# Patient Record
Sex: Female | Born: 1950 | Race: White | Hispanic: No | Marital: Married | Smoking: Never smoker
Health system: Southern US, Community
[De-identification: ages and names within clinical notes are randomized; demographics above are authoritative.]

## PROBLEM LIST (undated history)

## (undated) DIAGNOSIS — E785 Hyperlipidemia, unspecified: Secondary | ICD-10-CM

## (undated) DIAGNOSIS — F329 Major depressive disorder, single episode, unspecified: Secondary | ICD-10-CM

## (undated) DIAGNOSIS — F419 Anxiety disorder, unspecified: Secondary | ICD-10-CM

## (undated) DIAGNOSIS — M1990 Unspecified osteoarthritis, unspecified site: Secondary | ICD-10-CM

## (undated) DIAGNOSIS — L719 Rosacea, unspecified: Secondary | ICD-10-CM

## (undated) DIAGNOSIS — F32A Depression, unspecified: Secondary | ICD-10-CM

## (undated) DIAGNOSIS — C4491 Basal cell carcinoma of skin, unspecified: Secondary | ICD-10-CM

## (undated) DIAGNOSIS — E079 Disorder of thyroid, unspecified: Secondary | ICD-10-CM

## (undated) DIAGNOSIS — T7840XA Allergy, unspecified, initial encounter: Secondary | ICD-10-CM

## (undated) HISTORY — DX: Anxiety disorder, unspecified: F41.9

## (undated) HISTORY — PX: CERVICAL FUSION: SHX112

## (undated) HISTORY — DX: Depression, unspecified: F32.A

## (undated) HISTORY — DX: Rosacea, unspecified: L71.9

## (undated) HISTORY — PX: TUBAL LIGATION: SHX77

## (undated) HISTORY — DX: Unspecified osteoarthritis, unspecified site: M19.90

## (undated) HISTORY — PX: EYE SURGERY: SHX253

## (undated) HISTORY — DX: Disorder of thyroid, unspecified: E07.9

## (undated) HISTORY — PX: SPINE SURGERY: SHX786

## (undated) HISTORY — DX: Basal cell carcinoma of skin, unspecified: C44.91

## (undated) HISTORY — PX: COSMETIC SURGERY: SHX468

## (undated) HISTORY — DX: Hyperlipidemia, unspecified: E78.5

## (undated) HISTORY — DX: Allergy, unspecified, initial encounter: T78.40XA

## (undated) HISTORY — PX: BREAST BIOPSY: SHX20

## (undated) HISTORY — DX: Major depressive disorder, single episode, unspecified: F32.9

## (undated) HISTORY — PX: BREAST EXCISIONAL BIOPSY: SUR124

---
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
## 1990-12-11 HISTORY — PX: ABDOMINAL HYSTERECTOMY: SHX81

## 2000-07-28 ENCOUNTER — Emergency Department (HOSPITAL_COMMUNITY): Admission: EM | Admit: 2000-07-28 | Discharge: 2000-07-28 | Payer: Self-pay

## 2000-08-04 ENCOUNTER — Emergency Department (HOSPITAL_COMMUNITY): Admission: EM | Admit: 2000-08-04 | Discharge: 2000-08-04 | Payer: Self-pay | Admitting: Emergency Medicine

## 2001-04-24 ENCOUNTER — Encounter: Admission: RE | Admit: 2001-04-24 | Discharge: 2001-04-24 | Payer: Self-pay | Admitting: Family Medicine

## 2001-04-24 ENCOUNTER — Encounter: Payer: Self-pay | Admitting: Family Medicine

## 2001-04-30 ENCOUNTER — Encounter: Payer: Self-pay | Admitting: Family Medicine

## 2001-04-30 ENCOUNTER — Encounter: Admission: RE | Admit: 2001-04-30 | Discharge: 2001-04-30 | Payer: Self-pay | Admitting: Family Medicine

## 2001-05-27 ENCOUNTER — Encounter (INDEPENDENT_AMBULATORY_CARE_PROVIDER_SITE_OTHER): Payer: Self-pay | Admitting: Specialist

## 2001-05-27 ENCOUNTER — Ambulatory Visit (HOSPITAL_BASED_OUTPATIENT_CLINIC_OR_DEPARTMENT_OTHER): Admission: RE | Admit: 2001-05-27 | Discharge: 2001-05-27 | Payer: Self-pay | Admitting: Surgery

## 2003-03-04 ENCOUNTER — Encounter: Payer: Self-pay | Admitting: Family Medicine

## 2003-03-04 ENCOUNTER — Encounter: Admission: RE | Admit: 2003-03-04 | Discharge: 2003-03-04 | Payer: Self-pay | Admitting: Family Medicine

## 2004-03-03 ENCOUNTER — Encounter: Admission: RE | Admit: 2004-03-03 | Discharge: 2004-03-03 | Payer: Self-pay | Admitting: Family Medicine

## 2004-03-07 ENCOUNTER — Encounter: Admission: RE | Admit: 2004-03-07 | Discharge: 2004-03-07 | Payer: Self-pay | Admitting: Family Medicine

## 2004-11-25 ENCOUNTER — Ambulatory Visit (HOSPITAL_COMMUNITY): Admission: RE | Admit: 2004-11-25 | Discharge: 2004-11-26 | Payer: Self-pay | Admitting: Neurosurgery

## 2006-04-03 ENCOUNTER — Other Ambulatory Visit: Admission: RE | Admit: 2006-04-03 | Discharge: 2006-04-03 | Payer: Self-pay | Admitting: Family Medicine

## 2006-04-19 ENCOUNTER — Encounter: Admission: RE | Admit: 2006-04-19 | Discharge: 2006-04-19 | Payer: Self-pay | Admitting: Family Medicine

## 2007-05-20 ENCOUNTER — Encounter: Admission: RE | Admit: 2007-05-20 | Discharge: 2007-05-20 | Payer: Self-pay | Admitting: Family Medicine

## 2007-08-07 ENCOUNTER — Other Ambulatory Visit: Admission: RE | Admit: 2007-08-07 | Discharge: 2007-08-07 | Payer: Self-pay | Admitting: Family Medicine

## 2008-06-11 ENCOUNTER — Encounter: Admission: RE | Admit: 2008-06-11 | Discharge: 2008-06-11 | Payer: Self-pay | Admitting: Family Medicine

## 2008-06-23 ENCOUNTER — Encounter: Admission: RE | Admit: 2008-06-23 | Discharge: 2008-06-23 | Payer: Self-pay | Admitting: Family Medicine

## 2009-01-05 ENCOUNTER — Encounter: Admission: RE | Admit: 2009-01-05 | Discharge: 2009-01-05 | Payer: Self-pay | Admitting: Family Medicine

## 2009-01-25 ENCOUNTER — Encounter: Admission: RE | Admit: 2009-01-25 | Discharge: 2009-01-25 | Payer: Self-pay | Admitting: Family Medicine

## 2009-05-26 ENCOUNTER — Other Ambulatory Visit: Admission: RE | Admit: 2009-05-26 | Discharge: 2009-05-26 | Payer: Self-pay | Admitting: Family Medicine

## 2009-07-30 ENCOUNTER — Encounter: Admission: RE | Admit: 2009-07-30 | Discharge: 2009-07-30 | Payer: Self-pay | Admitting: Family Medicine

## 2010-04-29 ENCOUNTER — Ambulatory Visit (HOSPITAL_COMMUNITY): Admission: RE | Admit: 2010-04-29 | Discharge: 2010-04-30 | Payer: Self-pay | Admitting: Neurosurgery

## 2010-08-01 ENCOUNTER — Encounter: Admission: RE | Admit: 2010-08-01 | Discharge: 2010-08-01 | Payer: Self-pay | Admitting: Family Medicine

## 2010-08-10 ENCOUNTER — Encounter: Admission: RE | Admit: 2010-08-10 | Discharge: 2010-08-10 | Payer: Self-pay | Admitting: Family Medicine

## 2011-01-01 ENCOUNTER — Encounter: Payer: Self-pay | Admitting: Family Medicine

## 2011-02-27 LAB — CBC
HCT: 40.2 % (ref 36.0–46.0)
Hemoglobin: 14 g/dL (ref 12.0–15.0)
MCHC: 34.9 g/dL (ref 30.0–36.0)
RDW: 13.3 % (ref 11.5–15.5)

## 2011-02-27 LAB — URINALYSIS, ROUTINE W REFLEX MICROSCOPIC
Bilirubin Urine: NEGATIVE
Hgb urine dipstick: NEGATIVE
Ketones, ur: NEGATIVE mg/dL
Protein, ur: NEGATIVE mg/dL
Urobilinogen, UA: 1 mg/dL (ref 0.0–1.0)

## 2011-02-27 LAB — URINE MICROSCOPIC-ADD ON

## 2011-02-27 LAB — SURGICAL PCR SCREEN
MRSA, PCR: NEGATIVE
Staphylococcus aureus: NEGATIVE

## 2011-04-28 NOTE — Op Note (Signed)
[name], [name]                ACCOUNT NO.:  [id number]   MEDICAL RECORD NO.:  [id number]          PATIENT TYPE:  OIB   LOCATION:  3005                         FACILITY:  [facility]   PHYSICIAN:  Coletta Memos, M.D.     DATE OF BIRTH:  05/03/1951   DATE OF PROCEDURE:  11/25/2004  DATE OF DISCHARGE:  11/26/2004                                 OPERATIVE REPORT   PREOPERATIVE DIAGNOSES:  1.  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  2.  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  3.  Cervical radiculopathy.   POSTOPERATIVE DIAGNOSES:  1.  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  2.  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  3.  Cervical radiculopathy.   PROCEDURE:  Anterior cervical decompression, C4-5, C5-6.  Arthrodesis, C5-6,  with 6 mm allograft x2 on DBX putty and anterior plating 30 mm _________  plate.  Two screws in C5, 6, and 4.   SURGEON:  Coletta Memos, M.D.   ASSISTANT:  Hewitt Shorts, M.D.   ANESTHESIA:  General endotracheal.   INDICATIONS:  [name] is a 60 year old woman who has had severe pain in  the left upper extremity along with weakness initially, which has improved,  but the pain has persisted.  I followed her for approximately a year and a  half, and she at this point would like to proceed with an operation.  I have  recommended, and she has agreed to undergo an ACDF at C4-5 and C5-6.  She is  hypermobile at C4-5, is not unstable, but she certainly has some ligamentous  embarrassment at that level, and C5-6 is certainly spondylotic.   OPERATIVE NOTE:  [name] was brought to the operating room, intubated,  and placed under general anesthetic without difficulty.  She is positioned  with her head neutral on a head rest.  Her neck was prepped, and she was  draped in a sterile fashion.  I infiltrated with 0.5% lidocaine and 1:200  strength epinephrine of about 3 cc into the proposed incision and started  from the midline and the medial border of the left sternocleidomastoid.  I  opened with a  #10 blade and took this down to the platysma.  I then divided  the platysma in a horizontal fashion.  I opened in an avascular plane to the  anterior cervical spine.  After exposing it, placed a spinal needle.  It  appeared the needle was at C4-5.  I then opened the disk space of C5-6 and  at C4-5.  I placed the traction pins, one at C5 and the other at C6.  I  distracted the disk space and proceeded with a diskectomy.  I completed the  diskectomy and used a high-speed drill to drill away some osteophytes and  open up the neural foramen, especially on the left side, but the right side  also.  When I had achieved decompression with both C6 nerve roots, I then  placed a 6 mm allograft.  I then removed the distraction pin at C6 and  placed it at C4.  I opened the disk space with a #15 blade, removed the disk  material,  went to grasp the fascia, using curettes, pituitary rongeurs, and  Harrison punches.  I used a high-speed drill to remove some very minor  osteophytes.  I then cleared this out with Dr. Earl Gala assistance.  The  spinal cord was well decompressed.  At C4-5, really just wanted to achieve a  good surface perfusion.  I then placed a 6 mm allograft at that level.  I  then with Dr. Earl Gala assistance, placed a two-level plate, two screws at  C5, two at C4, two at C6.  Each screw was placed first by drilling a hole  and then using tap and screws, ___________ plate 30 mm, 14 mm screws used at  each site.  Actually showed the plate, screws, and plugs to be in good  position.  Then irrigated the wound.  Then closed the wound in a layered  fascia using Vicryl sutures, clear Dermabond used for sterile dressing.  The  patient tolerated the procedure well.       KC/MEDQ  D:  11/25/2004  T:  11/27/2004  Job:  [id number]

## 2013-09-11 ENCOUNTER — Ambulatory Visit: Payer: Self-pay | Admitting: Otolaryngology

## 2014-07-30 ENCOUNTER — Ambulatory Visit (INDEPENDENT_AMBULATORY_CARE_PROVIDER_SITE_OTHER): Payer: Managed Care, Other (non HMO) | Admitting: Internal Medicine

## 2014-07-30 ENCOUNTER — Encounter: Payer: Self-pay | Admitting: Internal Medicine

## 2014-07-30 VITALS — BP 106/66 | HR 65 | Temp 98.6°F | Ht 66.0 in | Wt 151.0 lb

## 2014-07-30 DIAGNOSIS — F329 Major depressive disorder, single episode, unspecified: Secondary | ICD-10-CM

## 2014-07-30 DIAGNOSIS — F32A Depression, unspecified: Secondary | ICD-10-CM

## 2014-07-30 DIAGNOSIS — F341 Dysthymic disorder: Secondary | ICD-10-CM

## 2014-07-30 DIAGNOSIS — F419 Anxiety disorder, unspecified: Secondary | ICD-10-CM

## 2014-07-30 DIAGNOSIS — E039 Hypothyroidism, unspecified: Secondary | ICD-10-CM

## 2014-07-30 MED ORDER — LEVOTHYROXINE SODIUM 88 MCG PO TABS: 88.0000 ug | ORAL_TABLET | Freq: Every day | ORAL | Status: DC

## 2014-07-30 NOTE — Patient Instructions (Addendum)
Hypothyroidism The thyroid is a large gland located in the lower front of your neck. The thyroid gland helps control metabolism. Metabolism is how your body handles food. It controls metabolism with the hormone thyroxine. When this gland is underactive (hypothyroid), it produces too little hormone.  CAUSES These include:   Absence or destruction of thyroid tissue.  Goiter due to iodine deficiency.  Goiter due to medications.  Congenital defects (since birth).  Problems with the pituitary. This causes a lack of TSH (thyroid stimulating hormone). This hormone tells the thyroid to turn out more hormone. SYMPTOMS  Lethargy (feeling as though you have no energy)  Cold intolerance  Weight gain (in spite of normal food intake)  Dry skin  Coarse hair  Menstrual irregularity (if severe, may lead to infertility)  Slowing of thought processes Cardiac problems are also caused by insufficient amounts of thyroid hormone. Hypothyroidism in the newborn is cretinism, and is an extreme form. It is important that this form be treated adequately and immediately or it will lead rapidly to retarded physical and mental development. DIAGNOSIS  To prove hypothyroidism, your caregiver may do blood tests and ultrasound tests. Sometimes the signs are hidden. It may be necessary for your caregiver to watch this illness with blood tests either before or after diagnosis and treatment. TREATMENT  Low levels of thyroid hormone are increased by using synthetic thyroid hormone. This is a safe, effective treatment. It usually takes about four weeks to gain the full effects of the medication. After you have the full effect of the medication, it will generally take another four weeks for problems to leave. Your caregiver may start you on low doses. If you have had heart problems the dose may be gradually increased. It is generally not an emergency to get rapidly to normal. HOME CARE INSTRUCTIONS   Take your  medications as your caregiver suggests. Let your caregiver know of any medications you are taking or start taking. Your caregiver will help you with dosage schedules.  As your condition improves, your dosage needs may increase. It will be necessary to have continuing blood tests as suggested by your caregiver.  Report all suspected medication side effects to your caregiver. SEEK MEDICAL CARE IF: Seek medical care if you develop:  Sweating.  Tremulousness (tremors).  Anxiety.  Rapid weight loss.  Heat intolerance.  Emotional swings.  Diarrhea.  Weakness. SEEK IMMEDIATE MEDICAL CARE IF:  You develop chest pain, an irregular heart beat (palpitations), or a rapid heart beat. MAKE SURE YOU:   Understand these instructions.  Will watch your condition.  Will get help right away if you are not doing well or get worse. Document Released: 11/27/2005 Document Revised: 02/19/2012 Document Reviewed: 07/17/2008 Exit[employer] Patient Information 2015 [employer]. This information is not intended to replace advice given to you by your health care provider. Make sure you discuss any questions you have with your health care provider.  

## 2014-07-30 NOTE — Progress Notes (Signed)
Pre visit review using our clinic review tool, if applicable. No additional management support is needed unless otherwise documented below in the visit note. 

## 2014-07-30 NOTE — Assessment & Plan Note (Signed)
Well controlled on current dose of synthroid Will refill medication today  RTC in 6 months to recheck TSH

## 2014-07-30 NOTE — Assessment & Plan Note (Signed)
Well controlled She thinks she is ready to wean down some of her medications but wants to wait until she is out of her refills Rare clonazepam use

## 2014-07-30 NOTE — Progress Notes (Signed)
HPI  Pt presents to the clinic today establish care. She is transferring care from Dr. Jonny Ruiz. She has no concerns today.  Flu: never Tetanus: < 10 years ago Zostovax: never Pap Smear: 1992- hysterectomy Mammogram: > 2 years ago Colon Screening: unsure, within the last 71 years-[facility]: yearly- corneal lesions (chronic keratitis) Dentist: biannually  Past Medical History  Diagnosis Date  . Depression   . Thyroid disease     Current Outpatient Prescriptions  Medication Sig Dispense Refill  . acyclovir (ZOVIRAX) 200 MG capsule Take 200 mg by mouth as needed.      Marland Kitchen buPROPion (WELLBUTRIN XL) 300 MG 24 hr tablet Take 300 mg by mouth daily.      . clonazePAM (KLONOPIN) 0.5 MG tablet Take 0.5 mg by mouth as needed for anxiety.      Marland Kitchen FLUoxetine (PROZAC) 40 MG capsule Take 40 mg by mouth daily.      Marland Kitchen levothyroxine (SYNTHROID, LEVOTHROID) 88 MCG tablet Take 88 mcg by mouth daily before breakfast.       No current facility-administered medications for this visit.    Allergies  Allergen Reactions  . Codeine Hives    Family History  Problem Relation Age of Onset  . Cancer Mother     Uterine, Ovarian  . Heart disease Father   . Stroke Father   . Diabetes Father   . Cancer Maternal Uncle     Ovarian,Uterine    History   Social History  . Marital Status: Married    Spouse Name: N/A    Number of Children: N/A  . Years of Education: N/A   Occupational History  . Not on file.   Social History Main Topics  . Smoking status: Never Smoker   . Smokeless tobacco: Never Used  . Alcohol Use: Yes     Comment: 2-3 nights week--1 glass of wine  . Drug Use: Not on file  . Sexual Activity: Not on file   Other Topics Concern  . Not on file   Social History Narrative  . No narrative on file    ROS:  Constitutional: Denies fever, malaise, fatigue, headache or abrupt weight changes.  Respiratory: Denies difficulty breathing, shortness of breath, cough or  sputum production.   Cardiovascular: Denies chest pain, chest tightness, palpitations or swelling in the hands or feet.  Neurological: Denies dizziness, difficulty with memory, difficulty with speech or problems with balance and coordination.   No other specific complaints in a complete review of systems (except as listed in HPI above).  PE:  BP 106/66  Pulse 65  Temp(Src) 98.6 F (37 C) (Oral)  Ht 5\' 6"  (1.676 m)  Wt 151 lb (68.493 kg)  BMI 24.38 kg/m2  SpO2 98% Wt Readings from Last 3 Encounters:  07/30/14 151 lb (68.493 kg)    General: Appears her stated age, well developed, well nourished in NAD. Cardiovascular: Normal rate and rhythm. S1,S2 noted.  No murmur, rubs or gallops noted. No JVD or BLE edema. No carotid bruits noted. Pulmonary/Chest: Normal effort and positive vesicular breath sounds. No respiratory distress. No wheezes, rales or ronchi noted.  Psychiatric: Mood and affect normal. Behavior is normal. Judgment and thought content normal.      Assessment and Plan:

## 2014-12-11 HISTORY — PX: REDUCTION MAMMAPLASTY: SUR839

## 2014-12-26 ENCOUNTER — Other Ambulatory Visit: Payer: Self-pay | Admitting: Internal Medicine

## 2015-01-19 ENCOUNTER — Ambulatory Visit: Payer: Self-pay | Admitting: Internal Medicine

## 2015-01-20 ENCOUNTER — Encounter: Payer: Self-pay | Admitting: Internal Medicine

## 2015-01-25 ENCOUNTER — Ambulatory Visit: Payer: Self-pay | Admitting: Internal Medicine

## 2015-01-26 ENCOUNTER — Encounter: Payer: Self-pay | Admitting: Internal Medicine

## 2015-02-01 ENCOUNTER — Ambulatory Visit (INDEPENDENT_AMBULATORY_CARE_PROVIDER_SITE_OTHER): Payer: Managed Care, Other (non HMO) | Admitting: Internal Medicine

## 2015-02-01 ENCOUNTER — Ambulatory Visit: Payer: Managed Care, Other (non HMO) | Admitting: Internal Medicine

## 2015-02-01 ENCOUNTER — Encounter: Payer: Self-pay | Admitting: Internal Medicine

## 2015-02-01 VITALS — BP 118/76 | HR 68 | Temp 98.7°F | Wt 154.0 lb

## 2015-02-01 DIAGNOSIS — R921 Mammographic calcification found on diagnostic imaging of breast: Secondary | ICD-10-CM

## 2015-02-01 DIAGNOSIS — E039 Hypothyroidism, unspecified: Secondary | ICD-10-CM

## 2015-02-01 NOTE — Progress Notes (Signed)
Pre visit review using our clinic review tool, if applicable. No additional management support is needed unless otherwise documented below in the visit note. 

## 2015-02-01 NOTE — Assessment & Plan Note (Signed)
No issues on current dose of synthriod Will check TSH and T4 today Will adjust if needed Will refill once results are reviewed

## 2015-02-01 NOTE — Patient Instructions (Signed)
Hypothyroidism The thyroid is a large gland located in the lower front of your neck. The thyroid gland helps control metabolism. Metabolism is how your body handles food. It controls metabolism with the hormone thyroxine. When this gland is underactive (hypothyroid), it produces too little hormone.  CAUSES These include:   Absence or destruction of thyroid tissue.  Goiter due to iodine deficiency.  Goiter due to medications.  Congenital defects (since birth).  Problems with the pituitary. This causes a lack of TSH (thyroid stimulating hormone). This hormone tells the thyroid to turn out more hormone. SYMPTOMS  Lethargy (feeling as though you have no energy)  Cold intolerance  Weight gain (in spite of normal food intake)  Dry skin  Coarse hair  Menstrual irregularity (if severe, may lead to infertility)  Slowing of thought processes Cardiac problems are also caused by insufficient amounts of thyroid hormone. Hypothyroidism in the newborn is cretinism, and is an extreme form. It is important that this form be treated adequately and immediately or it will lead rapidly to retarded physical and mental development. DIAGNOSIS  To prove hypothyroidism, your caregiver may do blood tests and ultrasound tests. Sometimes the signs are hidden. It may be necessary for your caregiver to watch this illness with blood tests either before or after diagnosis and treatment. TREATMENT  Low levels of thyroid hormone are increased by using synthetic thyroid hormone. This is a safe, effective treatment. It usually takes about four weeks to gain the full effects of the medication. After you have the full effect of the medication, it will generally take another four weeks for problems to leave. Your caregiver may start you on low doses. If you have had heart problems the dose may be gradually increased. It is generally not an emergency to get rapidly to normal. HOME CARE INSTRUCTIONS   Take your  medications as your caregiver suggests. Let your caregiver know of any medications you are taking or start taking. Your caregiver will help you with dosage schedules.  As your condition improves, your dosage needs may increase. It will be necessary to have continuing blood tests as suggested by your caregiver.  Report all suspected medication side effects to your caregiver. SEEK MEDICAL CARE IF: Seek medical care if you develop:  Sweating.  Tremulousness (tremors).  Anxiety.  Rapid weight loss.  Heat intolerance.  Emotional swings.  Diarrhea.  Weakness. SEEK IMMEDIATE MEDICAL CARE IF:  You develop chest pain, an irregular heart beat (palpitations), or a rapid heart beat. MAKE SURE YOU:   Understand these instructions.  Will watch your condition.  Will get help right away if you are not doing well or get worse. Document Released: 11/27/2005 Document Revised: 02/19/2012 Document Reviewed: 07/17/2008 Exit[employer] Patient Information 2015 [employer]. This information is not intended to replace advice given to you by your health care provider. Make sure you discuss any questions you have with your health care provider.  

## 2015-02-01 NOTE — Progress Notes (Signed)
   Subjective:    Patient ID: [name], female    DOB: Jun 19, 1951, 64 y.o.   MRN: [id number]  HPI [name] is a 64 year old female who presents today wanting to discuss her mammogram results and follow up on her thyroid levels.  She recently had a diagnostic mammogram done that showed benign right breast calcifications.  She will need a follow up mammogram in 6 months.     Review of Systems  Constitutional: Negative for fever, chills and fatigue.  HENT: Negative for congestion, postnasal drip and rhinorrhea.   Respiratory: Negative for cough, shortness of breath and wheezing.   Cardiovascular: Negative for chest pain, palpitations and leg swelling.  Musculoskeletal: Negative for arthralgias, gait problem and neck pain.  Skin: Negative for color change, pallor, rash and wound.  Neurological: Negative for dizziness, light-headedness and headaches.   Past Medical History  Diagnosis Date  . Depression   . Thyroid disease    Family History  Problem Relation Age of Onset  . Cancer Mother     Uterine, Ovarian  . Heart disease Father   . Stroke Father   . Diabetes Father   . Cancer Maternal Grandmother    Current Outpatient Prescriptions on File Prior to Visit  Medication Sig Dispense Refill  . acyclovir (ZOVIRAX) 200 MG capsule Take 200 mg by mouth as needed.    Marland Kitchen buPROPion (WELLBUTRIN XL) 300 MG 24 hr tablet Take 300 mg by mouth daily.    . clonazePAM (KLONOPIN) 0.5 MG tablet Take 0.5 mg by mouth as needed for anxiety.    Marland Kitchen FLUoxetine (PROZAC) 40 MG capsule Take 40 mg by mouth daily.    Marland Kitchen levothyroxine (SYNTHROID, LEVOTHROID) 88 MCG tablet Take 1 tablet (88 mcg total) by mouth daily before breakfast. *PATIENT MUST MAKE AN APPOINTMENT FOR ANY FURTHER REFILLS [phone]* 90 tablet 0   No current facility-administered medications on file prior to visit.       Objective:   Physical Exam  Constitutional: She is oriented to person, place, and time. She appears well-developed  and well-nourished.  HENT:  Head: Normocephalic and atraumatic.  Neck: Normal range of motion. Neck supple. No thyromegaly present.  Cardiovascular: Normal rate, regular rhythm and normal heart sounds.   Pulmonary/Chest: Effort normal and breath sounds normal.  Musculoskeletal: Normal range of motion.  Neurological: She is alert and oriented to person, place, and time.  Skin: Skin is warm and dry.    BP 118/76 mmHg  Pulse 68  Temp(Src) 98.7 F (37.1 C) (Oral)  Wt 154 lb (69.854 kg)  SpO2 98%       Assessment & Plan:  1. Right breast calcifications - Advised patient to continue with plan to have follow up mammogram in 6 months.   2. Hypothyroidism - Will check TSH today and contact patient if medication adjustment is needed.

## 2015-02-01 NOTE — Progress Notes (Signed)
Subjective:    Patient ID: [name], female    DOB: Jun 15, 1951, 64 y.o.   MRN: [id number]  HPI  Pt presents to the clinic today for 6 month follow up of hypothyroidism. She denies any s/s of hypothyroidism including weight gain, hair thinning, abnormal cold sensation or constipation. She is taking the medication as directed without side effects  Additionally, she wants to discuss her most recent mammogram from 01/19/15 which showed calcifications in the right breast. She had a follow up diagnostic mammogram of the right breast which showed likely benign right breast calcifications. She opted to do the short term follow up in 6 months instead of the core needle biopsy and wants to make sure she made the right decision.  Review of Systems      Past Medical History  Diagnosis Date  . Depression   . Thyroid disease     Current Outpatient Prescriptions  Medication Sig Dispense Refill  . acyclovir (ZOVIRAX) 200 MG capsule Take 200 mg by mouth as needed.    Marland Kitchen buPROPion (WELLBUTRIN XL) 300 MG 24 hr tablet Take 300 mg by mouth daily.    . clonazePAM (KLONOPIN) 0.5 MG tablet Take 0.5 mg by mouth as needed for anxiety.    Marland Kitchen FLUoxetine (PROZAC) 40 MG capsule Take 40 mg by mouth daily.    Marland Kitchen levothyroxine (SYNTHROID, LEVOTHROID) 88 MCG tablet Take 1 tablet (88 mcg total) by mouth daily before breakfast. *PATIENT MUST MAKE AN APPOINTMENT FOR ANY FURTHER REFILLS [phone]* 90 tablet 0   No current facility-administered medications for this visit.    Allergies  Allergen Reactions  . Codeine Hives    Family History  Problem Relation Age of Onset  . Cancer Mother     Uterine, Ovarian  . Heart disease Father   . Stroke Father   . Diabetes Father   . Cancer Maternal Grandmother     History   Social History  . Marital Status: Married    Spouse Name: N/A  . Number of Children: N/A  . Years of Education: N/A   Occupational History  . Not on file.   Social History Main Topics    . Smoking status: Never Smoker   . Smokeless tobacco: Never Used  . Alcohol Use: Yes     Comment: 2-3 nights week--1 glass of wine  . Drug Use: No  . Sexual Activity: Yes   Other Topics Concern  . Not on file   Social History Narrative     Constitutional: Denies fever, malaise, fatigue, headache or abrupt weight changes.  Respiratory: Denies difficulty breathing, shortness of breath, cough or sputum production.   Cardiovascular: Denies chest pain, chest tightness, palpitations or swelling in the hands or feet.  Gastrointestinal: Denies abdominal pain, bloating, constipation, diarrhea or blood in the stool.  Skin: Denies redness, rashes, lesions or ulcercations.  Neurological: Denies dizziness, difficulty with memory, difficulty with speech or problems with balance and coordination.   No other specific complaints in a complete review of systems (except as listed in HPI above).  Objective:   Physical Exam   BP 118/76 mmHg  Pulse 68  Temp(Src) 98.7 F (37.1 C) (Oral)  Wt 154 lb (69.854 kg)  SpO2 98% Wt Readings from Last 3 Encounters:  02/01/15 154 lb (69.854 kg)  07/30/14 151 lb (68.493 kg)    General: Appears her stated age, well developed, well nourished in NAD. Skin: Warm, dry and intact. No rashes, lesions or ulcerations noted. Cardiovascular: Normal rate  and rhythm. S1,S2 noted.  No murmur, rubs or gallops noted.  Pulmonary/Chest: Normal effort and positive vesicular breath sounds. No respiratory distress. No wheezes, rales or ronchi noted.  Neurological: Alert and oriented.  Psychiatric: Mood and affect normal. Behavior is normal. Judgment and thought content normal.       Assessment & Plan:   Right breast calcifications:  Reviewed screening and diagnostic mammogram Reassurance given I think the best course of action would be short term follow [place] will contact pt to schedule follow up mammogram  RTC in 6 months or sooner if needed

## 2015-02-02 ENCOUNTER — Other Ambulatory Visit: Payer: Self-pay | Admitting: Internal Medicine

## 2015-02-02 LAB — TSH: TSH: 1.06 u[IU]/mL (ref 0.450–4.500)

## 2015-02-02 LAB — T4, FREE: FREE T4: 1.24 ng/dL (ref 0.82–1.77)

## 2015-02-02 MED ORDER — LEVOTHYROXINE SODIUM 88 MCG PO TABS: 88.0000 ug | ORAL_TABLET | Freq: Every day | ORAL | Status: DC

## 2015-04-02 NOTE — Op Note (Signed)
PATIENT NAMEVALERY, [name] MR#:  [id number] DATE OF BIRTH:  [birth year]  DATE OF PROCEDURE:  09/11/2013  PREOPERATIVE DIAGNOSIS: Cosmetic nasal deformity.   POSTOPERATIVE DIAGNOSIS: Cosmetic nasal deformity.   PROCEDURE: Cosmetic add-on rhinoplasty.   SURGEON: Janalee Dane, M.D.   DESCRIPTION OF PROCEDURE: During septoplasty and bilateral submucous resection the inferior turbinates which was approached through a transcolumellar inverted V incision connected with marginal incisions bilaterally. A subnasal SMAS plane was elevated to the keystone area and subperiosteal elevation over the bony dorsum with a Academic librarian. The skin SMAS envelope was retracted with a Converse retractor and septum was approached through the left superior septum. Please see the functional dictation for that portion of the procedure. The hump was carefully estimated, palpated and under direct visualization #15 blade was used to remove a two-thirds/one-third cartilaginous bony hump. No rasping was necessary. The upper lateral cartilages were reapproximated over the septum, taking care to avoid trapping any mucosa. The conservative cephalic trim was carried out and the cartilage from cephalic trim was used to reinforce the caudal margin of the lower lateral cartilage. This was secured with a horizontal mattress 5-0 PDS suture. Intradermal sutures were then placed in a horizontal mattress fashion using 5-0 PDS suture. There was asymmetry in the tip cartilage, especially on the left side, which required gentle bruising using a Brown-Adson forceps. The medial crura at intradermal region was sutured with 5-0 PDS suture and very gentle trimming of the caudal margin of the medial crura was carried out with a 15 blade. The left medial crural footplate, which was asymmetrically impinging in the nasal airway, was then removed with a 15 blade approximately 2 mm. This was closed with 7-0 nylon. Satisfactory positioning of the tip and  dorsum was then completed with medial and high-low-high lateral osteotomies The lateral osteotomies were carried out after closure of the skin envelope with 7-0 nylon and the transcolumellar incision and 5-0 chromic in the marginal incision. The nasal dorsum was secured with Mastisol, half-inch paper tape and Aquaplast dressing. Temporary Telfa pledgets were placed in each side of the nose, after Surgiflo was then placed as per the functional portion of the procedure.  There were no complications. Estimated blood loss during the cosmetic portion was negligible    ____________________________ J. Nadeen Landau, MD jmc:cc D: 09/11/2013 21:09:32 ET T: 09/11/2013 21:55:33 ET JOB#: [id number]  cc: Janalee Dane, MD, <Dictator> Nicholos Johns MD ELECTRONICALLY SIGNED 09/15/2013 8:38

## 2015-04-02 NOTE — Op Note (Signed)
PATIENT NAMEBELLAH, [name] MR#:  [id number] DATE OF BIRTH:  [birth year]  DATE OF PROCEDURE:  09/11/2013  SURGEON:  Janalee Dane, M.D.  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   PROCEDURES: 1.  Septoplasty.  2.  Bilateral submucous resection of the inferior turbinates.   ANESTHESIA:  General  FINDINGS:  The septum was primarily deviated superiorly to the left. Inferiorly, the septal cartilage was also dislocated off of the maxillary crest to the left. There was a bony spur in the right posterior junction between the vomer and perpendicular plate of the ethmoid. The open approach was used and a generous 1.7 cm L-shaped strut was maintained.   DESCRIPTION OF PROCEDURE:  The patient was identified in the holding area and was brought back to the operating room in the supine position on the operating room table.  After general endotracheal anesthesia had been induced the patient was turned 90 degrees counter clockwise from anesthesia.  The nose was anesthetized with infraorbital nerve blocks and septal injection with 0.5% Lidocaine and 0.25% Bupivacaine mixed with 1:150,000 with Epinephrine and phenylephrine Lidocaine soaked pledgets, two on each side were placed and the face was prepped and draped in the usual fashion.  The pledgets were removed.  A 15 blade was used to make a transcolumellar and marginal incisions, and the skin/SMAS envelope was carefully elevated and retracted.  After disarticulation of the upper lateral cartilages from the dorsal septum, the septoplasty was approached from the left side of the dorsum, and septal mucoperichondrial mucoperiosteal leaflets elevated.  There was a large inferior spur that was resected with Jansen-Middleton forceps.  The remaining septum was deviated back  and forth in an accordion like fashion.  The bony cartilaginous junction was then divided and a small section of rightward-deviated vomer and perpendicular plate was taken down with Jansen-Middleton forceps, releasing the tension on the remaining septum.  The septum then swung back into the midline.  The septal leaflets were closed with quilting 4-0 chromic suture.  The transcolumellar and marginal incisions were closed with 7-0 nylon and 5-0 chromic sutures, respectively.    Attention was directed to the turbinates which had been previously injected on the left.  The head of the inferior turbinate on the left was incised with a 15 blade and the medial mucoperiosteum was elevated using a Psychologist, educational.  Once this had been elevated Knight scissors were used to resect the conchal bone and lateral mucoperiosteum.  The inferior margin of the remaining mucoperiosteum was then cauterized with suction cautery and Surgiflo was placed at the inferior to the inferior margin of the remaining inferior turbinate.  An identical procedure was performed on the right inferior turbinate with once again placement of Surgiflo along its inferior margin.  Temporary Telfa pledgets were then placed.  The patient was allowed to emerge from anesthesia, extubated in the operating room and taken to the recovery room in stable condition.  There were no complications.  Estimated blood loss was less than 10 milliliters.      ____________________________ Lenna Sciara. Nadeen Landau, MD jmc:dmm D: 09/11/2013 21:02:54 ET T: 09/11/2013 21:27:55 ET JOB#: [id number]  cc: Janalee Dane, MD, <Dictator> Nicholos Johns MD ELECTRONICALLY SIGNED 09/15/2013 8:36

## 2015-04-07 ENCOUNTER — Other Ambulatory Visit: Payer: Self-pay | Admitting: *Deleted

## 2015-04-07 MED ORDER — ACYCLOVIR 200 MG PO CAPS: 200.0000 mg | ORAL_CAPSULE | ORAL | Status: DC | PRN

## 2015-04-07 NOTE — Telephone Encounter (Signed)
Previously filled by Dr. Justin Mend, per patient.  She would like this to be ordered from her PCP.  Okay to refill?

## 2015-04-20 ENCOUNTER — Telehealth: Payer: Self-pay

## 2015-04-20 MED ORDER — ACYCLOVIR 200 MG PO CAPS: 200.0000 mg | ORAL_CAPSULE | ORAL | Status: DC | PRN

## 2015-04-20 NOTE — Telephone Encounter (Signed)
[facility] left v/m requesting verification of frequency to take med. Use ref # [id number].

## 2015-04-20 NOTE — Telephone Encounter (Signed)
200 mg po daily prn

## 2015-04-21 NOTE — Telephone Encounter (Signed)
Spoke to [facility] and confirmed directions as instructed

## 2015-04-27 ENCOUNTER — Ambulatory Visit (INDEPENDENT_AMBULATORY_CARE_PROVIDER_SITE_OTHER): Payer: Managed Care, Other (non HMO) | Admitting: Internal Medicine

## 2015-04-27 ENCOUNTER — Encounter: Payer: Self-pay | Admitting: Internal Medicine

## 2015-04-27 VITALS — BP 118/72 | HR 73 | Temp 99.0°F | Wt 153.0 lb

## 2015-04-27 DIAGNOSIS — K1379 Other lesions of oral mucosa: Secondary | ICD-10-CM | POA: Diagnosis not present

## 2015-04-27 DIAGNOSIS — M7062 Trochanteric bursitis, left hip: Secondary | ICD-10-CM | POA: Diagnosis not present

## 2015-04-27 DIAGNOSIS — L255 Unspecified contact dermatitis due to plants, except food: Secondary | ICD-10-CM | POA: Diagnosis not present

## 2015-04-27 MED ORDER — TRIAMCINOLONE ACETONIDE 0.5 % EX OINT: 1.0000 "application " | TOPICAL_OINTMENT | Freq: Two times a day (BID) | CUTANEOUS | Status: DC

## 2015-04-27 NOTE — Progress Notes (Addendum)
Subjective:    Patient ID: [name], female    DOB: November 17, 1951, 64 y.o.   MRN: [id number]  HPI  Pt presents to the clinic today with c/o with multiple complaints.  1- She has a rash on her legs that started 10 days ago. It seems to be spreading. The rash is very itchy. She thinks it is poison ivy. She has been working outside pulling weeds and planting plants. She has Tried Benadryl and Calamine lotion with some relief.  2- She has had numerous cold sores around her mouth and now has a ulcer on the side of her tongue. The ulcer is painful but reports it seems to be improving. She has been swishing with Peroxide and taking Zovirax with some relief. She wants to know if there is anything she can do to prevent the cold sores.  3- She also c/o left hip pain. This started 3 weeks ago. She describes the pain as dull and achy but it is sharp and stabbing when she tries to lay on her left side. The pain does radiate down her leg. She denies numbness or tingling int he leg. It seems to hurt worse after she walks. She has tried Ibuprofen and a heating pad with some relief.  Review of Systems      Past Medical History  Diagnosis Date  . Depression   . Thyroid disease     Current Outpatient Prescriptions  Medication Sig Dispense Refill  . acyclovir (ZOVIRAX) 200 MG capsule Take 1 capsule (200 mg total) by mouth as needed. 90 capsule 0  . buPROPion (WELLBUTRIN XL) 300 MG 24 hr tablet Take 300 mg by mouth daily.    . clonazePAM (KLONOPIN) 0.5 MG tablet Take 0.5 mg by mouth as needed for anxiety.    Marland Kitchen FLUoxetine (PROZAC) 40 MG capsule Take 40 mg by mouth daily.    Marland Kitchen levothyroxine (SYNTHROID, LEVOTHROID) 88 MCG tablet Take 1 tablet (88 mcg total) by mouth daily before breakfast. *PATIENT MUST MAKE AN APPOINTMENT FOR ANY FURTHER REFILLS [phone]* 90 tablet 1   No current facility-administered medications for this visit.    Allergies  Allergen Reactions  . Codeine Hives    Family  History  Problem Relation Age of Onset  . Cancer Mother     Uterine, Ovarian  . Heart disease Father   . Stroke Father   . Diabetes Father   . Cancer Maternal Grandmother     History   Social History  . Marital Status: Married    Spouse Name: N/A  . Number of Children: N/A  . Years of Education: N/A   Occupational History  . Not on file.   Social History Main Topics  . Smoking status: Never Smoker   . Smokeless tobacco: Never Used  . Alcohol Use: Yes     Comment: 2-3 nights week--1 glass of wine  . Drug Use: No  . Sexual Activity: Yes   Other Topics Concern  . Not on file   Social History Narrative     Constitutional: Denies fever, malaise, fatigue, headache or abrupt weight changes.  HEENT: Pt reports ulcer on tongue. Denies eye pain, eye redness, ear pain, ringing in the ears, wax buildup, runny nose, nasal congestion, bloody nose, or sore throat. Respiratory: Denies difficulty breathing, shortness of breath, cough or sputum production.   Cardiovascular: Denies chest pain, chest tightness, palpitations or swelling in the hands or feet.  Musculoskeletal: Pt reports left hip pain. Denies decrease in range of  motion, difficulty with gait, muscle pain or joint  swelling.  Skin: Pt reports rash on bilateral legs. Denies redness or ulcercations.    No other specific complaints in a complete review of systems (except as listed in HPI above).  Objective:   Physical Exam   BP 118/72 mmHg  Pulse 73  Temp(Src) 99 F (37.2 C) (Oral)  Wt 153 lb (69.4 kg)  SpO2 98% Wt Readings from Last 3 Encounters:  04/27/15 153 lb (69.4 kg)  02/01/15 154 lb (69.854 kg)  07/30/14 151 lb (68.493 kg)    General: Appears her stated age, well developed, well nourished in NAD. Skin: Warm, dry and intact. Small <1 cm round scattered lesions noted on bilateral legs, resembling contact dermatitis. HEENT: Throat/Mouth: Teeth present, mucosa pink and moist, no exudate. Small < 1 cm round  ulcer noted on tip of tongue, appears to be healing.  Neck: No adenopathy noted. Cardiovascular: Normal rate and rhythm. S1,S2 noted.  No murmur, rubs or gallops noted.  Pulmonary/Chest: Normal effort and positive vesicular breath sounds. No respiratory distress. No wheezes, rales or ronchi noted.  Musculoskeletal: Normal internal and external ROM of the left hip. Pain with palpation over the left trochanter. No difficulty with gait.   CBC    Component Value Date/Time   WBC 10.3 04/29/2010 0749   RBC 4.20 04/29/2010 0749   HGB 14.0 04/29/2010 0749   HCT 40.2 04/29/2010 0749   PLT 129* 04/29/2010 0749   MCV 95.7 04/29/2010 0749   MCHC 34.9 04/29/2010 0749   RDW 13.3 04/29/2010 0749    Hgb A1C No results found for: HGBA1C      Assessment & Plan:   Recurrent oral ulcers:  She does not want to take the Zovirax daily to prevent the cold sores/ulcers She will continue taking the Zovirax on as as needed basis  Left trochanteric bursitis:  Advised her to cut back on the walking for right now She will try Ibuprofen 400 mg Q12H for the next 3 days Continue the heading pad If persist, will refer to Dr. Lorelei Pont for injection into the bursa  Poison Ivy:  eRx for Triamcinolone to affected areas  RTC as needed or if symptoms persist or worsen

## 2015-04-27 NOTE — Patient Instructions (Signed)
Trochanteric Bursitis You have hip pain due to trochanteric bursitis. Bursitis means that the sack near the outside of the hip is filled with fluid and inflamed. This sack is made up of protective soft tissue. The pain from trochanteric bursitis can be severe and keep you from sleep. It can radiate to the buttocks or down the outside of the thigh to the knee. The pain is almost always worse when rising from the seated or lying position and with walking. Pain can improve after you take a few steps. It happens more often in people with hip joint and lumbar spine problems, such as arthritis or previous surgery. Very rarely the trochanteric bursa can become infected, and antibiotics and/or surgery may be needed. Treatment often includes an injection of local anesthetic mixed with cortisone medicine. This medicine is injected into the area where it is most tender over the hip. Repeat injections may be necessary if the response to treatment is slow. You can apply ice packs over the tender area for 30 minutes every 2 hours for the next few days. Anti-inflammatory and/or narcotic pain medicine may also be helpful. Limit your activity for the next few days if the pain continues. See your caregiver in 5-10 days if you are not greatly improved.  SEEK IMMEDIATE MEDICAL CARE IF:  You develop severe pain, fever, or increased redness.  You have pain that radiates below the knee. EXERCISES STRETCHING EXERCISES - Trochanteric Bursitis  These exercises may help you when beginning to rehabilitate your injury. Your symptoms may resolve with or without further involvement from your physician, physical therapist, or athletic trainer. While completing these exercises, remember:   Restoring tissue flexibility helps normal motion to return to the joints. This allows healthier, less painful movement and activity.  An effective stretch should be held for at least 30 seconds.  A stretch should never be painful. You should only  feel a gentle lengthening or release in the stretched tissue. STRETCH - Iliotibial Band  On the floor or bed, lie on your side so your injured leg is on top. Bend your knee and grab your ankle.  Slowly bring your knee back so that your thigh is in line with your trunk. Keep your heel at your buttocks and gently arch your back so your head, shoulders and hips line up.  Slowly lower your leg so that your knee approaches the floor/bed until you feel a gentle stretch on the outside of your thigh. If you do not feel a stretch and your knee will not fall farther, place the heel of your opposite foot on top of your knee and pull your thigh down farther.  Hold this stretch for __________ seconds.  Repeat __________ times. Complete this exercise __________ times per day. STRETCH - Hamstrings, Supine   Lie on your back. Loop a belt or towel over the ball of your foot as shown.  Straighten your knee and slowly pull on the belt to raise your injured leg. Do not allow the knee to bend. Keep your opposite leg flat on the floor.  Raise the leg until you feel a gentle stretch behind your knee or thigh. Hold this position for __________ seconds.  Repeat __________ times. Complete this stretch __________ times per day. STRETCH - Quadriceps, Prone   Lie on your stomach on a firm surface, such as a bed or padded floor.  Bend your knee and grasp your ankle. If you are unable to reach your ankle or pant leg, use a belt   around your foot to lengthen your reach.  Gently pull your heel toward your buttocks. Your knee should not slide out to the side. You should feel a stretch in the front of your thigh and/or knee.  Hold this position for __________ seconds.  Repeat __________ times. Complete this stretch __________ times per day. STRETCHING - Hip Flexors, Lunge Half kneel with your knee on the floor and your opposite knee bent and directly over your ankle.  Keep good posture with your head over your  shoulders. Tighten your buttocks to point your tailbone downward; this will prevent your back from arching too much.  You should feel a gentle stretch in the front of your thigh and/or hip. If you do not feel any resistance, slightly slide your opposite foot forward and then slowly lunge forward so your knee once again lines up over your ankle. Be sure your tailbone remains pointed downward.  Hold this stretch for __________ seconds.  Repeat __________ times. Complete this stretch __________ times per day. STRETCH - Adductors, Lunge  While standing, spread your legs.  Lean away from your injured leg by bending your opposite knee. You may rest your hands on your thigh for balance.  You should feel a stretch in your inner thigh. Hold for __________ seconds.  Repeat __________ times. Complete this exercise __________ times per day. Document Released: 01/04/2005 Document Revised: 04/13/2014 Document Reviewed: 03/11/2009 [employer] Patient Information 2015 [employer]. This information is not intended to replace advice given to you by your health care provider. Make sure you discuss any questions you have with your health care provider.  

## 2015-04-27 NOTE — Progress Notes (Signed)
Pre visit review using our clinic review tool, if applicable. No additional management support is needed unless otherwise documented below in the visit note. 

## 2015-05-13 ENCOUNTER — Telehealth: Payer: Self-pay | Admitting: Internal Medicine

## 2015-05-13 ENCOUNTER — Other Ambulatory Visit: Payer: Self-pay | Admitting: Internal Medicine

## 2015-05-13 DIAGNOSIS — R928 Other abnormal and inconclusive findings on diagnostic imaging of breast: Secondary | ICD-10-CM

## 2015-05-13 NOTE — Telephone Encounter (Signed)
done

## 2015-05-13 NOTE — Telephone Encounter (Signed)
Pt called stating she needs follow up mammogram in aug No order in system  For diagnostic mammorgams   [facility] also wants limited ultra sound right and left before they will schedule   [facility] Can do first 2 weeks in aug early am appointment

## 2015-05-14 ENCOUNTER — Other Ambulatory Visit: Payer: Self-pay | Admitting: Internal Medicine

## 2015-05-14 DIAGNOSIS — R928 Other abnormal and inconclusive findings on diagnostic imaging of breast: Secondary | ICD-10-CM

## 2015-05-14 NOTE — Telephone Encounter (Signed)
Cancelled bilateral mammogram and ordered diagnostic right breast

## 2015-05-14 NOTE — Telephone Encounter (Signed)
i called [facility] to schedule.  The mammogram needs to say diagnostic right not bilateral

## 2015-05-17 NOTE — Telephone Encounter (Signed)
Appointment 8/4 @ 8:20 arrive @ 8:05 Pt aware

## 2015-07-07 ENCOUNTER — Encounter (INDEPENDENT_AMBULATORY_CARE_PROVIDER_SITE_OTHER): Payer: Self-pay

## 2015-07-07 ENCOUNTER — Encounter: Payer: Self-pay | Admitting: Internal Medicine

## 2015-07-07 ENCOUNTER — Ambulatory Visit (INDEPENDENT_AMBULATORY_CARE_PROVIDER_SITE_OTHER): Payer: Managed Care, Other (non HMO) | Admitting: Internal Medicine

## 2015-07-07 VITALS — BP 110/62 | HR 77 | Temp 98.2°F | Wt 153.0 lb

## 2015-07-07 DIAGNOSIS — L259 Unspecified contact dermatitis, unspecified cause: Secondary | ICD-10-CM

## 2015-07-07 MED ORDER — METHYLPREDNISOLONE ACETATE 80 MG/ML IJ SUSP
80.0000 mg | Freq: Once | INTRAMUSCULAR | Status: AC
  Administered 2015-07-07: 80 mg via INTRAMUSCULAR

## 2015-07-07 NOTE — Addendum Note (Signed)
Addended by: Lurlean Nanny on: 07/07/2015 02:58 PM   Modules accepted: Orders

## 2015-07-07 NOTE — Progress Notes (Signed)
Pre visit review using our clinic review tool, if applicable. No additional management support is needed unless otherwise documented below in the visit note. 

## 2015-07-07 NOTE — Progress Notes (Signed)
Subjective:    Patient ID: [name], female    DOB: 05-Dec-1951, 64 y.o.   MRN: [id number]  HPI  Pt presents to the clinic today with c/o a rash. She noticed this 6 days ago. It is located on her left upper thigh. She has noticed it in other areas as well. The rash is very itchy. She has been using leftover Kenalog cream and Benadryl with some relief. It seems to be working except on the area on her left upper thigh. She does work outside a lot and may have gotten into something that irritated her skin. She has not changed her soap, lotion or detergent. No one in her household has a similar rash.  Review of Systems  Past Medical History  Diagnosis Date  . Depression   . Thyroid disease     Current Outpatient Prescriptions  Medication Sig Dispense Refill  . acyclovir (ZOVIRAX) 200 MG capsule Take 1 capsule (200 mg total) by mouth as needed. 90 capsule 0  . buPROPion (WELLBUTRIN XL) 300 MG 24 hr tablet Take 300 mg by mouth daily.    . clonazePAM (KLONOPIN) 0.5 MG tablet Take 0.5 mg by mouth as needed for anxiety.    Marland Kitchen FLUoxetine (PROZAC) 40 MG capsule Take 40 mg by mouth daily.    Marland Kitchen levothyroxine (SYNTHROID, LEVOTHROID) 88 MCG tablet Take 1 tablet (88 mcg total) by mouth daily before breakfast. *PATIENT MUST MAKE AN APPOINTMENT FOR ANY FURTHER REFILLS [phone]* 90 tablet 1  . triamcinolone ointment (KENALOG) 0.5 % Apply 1 application topically 2 (two) times daily. 30 g 0   No current facility-administered medications for this visit.    Allergies  Allergen Reactions  . Codeine Hives    Family History  Problem Relation Age of Onset  . Cancer Mother     Uterine, Ovarian  . Heart disease Father   . Stroke Father   . Diabetes Father   . Cancer Maternal Grandmother     History   Social History  . Marital Status: Married    Spouse Name: N/A  . Number of Children: N/A  . Years of Education: N/A   Occupational History  . Not on file.   Social History Main Topics    . Smoking status: Never Smoker   . Smokeless tobacco: Never Used  . Alcohol Use: 0.0 oz/week    0 Standard drinks or equivalent per week     Comment: 2-3 nights week--1 glass of wine  . Drug Use: No  . Sexual Activity: Yes   Other Topics Concern  . Not on file   Social History Narrative     Constitutional: Denies fever, malaise, fatigue, headache or abrupt weight changes.  Respiratory: Denies difficulty breathing, shortness of breath, cough or sputum production.   Cardiovascular: Denies chest pain, chest tightness, palpitations or swelling in the hands or feet.   Skin: Pt reports rash. Denies ulcercations.   No other specific complaints in a complete review of systems (except as listed in HPI above).     Objective:   Physical Exam   BP 110/62 mmHg  Pulse 77  Temp(Src) 98.2 F (36.8 C) (Oral)  Wt 153 lb (69.4 kg)  SpO2 98% Wt Readings from Last 3 Encounters:  07/07/15 153 lb (69.4 kg)  04/27/15 153 lb (69.4 kg)  02/01/15 154 lb (69.854 kg)    General: Appears her stated age, well developed, well nourished in NAD. Skin: Maculopapular rash in linear form noted on left thigh just  above the knee. Cardiovascular: Normal rate and rhythm. S1,S2 noted.  Pulmonary/Chest: Normal effort and positive vesicular breath sounds. No respiratory distress. No wheezes, rales or ronchi noted.  Neurological: Alert and oriented.    CBC    Component Value Date/Time   WBC 10.3 04/29/2010 0749   RBC 4.20 04/29/2010 0749   HGB 14.0 04/29/2010 0749   HCT 40.2 04/29/2010 0749   PLT 129* 04/29/2010 0749   MCV 95.7 04/29/2010 0749   MCHC 34.9 04/29/2010 0749   RDW 13.3 04/29/2010 0749        Assessment & Plan:  Contact dermatitis:  80 mg Depo IM today Continue Kenalog cream BID Continue Benadryl as needed Wear long pants and long sleeves when working outside If no improvement, will call in Clobetasol cream  RTC as needed or if symptoms persist or worsen

## 2015-07-07 NOTE — Patient Instructions (Addendum)

## 2015-07-15 ENCOUNTER — Ambulatory Visit
Admission: RE | Admit: 2015-07-15 | Discharge: 2015-07-15 | Disposition: A | Discharge status: Home / Self Care | Payer: Managed Care, Other (non HMO) | Source: Ambulatory Visit | Attending: Internal Medicine | Admitting: Internal Medicine

## 2015-07-15 ENCOUNTER — Other Ambulatory Visit: Payer: Self-pay | Admitting: Internal Medicine

## 2015-07-15 ENCOUNTER — Ambulatory Visit: Payer: Managed Care, Other (non HMO)

## 2015-07-15 DIAGNOSIS — Z09 Encounter for follow-up examination after completed treatment for conditions other than malignant neoplasm: Secondary | ICD-10-CM | POA: Diagnosis present

## 2015-07-15 DIAGNOSIS — R921 Mammographic calcification found on diagnostic imaging of breast: Secondary | ICD-10-CM

## 2015-07-15 DIAGNOSIS — R928 Other abnormal and inconclusive findings on diagnostic imaging of breast: Secondary | ICD-10-CM | POA: Diagnosis present

## 2015-07-20 ENCOUNTER — Ambulatory Visit: Admission: RE | Admit: 2015-07-20 | Payer: Managed Care, Other (non HMO) | Source: Ambulatory Visit

## 2015-07-20 ENCOUNTER — Ambulatory Visit
Admission: RE | Admit: 2015-07-20 | Discharge: 2015-07-20 | Disposition: A | Discharge status: Home / Self Care | Payer: Managed Care, Other (non HMO) | Source: Ambulatory Visit | Attending: Internal Medicine | Admitting: Internal Medicine

## 2015-07-20 DIAGNOSIS — R921 Mammographic calcification found on diagnostic imaging of breast: Secondary | ICD-10-CM | POA: Diagnosis present

## 2015-07-20 HISTORY — PX: BREAST BIOPSY: SHX20

## 2015-07-21 LAB — SURGICAL PATHOLOGY

## 2015-08-24 ENCOUNTER — Ambulatory Visit (INDEPENDENT_AMBULATORY_CARE_PROVIDER_SITE_OTHER): Payer: Managed Care, Other (non HMO) | Admitting: Internal Medicine

## 2015-08-24 ENCOUNTER — Encounter: Payer: Self-pay | Admitting: Internal Medicine

## 2015-08-24 ENCOUNTER — Other Ambulatory Visit: Payer: Self-pay | Admitting: Internal Medicine

## 2015-08-24 VITALS — BP 118/78 | HR 72 | Temp 98.6°F | Wt 153.0 lb

## 2015-08-24 DIAGNOSIS — J069 Acute upper respiratory infection, unspecified: Secondary | ICD-10-CM | POA: Diagnosis not present

## 2015-08-24 MED ORDER — AZITHROMYCIN 250 MG PO TABS: ORAL_TABLET | ORAL | Status: DC

## 2015-08-24 NOTE — Progress Notes (Signed)
HPI  Pt presents to the clinic today with c/o cough and chest congestion. This started 10 days ago. The cough is productive of green mucous. She is short of breath with activities. She denies runny nose, nasal congestion or sore throat. She feels like she has run a fever, but denies chills or body aches. The has tried Tylenol, Mucinex and Robitussin with minimal relief. She has no history of allergies or breathing problems. She has had sick contacts. She did recently travel to [place].  Review of Systems      Past Medical History  Diagnosis Date  . Depression   . Thyroid disease     Family History  Problem Relation Age of Onset  . Cancer Mother     Uterine, Ovarian  . Heart disease Father   . Stroke Father   . Diabetes Father   . Cancer Maternal Grandmother     Social History   Social History  . Marital Status: Married    Spouse Name: N/A  . Number of Children: N/A  . Years of Education: N/A   Occupational History  . Not on file.   Social History Main Topics  . Smoking status: Never Smoker   . Smokeless tobacco: Never Used  . Alcohol Use: 0.0 oz/week    0 Standard drinks or equivalent per week     Comment: 2-3 nights week--1 glass of wine  . Drug Use: No  . Sexual Activity: Yes   Other Topics Concern  . Not on file   Social History Narrative    Allergies  Allergen Reactions  . Codeine Hives     Constitutional: Denies headache, fatigue, fever or abrupt weight changes.  HEENT:  Denies eye redness, eye pain, pressure behind the eyes, facial pain, nasal congestion, ear pain, ringing in the ears, wax buildup, runny nose or sore throat. Respiratory: Positive cough and shortness of breath. Denies difficulty breathing.  Cardiovascular: Denies chest pain, chest tightness, palpitations or swelling in the hands or feet.   No other specific complaints in a complete review of systems (except as listed in HPI above).  Objective:   BP 118/78 mmHg  Pulse 72  Temp(Src)  98.6 F (37 C) (Oral)  Wt 153 lb (69.4 kg) Wt Readings from Last 3 Encounters:  08/24/15 153 lb (69.4 kg)  07/07/15 153 lb (69.4 kg)  04/27/15 153 lb (69.4 kg)     General: Appears her stated age, in NAD. HEENT: Head: normal shape and size, no sinus tenderness noted; Eyes: sclera white, no icterus, conjunctiva pink; Ears: Tm's gray and intact, normal light reflex; Nose: mucosa pink and moist, septum midline; Throat/Mouth: Teeth present, mucosa erythematous and moist, no exudate noted, no lesions or ulcerations noted.  Neck: No cervical lymphadenopathy.  Cardiovascular: Normal rate and rhythm. S1,S2 noted.  No murmur, rubs or gallops noted.  Pulmonary/Chest: Normal effort and positive vesicular breath sounds. No respiratory distress. No wheezes, rales or ronchi noted.      Assessment & Plan:   Upper Respiratory Infection:  Get some rest and drink plenty of water Do salt water gargles for the sore throat eRx for Azithromax x 5 days Delsym as needed for cough  RTC as needed or if symptoms persist.

## 2015-08-24 NOTE — Progress Notes (Signed)
Pre visit review using our clinic review tool, if applicable. No additional management support is needed unless otherwise documented below in the visit note. 

## 2015-08-24 NOTE — Progress Notes (Signed)
   Subjective:    Patient ID: [name], female    DOB: 14-Aug-1951, 64 y.o.   MRN: [id number]  HPI [name] is a 64 year old female who presents today with chief complaint of productive cough and malaise for 10 days.  She denies runny nose, congestion and sore throat.  She thinks she has had a fever but has taken Tylenol every 6 hours continually.  She recently took care of her grandson who was diagnosed with bacterial pneumonia.     Review of Systems  Constitutional: Positive for fever. Negative for chills and fatigue.  HENT: Negative for congestion, postnasal drip and sore throat.   Respiratory: Positive for cough. Negative for shortness of breath and wheezing.   Cardiovascular: Negative for chest pain, palpitations and leg swelling.  Genitourinary: Negative.   Neurological: Negative.    Family History  Problem Relation Age of Onset  . Cancer Mother     Uterine, Ovarian  . Heart disease Father   . Stroke Father   . Diabetes Father   . Cancer Maternal Grandmother    Current Outpatient Prescriptions on File Prior to Visit  Medication Sig Dispense Refill  . acyclovir (ZOVIRAX) 200 MG capsule Take 1 capsule (200 mg total) by mouth as needed. 90 capsule 0  . buPROPion (WELLBUTRIN XL) 300 MG 24 hr tablet Take 300 mg by mouth daily.    . clonazePAM (KLONOPIN) 0.5 MG tablet Take 0.5 mg by mouth as needed for anxiety.    Marland Kitchen FLUoxetine (PROZAC) 40 MG capsule Take 40 mg by mouth daily.    Marland Kitchen levothyroxine (SYNTHROID, LEVOTHROID) 88 MCG tablet Take 1 tablet (88 mcg total) by mouth daily before breakfast. *PATIENT MUST MAKE AN APPOINTMENT FOR ANY FURTHER REFILLS [phone]* 90 tablet 1  . triamcinolone ointment (KENALOG) 0.5 % Apply 1 application topically 2 (two) times daily. (Patient not taking: Reported on 08/24/2015) 30 g 0   No current facility-administered medications on file prior to visit.       Objective:   Physical Exam  Constitutional: She is oriented to person, place, and  time. She appears well-developed and well-nourished.  HENT:  Head: Normocephalic and atraumatic.  Right Ear: External ear normal.  Left Ear: External ear normal.  Mouth/Throat: Oropharynx is clear and moist. No oropharyngeal exudate.  Eyes: Pupils are equal, round, and reactive to light.  Neck: Normal range of motion.  Cardiovascular: Normal rate, regular rhythm, normal heart sounds and intact distal pulses.   Pulmonary/Chest: Effort normal and breath sounds normal.  Abdominal: Soft. Bowel sounds are normal.  Musculoskeletal: Normal range of motion.  Lymphadenopathy:    She has no cervical adenopathy.  Neurological: She is alert and oriented to person, place, and time.  Skin: Skin is warm and dry. She is not diaphoretic.   BP 118/78 mmHg  Pulse 72  Temp(Src) 98.6 F (37 C) (Oral)  Wt 153 lb (69.4 kg)        Assessment & Plan:  1. Upper respiratory infection  Rx for Azithromycin.

## 2015-08-24 NOTE — Patient Instructions (Signed)
Cough, Adult  A cough is a reflex that helps clear your throat and airways. It can help heal the body or may be a reaction to an irritated airway. A cough may only last 2 or 3 weeks (acute) or may last more than 8 weeks (chronic).  CAUSES Acute cough:  Viral or bacterial infections. Chronic cough:  Infections.  Allergies.  Asthma.  Post-nasal drip.  Smoking.  Heartburn or acid reflux.  Some medicines.  Chronic lung problems (COPD).  Cancer. SYMPTOMS   Cough.  Fever.  Chest pain.  Increased breathing rate.  High-pitched whistling sound when breathing (wheezing).  Colored mucus that you cough up (sputum). TREATMENT   A bacterial cough may be treated with antibiotic medicine.  A viral cough must run its course and will not respond to antibiotics.  Your caregiver may recommend other treatments if you have a chronic cough. HOME CARE INSTRUCTIONS   Only take over-the-counter or prescription medicines for pain, discomfort, or fever as directed by your caregiver. Use cough suppressants only as directed by your caregiver.  Use a cold steam vaporizer or humidifier in your bedroom or home to help loosen secretions.  Sleep in a semi-upright position if your cough is worse at night.  Rest as needed.  Stop smoking if you smoke. SEEK IMMEDIATE MEDICAL CARE IF:   You have pus in your sputum.  Your cough starts to worsen.  You cannot control your cough with suppressants and are losing sleep.  You begin coughing up blood.  You have difficulty breathing.  You develop pain which is getting worse or is uncontrolled with medicine.  You have a fever. MAKE SURE YOU:   Understand these instructions.  Will watch your condition.  Will get help right away if you are not doing well or get worse. Document Released: 05/26/2011 Document Revised: 02/19/2012 Document Reviewed: 05/26/2011 Exit[employer] Patient Information 2015 [employer]. This information is not intended  to replace advice given to you by your health care provider. Make sure you discuss any questions you have with your health care provider.  

## 2015-08-30 ENCOUNTER — Telehealth: Payer: Self-pay | Admitting: Internal Medicine

## 2015-08-30 NOTE — Telephone Encounter (Signed)
[facility]     Patient Name: [name] Initial Comment Caller she was seen Thursday and completed Z-pack; still coughing up green stuff   DOB: 10-Oct-1951      Nurse Assessment  Nurse: Mechele Dawley, RN, Amy Date/Time [name] Time): 08/30/2015 3:44:59 PM  Confirm and document reason for call. If symptomatic, describe symptoms. ---SHE WAS SEEN ON TUESDAY FOR ACUTE BRONCHITIS. SHE IS NOT HAVING ANY FEVER. SHE IS COUGHING UP GREEN PHLEGM. SHE WAS ON THE ZPACK AND SHE HAS COMPLETED THE ANTIBIOTIC. SHE WANTS TO KNOW IF SHE SHOULD BE ON SOMETHING ELSE. WHEN SHE TRIES TO EXERCISE SHE GETS SOB.  Has the patient traveled out of the country within the last 30 days? ---Not Applicable  Does the patient require triage? ---Yes  Related visit to physician within the last 2 weeks? ---Yes  Does the PT have any chronic conditions? (i.e. diabetes, asthma, etc.) ---Yes  List chronic conditions. ---HYPOTHYRODISIM    Guidelines     Guideline Title Affirmed Question Affirmed Notes   Cough - Acute Productive Cough present > 10 days    Final Disposition User   See PCP When Office is Open (within 3 days) [place], Therapist, sports, Amy       Disagree/Comply: Comply

## 2015-08-31 ENCOUNTER — Ambulatory Visit (INDEPENDENT_AMBULATORY_CARE_PROVIDER_SITE_OTHER): Payer: Managed Care, Other (non HMO) | Admitting: Internal Medicine

## 2015-08-31 ENCOUNTER — Encounter: Payer: Self-pay | Admitting: Internal Medicine

## 2015-08-31 VITALS — BP 124/86 | HR 76 | Temp 98.8°F | Wt 154.0 lb

## 2015-08-31 DIAGNOSIS — R05 Cough: Secondary | ICD-10-CM

## 2015-08-31 DIAGNOSIS — R059 Cough, unspecified: Secondary | ICD-10-CM

## 2015-08-31 NOTE — Progress Notes (Signed)
HPI  Pt presents to the clinic today with c/o ongoing cough and chest congestion. She was seen 9/13 for the same and prescribed a zpack for a URI. She finished all the antibiotics but reports she still has a cough that is productive of light yellow mucous. She is mildly short of breath with exertion but denies chest pain. She denies fever, chills or body aches. She has been taking Delsym and Tylenol with minimal relief.  Review of Systems      Past Medical History  Diagnosis Date  . Depression   . Thyroid disease     Family History  Problem Relation Age of Onset  . Cancer Mother     Uterine, Ovarian  . Heart disease Father   . Stroke Father   . Diabetes Father   . Cancer Maternal Grandmother     Social History   Social History  . Marital Status: Married    Spouse Name: N/A  . Number of Children: N/A  . Years of Education: N/A   Occupational History  . Not on file.   Social History Main Topics  . Smoking status: Never Smoker   . Smokeless tobacco: Never Used  . Alcohol Use: 0.0 oz/week    0 Standard drinks or equivalent per week     Comment: 2-3 nights week--1 glass of wine  . Drug Use: No  . Sexual Activity: Yes   Other Topics Concern  . Not on file   Social History Narrative    Allergies  Allergen Reactions  . Codeine Hives     Constitutional: Denies headache, fatigue, fever or abrupt weight changes.  HEENT:  Denies eye redness, eye pain, pressure behind the eyes, facial pain, nasal congestion, ear pain, ringing in the ears, wax buildup, runny nose or sore throat. Respiratory: Positive cough. Denies difficulty breathing.  Cardiovascular: Denies chest pain, chest tightness, palpitations or swelling in the hands or feet.   No other specific complaints in a complete review of systems (except as listed in HPI above).  Objective:   BP 124/86 mmHg  Pulse 76  Temp(Src) 98.8 F (37.1 C) (Oral)  Wt 154 lb (69.854 kg)  SpO2 98%  Wt Readings from Last 3  Encounters:  08/31/15 154 lb (69.854 kg)  08/24/15 153 lb (69.4 kg)  07/07/15 153 lb (69.4 kg)     General: Appears his stated age, in NAD. HEENT: Head: normal shape and size, no sinus tenderness noted; Eyes: sclera white, no icterus, conjunctiva pink; Ears: Tm's gray and intact, normal light reflex; Nose: mucosa pink and moist, septum midline; Throat/Mouth: + PND. Teeth present, mucosa pink and moist, no exudate noted, no lesions or ulcerations noted.  Neck: No cervical lymphadenopathy.  Cardiovascular: Normal rate and rhythm. S1,S2 noted.  No murmur, rubs or gallops noted.  Pulmonary/Chest: Normal effort and positive vesicular breath sounds. No respiratory distress. No wheezes, rales or ronchi noted.      Assessment & Plan:   Post infectious cough:  Reassurance given that this should resolve in 1-2 weeks Take Allegra daily in case there is a allergy component Continue Delsym Return precautions given  RTC as needed or if symptoms persist.

## 2015-08-31 NOTE — Telephone Encounter (Signed)
Pt has appt 08/31/15 at 11:30 with Webb Silversmith NP.

## 2015-08-31 NOTE — Patient Instructions (Signed)
Cough, Adult  A cough is a reflex that helps clear your throat and airways. It can help heal the body or may be a reaction to an irritated airway. A cough may only last 2 or 3 weeks (acute) or may last more than 8 weeks (chronic).  CAUSES Acute cough:  Viral or bacterial infections. Chronic cough:  Infections.  Allergies.  Asthma.  Post-nasal drip.  Smoking.  Heartburn or acid reflux.  Some medicines.  Chronic lung problems (COPD).  Cancer. SYMPTOMS   Cough.  Fever.  Chest pain.  Increased breathing rate.  High-pitched whistling sound when breathing (wheezing).  Colored mucus that you cough up (sputum). TREATMENT   A bacterial cough may be treated with antibiotic medicine.  A viral cough must run its course and will not respond to antibiotics.  Your caregiver may recommend other treatments if you have a chronic cough. HOME CARE INSTRUCTIONS   Only take over-the-counter or prescription medicines for pain, discomfort, or fever as directed by your caregiver. Use cough suppressants only as directed by your caregiver.  Use a cold steam vaporizer or humidifier in your bedroom or home to help loosen secretions.  Sleep in a semi-upright position if your cough is worse at night.  Rest as needed.  Stop smoking if you smoke. SEEK IMMEDIATE MEDICAL CARE IF:   You have pus in your sputum.  Your cough starts to worsen.  You cannot control your cough with suppressants and are losing sleep.  You begin coughing up blood.  You have difficulty breathing.  You develop pain which is getting worse or is uncontrolled with medicine.  You have a fever. MAKE SURE YOU:   Understand these instructions.  Will watch your condition.  Will get help right away if you are not doing well or get worse. Document Released: 05/26/2011 Document Revised: 02/19/2012 Document Reviewed: 05/26/2011 Exit[employer] Patient Information 2015 [employer]. This information is not intended  to replace advice given to you by your health care provider. Make sure you discuss any questions you have with your health care provider.  

## 2015-08-31 NOTE — Progress Notes (Signed)
Pre visit review using our clinic review tool, if applicable. No additional management support is needed unless otherwise documented below in the visit note. 

## 2015-09-14 ENCOUNTER — Other Ambulatory Visit: Payer: Self-pay | Admitting: Plastic Surgery

## 2015-12-12 ENCOUNTER — Other Ambulatory Visit: Payer: Self-pay | Admitting: Internal Medicine

## 2015-12-14 NOTE — Telephone Encounter (Signed)
Last filled 04/20/2015--Please advise

## 2015-12-14 NOTE — Telephone Encounter (Signed)
Sent electronically 

## 2015-12-23 ENCOUNTER — Encounter: Payer: Self-pay | Admitting: Internal Medicine

## 2015-12-23 ENCOUNTER — Ambulatory Visit (INDEPENDENT_AMBULATORY_CARE_PROVIDER_SITE_OTHER): Payer: Managed Care, Other (non HMO) | Admitting: Internal Medicine

## 2015-12-23 VITALS — BP 118/78 | HR 74 | Temp 97.9°F | Wt 154.0 lb

## 2015-12-23 DIAGNOSIS — R3915 Urgency of urination: Secondary | ICD-10-CM

## 2015-12-23 DIAGNOSIS — R05 Cough: Secondary | ICD-10-CM

## 2015-12-23 DIAGNOSIS — R059 Cough, unspecified: Secondary | ICD-10-CM

## 2015-12-23 MED ORDER — OXYBUTYNIN CHLORIDE ER 5 MG PO TB24: 5.0000 mg | ORAL_TABLET | Freq: Every day | ORAL | Status: DC

## 2015-12-23 MED ORDER — HYDROCODONE-HOMATROPINE 5-1.5 MG/5ML PO SYRP: 5.0000 mL | ORAL_SOLUTION | Freq: Three times a day (TID) | ORAL | Status: DC | PRN

## 2015-12-23 NOTE — Progress Notes (Signed)
Pre visit review using our clinic review tool, if applicable. No additional management support is needed unless otherwise documented below in the visit note. 

## 2015-12-23 NOTE — Patient Instructions (Signed)

## 2015-12-23 NOTE — Progress Notes (Signed)
Subjective:    Patient ID: [name], female    DOB: 31-May-1951, 65 y.o.   MRN: [id number]  HPI  Pt presents to the clinic today with c/o urinary urgency. She reports this started 1 year ago but has gotten worse over the last 3 months. When she has to go, she has to go right then. She tells me she has to carry a jar with her in the car in case she has no where to stop and use the restroom. She denies urinary frequency, dysuria, bladder pressure. She denies vaginal complaints. She only drinks 1 cup of coffee per day. She does drink water. She has never been told that she has diabetes.  She also reports she was diagnosed with acute bronchitis 1 week ago. She went to UC and was given Cefdiner, Flonase and cough syrup. She has not really noticed that much improvement. She still has 5 days left on her antibiotic. She is still coughing up yellow mucous. She denies fever, chills or body aches. She denies shortness of breath. She has no history of allergies or breathing problems. She has not had sick contacts that she is aware of.    Review of Systems      Past Medical History  Diagnosis Date  . Depression   . Thyroid disease     Current Outpatient Prescriptions  Medication Sig Dispense Refill  . acyclovir (ZOVIRAX) 200 MG capsule Take 1 capsule by mouth  every day as needed 90 capsule 0  . buPROPion (WELLBUTRIN XL) 300 MG 24 hr tablet Take 300 mg by mouth daily.    . cefdinir (OMNICEF) 300 MG capsule     . clonazePAM (KLONOPIN) 0.5 MG tablet Take 0.5 mg by mouth as needed for anxiety.    Marland Kitchen FLUoxetine (PROZAC) 40 MG capsule Take 40 mg by mouth daily.    . fluticasone (FLONASE) 50 MCG/ACT nasal spray     . levothyroxine (SYNTHROID, LEVOTHROID) 88 MCG tablet TAKE 1 TABLET BY MOUTH  DAILY BEFORE BREAKFAST. 90 tablet 0  . PROMETHAZINE VC PLAIN 6.25-5 MG/5ML SYRP     . triamcinolone ointment (KENALOG) 0.5 % Apply 1 application topically 2 (two) times daily. 30 g 0   No current  facility-administered medications for this visit.    Allergies  Allergen Reactions  . Codeine Hives    Family History  Problem Relation Age of Onset  . Cancer Mother     Uterine, Ovarian  . Heart disease Father   . Stroke Father   . Diabetes Father   . Cancer Maternal Grandmother     Social History   Social History  . Marital Status: Married    Spouse Name: N/A  . Number of Children: N/A  . Years of Education: N/A   Occupational History  . Not on file.   Social History Main Topics  . Smoking status: Never Smoker   . Smokeless tobacco: Never Used  . Alcohol Use: 0.0 oz/week    0 Standard drinks or equivalent per week     Comment: 2-3 nights week--1 glass of wine  . Drug Use: No  . Sexual Activity: Yes   Other Topics Concern  . Not on file   Social History Narrative     Constitutional: Denies fever, malaise, fatigue, headache or abrupt weight changes.  HEENT: Denies eye pain, eye redness, ear pain, ringing in the ears, wax buildup, runny nose, nasal congestion, bloody nose, or sore throat. Respiratory: Pt reports cough. Denies difficulty breathing,  shortness of breath, or sputum production.   Cardiovascular: Denies chest pain, chest tightness, palpitations or swelling in the hands or feet.  Gastrointestinal: Denies abdominal pain, bloating, constipation, diarrhea or blood in the stool.  GU: Pt reports urinary urgency. Denies frequency, pain with urination, burning sensation, blood in urine, odor or discharge.  No other specific complaints in a complete review of systems (except as listed in HPI above).  Objective:   Physical Exam  BP 118/78 mmHg  Pulse 74  Temp(Src) 97.9 F (36.6 C) (Oral)  Wt 154 lb (69.854 kg)  SpO2 97% Wt Readings from Last 3 Encounters:  12/23/15 154 lb (69.854 kg)  08/31/15 154 lb (69.854 kg)  08/24/15 153 lb (69.4 kg)    General: Appears their stated age, well developed, well nourished in NAD. HEENT: Head: normal shape and  size, no sinus tenderness noted; Eyes: sclera white, no icterus, conjunctiva pink; Ears: Tm's gray and intact, normal light reflex; Nose: mucosa pink and moist, septum midline; Throat/Mouth: Teeth present, mucosa pink and moist, + PND, no exudate, lesions or ulcerations noted.  Neck:  No adenopathy noted.  Cardiovascular: Normal rate and rhythm. S1,S2 noted.  No murmur, rubs or gallops noted.  Pulmonary/Chest: Normal effort and positive vesicular breath sounds. No respiratory distress. No wheezes, rales or ronchi noted.  Abdomen: Soft and nontender. Normal bowel sounds. No CVA tenderness noted.  Neurological: Alert and oriented.   BMET No results found for: NA, K, CL, CO2, GLUCOSE, BUN, CREATININE, CALCIUM, GFRNONAA, GFRAA  Lipid Panel  No results found for: CHOL, TRIG, HDL, CHOLHDL, VLDL, LDLCALC  CBC    Component Value Date/Time   WBC 10.3 04/29/2010 0749   RBC 4.20 04/29/2010 0749   HGB 14.0 04/29/2010 0749   HCT 40.2 04/29/2010 0749   PLT 129* 04/29/2010 0749   MCV 95.7 04/29/2010 0749   MCHC 34.9 04/29/2010 0749   RDW 13.3 04/29/2010 0749    Hgb A1C No results found for: HGBA1C       Assessment & Plan:   Urinary urgency:  Urinalysis normal ? OAB She is not interested in urology referral at this time eRx for Ditropan 5 mg XL  Update me in 4 weeks with how you are doing  Post infectious cough:  Exam normal Continue Cefdinir, Flonase RX for Hycodan cough syrup Return precautions given  RTC as needed or if symptoms persist or worsen

## 2015-12-28 LAB — POC URINALSYSI DIPSTICK (AUTOMATED)
Bilirubin, UA: NEGATIVE
Blood, UA: NEGATIVE
Glucose, UA: NEGATIVE
Ketones, UA: NEGATIVE
Leukocytes, UA: NEGATIVE
NITRITE UA: NEGATIVE
PROTEIN UA: NEGATIVE
Spec Grav, UA: 1.025
UROBILINOGEN UA: NEGATIVE
pH, UA: 6

## 2015-12-28 NOTE — Addendum Note (Signed)
Addended by: Lurlean Nanny on: 12/28/2015 09:23 AM   Modules accepted: Orders

## 2015-12-31 ENCOUNTER — Ambulatory Visit (INDEPENDENT_AMBULATORY_CARE_PROVIDER_SITE_OTHER): Payer: Managed Care, Other (non HMO) | Admitting: Internal Medicine

## 2015-12-31 ENCOUNTER — Encounter: Payer: Self-pay | Admitting: Internal Medicine

## 2015-12-31 VITALS — BP 116/72 | HR 83 | Temp 98.2°F | Wt 151.0 lb

## 2015-12-31 DIAGNOSIS — H9201 Otalgia, right ear: Secondary | ICD-10-CM | POA: Diagnosis not present

## 2015-12-31 DIAGNOSIS — J029 Acute pharyngitis, unspecified: Secondary | ICD-10-CM | POA: Diagnosis not present

## 2015-12-31 NOTE — Patient Instructions (Signed)
Earache An earache, also called otalgia, can be caused by many things. Pain from an earache can be sharp, dull, or burning. The pain may be temporary or constant. Earaches can be caused by problems with the ear, such as infection in either the middle ear or the ear canal, injury, impacted ear wax, middle ear pressure, or a foreign body in the ear. Ear pain can also result from problems in other areas. This is called referred pain. For example, pain can come from a sore throat, a tooth infection, or problems with the jaw or the joint between the jaw and the skull (temporomandibular joint, or TMJ). The cause of an earache is not always easy to identify. Watchful waiting may be appropriate for some earaches until a clear cause of the pain can be found. HOME CARE INSTRUCTIONS Watch your condition for any changes. The following actions may help to lessen any discomfort that you are feeling:  Take medicines only as directed by your health care provider. This includes ear drops.  Apply ice to your outer ear to help reduce pain.  Put ice in a plastic bag.  Place a towel between your skin and the bag.  Leave the ice on for 20 minutes, 2-3 times per day.  Do not put anything in your ear other than medicine that is prescribed by your health care provider.  Try resting in an upright position instead of lying down. This may help to reduce pressure in the middle ear and relieve pain.  Chew gum if it helps to relieve your ear pain.  Control any allergies that you have.  Keep all follow-up visits as directed by your health care provider. This is important. SEEK MEDICAL CARE IF:  Your pain does not improve within 2 days.  You have a fever.  You have new or worsening symptoms. SEEK IMMEDIATE MEDICAL CARE IF:  You have a severe headache.  You have a stiff neck.  You have difficulty swallowing.  You have redness or swelling behind your ear.  You have drainage from your ear.  You have hearing  loss.  You feel dizzy.   This information is not intended to replace advice given to you by your health care provider. Make sure you discuss any questions you have with your health care provider.   Document Released: 07/14/2004 Document Revised: 12/18/2014 Document Reviewed: 06/28/2014 Elsevier Interactive Patient Education 2016 [employer]  

## 2015-12-31 NOTE — Progress Notes (Signed)
Pre visit review using our clinic review tool, if applicable. No additional management support is needed unless otherwise documented below in the visit note. 

## 2015-12-31 NOTE — Progress Notes (Signed)
Subjective:    Patient ID: [name], female    DOB: 1951-07-16, 65 y.o.   MRN: [id number]  HPI  Pt presents to the clinic today with c/o right ear pain, sore throat and cough. This started 3-4 days ago. She describes the pain as dull. She denies decreased hearing or drainage from the ear. She has had some difficulty swallowing on the right side. Her symptoms seem worse at night and in the morning.  She denies fever, chills or body aches. She has tried salt water gargles and cough medicine with minimal relief. She has a history of seasonal allergies. She has had sick contacts.  Review of Systems  Past Medical History  Diagnosis Date  . Depression   . Thyroid disease     Current Outpatient Prescriptions  Medication Sig Dispense Refill  . acyclovir (ZOVIRAX) 200 MG capsule Take 1 capsule by mouth  every day as needed 90 capsule 0  . buPROPion (WELLBUTRIN XL) 300 MG 24 hr tablet Take 300 mg by mouth daily.    . cefdinir (OMNICEF) 300 MG capsule     . clonazePAM (KLONOPIN) 0.5 MG tablet Take 0.5 mg by mouth as needed for anxiety.    Marland Kitchen FLUoxetine (PROZAC) 40 MG capsule Take 40 mg by mouth daily.    . fluticasone (FLONASE) 50 MCG/ACT nasal spray     . HYDROcodone-homatropine (HYCODAN) 5-1.5 MG/5ML syrup Take 5 mLs by mouth every 8 (eight) hours as needed for cough. 120 mL 0  . levothyroxine (SYNTHROID, LEVOTHROID) 88 MCG tablet TAKE 1 TABLET BY MOUTH  DAILY BEFORE BREAKFAST. 90 tablet 0  . oxybutynin (DITROPAN-XL) 5 MG 24 hr tablet Take 1 tablet (5 mg total) by mouth at bedtime. 30 tablet 2  . PROMETHAZINE VC PLAIN 6.25-5 MG/5ML SYRP     . triamcinolone ointment (KENALOG) 0.5 % Apply 1 application topically 2 (two) times daily. 30 g 0   No current facility-administered medications for this visit.    Allergies  Allergen Reactions  . Codeine Hives    Family History  Problem Relation Age of Onset  . Cancer Mother     Uterine, Ovarian  . Heart disease Father   . Stroke Father     . Diabetes Father   . Cancer Maternal Grandmother     Social History   Social History  . Marital Status: Married    Spouse Name: N/A  . Number of Children: N/A  . Years of Education: N/A   Occupational History  . Not on file.   Social History Main Topics  . Smoking status: Never Smoker   . Smokeless tobacco: Never Used  . Alcohol Use: 0.0 oz/week    0 Standard drinks or equivalent per week     Comment: 2-3 nights week--1 glass of wine  . Drug Use: No  . Sexual Activity: Yes   Other Topics Concern  . Not on file   Social History Narrative     Constitutional: Denies fever, malaise, fatigue, headache or abrupt weight changes.  HEENT: Pt reports right ear pain, sore throat. Denies eye pain, eye redness, ringing in the ears, wax buildup, runny nose, nasal congestion, bloody nose. Respiratory: Pt reports cough. Denies difficulty breathing, shortness of breath, or sputum production.   Cardiovascular: Denies chest pain, chest tightness, palpitations or swelling in the hands or feet.   No other specific complaints in a complete review of systems (except as listed in HPI above).     Objective:   Physical  Exam   BP 116/72 mmHg  Pulse 83  Temp(Src) 98.2 F (36.8 C) (Oral)  Wt 151 lb (68.493 kg)  SpO2 97% Wt Readings from Last 3 Encounters:  12/31/15 151 lb (68.493 kg)  12/23/15 154 lb (69.854 kg)  08/31/15 154 lb (69.854 kg)    General: Appears her stated age, in NAD. HEENT: Head: normal shape and size, mild maxillary sinus tenderness noted on the right; Eyes: sclera white, no icterus, conjunctiva pink; Left Ears: Tm's gray and intact, normal light reflex; Nose: mucosa pink and moist, septum midline; Throat/Mouth: Teeth present, mucosa pink, and moist, no exudate, lesions or ulcerations noted.  Neck:  No adenopathy noted.  Cardiovascular: Normal rate and rhythm. S1,S2 noted.  No murmur, rubs or gallops noted.  Pulmonary/Chest: Normal effort and positive vesicular  breath sounds. No respiratory distress. No wheezes, rales or ronchi noted.        Assessment & Plan:   Otalgia, right ear and sore throat:  Exam normal Try Ibuprofen and Flonase OTC Return precautions given  RTC as needed or if symptoms persist or worsen

## 2016-01-07 ENCOUNTER — Ambulatory Visit (INDEPENDENT_AMBULATORY_CARE_PROVIDER_SITE_OTHER): Payer: Managed Care, Other (non HMO) | Admitting: Family Medicine

## 2016-01-07 VITALS — BP 128/74 | HR 82 | Temp 97.9°F | Wt 152.0 lb

## 2016-01-07 DIAGNOSIS — H109 Unspecified conjunctivitis: Secondary | ICD-10-CM | POA: Diagnosis not present

## 2016-01-07 MED ORDER — ERYTHROMYCIN 5 MG/GM OP OINT: 1.0000 "application " | TOPICAL_OINTMENT | Freq: Three times a day (TID) | OPHTHALMIC | Status: DC

## 2016-01-07 NOTE — Progress Notes (Signed)
Pre visit review using our clinic review tool, if applicable. No additional management support is needed unless otherwise documented below in the visit note. 

## 2016-01-07 NOTE — Patient Instructions (Signed)
Nice to meet you. Your symptoms are likely related to either a viral illness or allergic rhinitis, you could try nasal Flonase to help with her symptoms. I have provided you with the prescription for an antibiotic ointment for your advised only be filled if you develop worsening discharge from her eyes. If you develop pain in your eyes, redness, vision changes, fevers, formed body sensation in your eyes, sensitivity to light, or any new or changing symptoms she needs to be evaluated and you should not fill the prescription.

## 2016-01-08 ENCOUNTER — Encounter: Payer: Self-pay | Admitting: Family Medicine

## 2016-01-08 DIAGNOSIS — H109 Unspecified conjunctivitis: Secondary | ICD-10-CM | POA: Insufficient documentation

## 2016-01-08 NOTE — Assessment & Plan Note (Signed)
Patient's history is most consistent with viral conjunctivitis though could also be allergic conjunctivitis. doubt bacterial conjunctivitis given lack of copious amounts of purulent discharge and relatively normal appearing conjunctiva. This is not consistent with keratitis. Vision is intact. Discussed likely viral nature. Advised on using Flonase in case there is an allergic component. Did provide a prescription for erythromycin eye ointment to be used if she progresses to have increased discharge. She was advised to only fill this if the discharge increased. If she develops any new symptoms she was advised to seek medical attention. Given return precautions.

## 2016-01-08 NOTE — Progress Notes (Signed)
Patient ID: [name], female   DOB: [birth year], 65 y.o.   MRN: [id number]  Tommi Rumps, MD Phone: [phone]  [name] is a 65 y.o. female who presents today for same-day visit.  Patient comes in complaining of conjunctivitis. She notes for the last 3 weeks she has had an upper respiratory infection that is improving at this time. She had rhinorrhea and cough and postnasal drip. She's been treated for this multiple times and it is started to improve. Over the last several days she has started to develop some "gunk" in her left eye. Notes the eye itches. There is no sensation of foreign body. There is no eye pain. There is no photophobia. She notes the discharge is yellowish. She noted she woke up this morning with "gunk" in both eyes. She does not have purulent material or mucus coming out of her eyes during the day. It is only there when she wakes up in the morning. Notes there were matted shut. She does note history of keratitis in the past. She notes no vision changes.  PMH: nonsmoker.   ROS see history of present illness  Objective  Physical Exam Filed Vitals:   01/07/16 1617  BP: 128/74  Pulse: 82  Temp: 97.9 F (36.6 C)    BP Readings from Last 3 Encounters:  01/07/16 128/74  12/31/15 116/72  12/23/15 118/78   Wt Readings from Last 3 Encounters:  01/07/16 152 lb (68.947 kg)  12/31/15 151 lb (68.493 kg)  12/23/15 154 lb (69.854 kg)    Physical Exam  Constitutional: She is well-developed, well-nourished, and in no distress.  HENT:  Head: Normocephalic and atraumatic.  Right Ear: External ear normal.  Left Ear: External ear normal.  Mouth/Throat: Oropharynx is clear and moist. No oropharyngeal exudate.  Normal TMs bilaterally  Eyes: Pupils are equal, round, and reactive to light.  Minimal if any conjunctival erythema, no discharge from the eye, cornea appears intact on inspection with light, no apparent blood or white blood cells noted inside the cornea    Neck: Neck supple.  Cardiovascular: Normal rate and normal heart sounds.   Pulmonary/Chest: Effort normal and breath sounds normal.  Lymphadenopathy:    She has no cervical adenopathy.  Skin: Skin is warm and dry. She is not diaphoretic.     Assessment/Plan: Please see individual problem list.  Conjunctivitis Patient's history is most consistent with viral conjunctivitis though could also be allergic conjunctivitis. doubt bacterial conjunctivitis given lack of copious amounts of purulent discharge and relatively normal appearing conjunctiva. This is not consistent with keratitis. Vision is intact. Discussed likely viral nature. Advised on using Flonase in case there is an allergic component. Did provide a prescription for erythromycin eye ointment to be used if she progresses to have increased discharge. She was advised to only fill this if the discharge increased. If she develops any new symptoms she was advised to seek medical attention. Given return precautions.    Meds ordered this encounter  Medications  . erythromycin ophthalmic ointment    Sig: Place 1 application into both eyes 3 (three) times daily. Do not fill before 01/08/16    Dispense:  3.5 g    Refill:  0     Tommi Rumps

## 2016-01-30 ENCOUNTER — Other Ambulatory Visit: Payer: Self-pay | Admitting: Internal Medicine

## 2016-02-08 ENCOUNTER — Other Ambulatory Visit: Payer: Self-pay | Admitting: Internal Medicine

## 2016-02-08 MED ORDER — LEVOTHYROXINE SODIUM 88 MCG PO TABS: 88.0000 ug | ORAL_TABLET | Freq: Every day | ORAL | Status: DC

## 2016-02-08 MED ORDER — OXYBUTYNIN CHLORIDE ER 10 MG PO TB24: 10.0000 mg | ORAL_TABLET | Freq: Every day | ORAL | Status: DC

## 2016-02-08 NOTE — Addendum Note (Signed)
Addended by: Jearld Fenton on: 02/08/2016 01:34 PM   Modules accepted: Orders

## 2016-02-09 ENCOUNTER — Other Ambulatory Visit: Payer: Self-pay | Admitting: Internal Medicine

## 2016-02-09 MED ORDER — OXYBUTYNIN CHLORIDE ER 10 MG PO TB24: 10.0000 mg | ORAL_TABLET | Freq: Every day | ORAL | Status: DC

## 2016-02-09 NOTE — Addendum Note (Signed)
Addended by: Lurlean Nanny on: 02/09/2016 04:58 PM   Modules accepted: Orders

## 2016-03-19 ENCOUNTER — Encounter: Payer: Self-pay | Admitting: Internal Medicine

## 2016-03-19 ENCOUNTER — Other Ambulatory Visit: Payer: Self-pay | Admitting: Internal Medicine

## 2016-03-20 ENCOUNTER — Ambulatory Visit (INDEPENDENT_AMBULATORY_CARE_PROVIDER_SITE_OTHER): Payer: Managed Care, Other (non HMO) | Admitting: Internal Medicine

## 2016-03-20 ENCOUNTER — Encounter: Payer: Self-pay | Admitting: Internal Medicine

## 2016-03-20 VITALS — BP 124/72 | HR 62 | Temp 98.1°F | Wt 147.5 lb

## 2016-03-20 DIAGNOSIS — R8290 Unspecified abnormal findings in urine: Secondary | ICD-10-CM

## 2016-03-20 DIAGNOSIS — N390 Urinary tract infection, site not specified: Secondary | ICD-10-CM | POA: Diagnosis not present

## 2016-03-20 LAB — POC URINALSYSI DIPSTICK (AUTOMATED)
BILIRUBIN UA: NEGATIVE
Blood, UA: 10
Glucose, UA: NEGATIVE
Ketones, UA: NEGATIVE
Spec Grav, UA: >=1.03
pH, UA: 6

## 2016-03-20 MED ORDER — NITROFURANTOIN MONOHYD MACRO 100 MG PO CAPS: 100.0000 mg | ORAL_CAPSULE | Freq: Two times a day (BID) | ORAL | Status: DC

## 2016-03-20 NOTE — Progress Notes (Signed)
Pre visit review using our clinic review tool, if applicable. No additional management support is needed unless otherwise documented below in the visit note. 

## 2016-03-20 NOTE — Telephone Encounter (Signed)
Pt was seen today for an acute appt but no recent f/u or CPE and no recent TSH labs, please advise

## 2016-03-20 NOTE — Telephone Encounter (Signed)
90 day supply sent electronically. Must make OV for further refills.

## 2016-03-20 NOTE — Telephone Encounter (Signed)
Called pt and scheduled her an appt today for UTI

## 2016-03-20 NOTE — Progress Notes (Signed)
HPI  Pt presents to the clinic today with c/o urinary urgency, increased frequency, dysuria, hematuria and left side low back pain. This started 3 days ago. she denies fever, chills or nausea. Shet has been taking AZO since Saturday without any relief. She denies vaginal complaints.   Review of Systems  Past Medical History  Diagnosis Date  . Depression   . Thyroid disease     Family History  Problem Relation Age of Onset  . Cancer Mother     Uterine, Ovarian  . Heart disease Father   . Stroke Father   . Diabetes Father   . Cancer Maternal Grandmother     Social History   Social History  . Marital Status: Married    Spouse Name: N/A  . Number of Children: N/A  . Years of Education: N/A   Occupational History  . Not on file.   Social History Main Topics  . Smoking status: Never Smoker   . Smokeless tobacco: Never Used  . Alcohol Use: 0.0 oz/week    0 Standard drinks or equivalent per week     Comment: 2-3 nights week--1 glass of wine  . Drug Use: No  . Sexual Activity: Yes   Other Topics Concern  . Not on file   Social History Narrative    Allergies  Allergen Reactions  . Codeine Hives    Constitutional: Denies fever, malaise, fatigue, headache or abrupt weight changes.   GU: Pt reports urgency, frequency, pain with urination, burning sensation, and blood in urine. Pt denies itching or odor. Skin: Denies redness, rashes, lesions or ulcercations.   No other specific complaints in a complete review of systems (except as listed in HPI above).    Objective:   Physical Exam  BP 124/72 mmHg  Pulse 62  Temp(Src) 98.1 F (36.7 C) (Oral)  Wt 147 lb 8 oz (66.906 kg)  SpO2 95% Wt Readings from Last 3 Encounters:  03/20/16 147 lb 8 oz (66.906 kg)  01/07/16 152 lb (68.947 kg)  12/31/15 151 lb (68.493 kg)    General: Appears her stated age, well developed, well nourished in NAD. Cardiovascular: Normal rate and rhythm. S1,S2 noted.   Pulmonary/Chest:  Normal effort and positive vesicular breath sounds. No respiratory distress. No wheezes, rales or ronchi noted.  Abdomen: Soft. Normal bowel sounds. No distention or masses noted.  Tender to palpation over the bladder area. Left sided CVA tenderness  noted.      Assessment & Plan:   Urgency, Frequency, Dysuria secondary to UTI  Urinalysis: +leukocytes and blood Will send urine culture eRx sent if for Macrobid 100 mg BID x 5 days OK to take AZO OTC  Drink plenty of fluids  RTC as needed or if symptoms persist.

## 2016-03-20 NOTE — Patient Instructions (Signed)

## 2016-03-21 NOTE — Telephone Encounter (Signed)
Pt was driving so she will go online and schedule a f/u on Mychart when she gets home

## 2016-03-23 LAB — URINE CULTURE: Colony Count: 70000

## 2016-04-18 ENCOUNTER — Ambulatory Visit (INDEPENDENT_AMBULATORY_CARE_PROVIDER_SITE_OTHER): Payer: Managed Care, Other (non HMO) | Admitting: Internal Medicine

## 2016-04-18 ENCOUNTER — Encounter: Payer: Self-pay | Admitting: Internal Medicine

## 2016-04-18 VITALS — BP 122/74 | HR 67 | Temp 98.7°F | Wt 150.8 lb

## 2016-04-18 DIAGNOSIS — F419 Anxiety disorder, unspecified: Principal | ICD-10-CM

## 2016-04-18 DIAGNOSIS — F418 Other specified anxiety disorders: Secondary | ICD-10-CM | POA: Diagnosis not present

## 2016-04-18 DIAGNOSIS — E039 Hypothyroidism, unspecified: Secondary | ICD-10-CM

## 2016-04-18 DIAGNOSIS — F329 Major depressive disorder, single episode, unspecified: Secondary | ICD-10-CM

## 2016-04-18 DIAGNOSIS — N3281 Overactive bladder: Secondary | ICD-10-CM

## 2016-04-18 DIAGNOSIS — F32A Depression, unspecified: Secondary | ICD-10-CM

## 2016-04-18 LAB — COMPREHENSIVE METABOLIC PANEL
ALT: 15 U/L (ref 0–35)
AST: 20 U/L (ref 0–37)
Albumin: 4.2 g/dL (ref 3.5–5.2)
Alkaline Phosphatase: 71 U/L (ref 39–117)
BUN: 15 mg/dL (ref 6–23)
CALCIUM: 9.3 mg/dL (ref 8.4–10.5)
CHLORIDE: 104 meq/L (ref 96–112)
CO2: 29 meq/L (ref 19–32)
Creatinine, Ser: 0.97 mg/dL (ref 0.40–1.20)
GFR: 61.71 mL/min (ref >60.00)
Glucose, Bld: 96 mg/dL (ref 70–99)
Potassium: 3.8 mEq/L (ref 3.5–5.1)
Sodium: 141 mEq/L (ref 135–145)
Total Bilirubin: 0.4 mg/dL (ref 0.2–1.2)
Total Protein: 6.5 g/dL (ref 6.0–8.3)

## 2016-04-18 LAB — TSH: TSH: 1.84 u[IU]/mL (ref 0.35–4.50)

## 2016-04-18 LAB — T4, FREE: FREE T4: 0.87 ng/dL (ref 0.60–1.60)

## 2016-04-18 MED ORDER — FLUOXETINE HCL 40 MG PO CAPS: 40.0000 mg | ORAL_CAPSULE | Freq: Every day | ORAL | Status: DC

## 2016-04-18 MED ORDER — CLONAZEPAM 0.5 MG PO TABS: 0.5000 mg | ORAL_TABLET | ORAL | Status: DC | PRN

## 2016-04-18 MED ORDER — BUPROPION HCL ER (XL) 300 MG PO TB24: 300.0000 mg | ORAL_TABLET | Freq: Every day | ORAL | Status: DC

## 2016-04-18 MED ORDER — OXYBUTYNIN CHLORIDE ER 10 MG PO TB24: 10.0000 mg | ORAL_TABLET | Freq: Every day | ORAL | Status: DC

## 2016-04-18 NOTE — Progress Notes (Signed)
Pre visit review using our clinic review tool, if applicable. No additional management support is needed unless otherwise documented below in the visit note. 

## 2016-04-18 NOTE — Assessment & Plan Note (Addendum)
Wants referral to Urogynecology to discuss bladder tacking Continue Oxybutynin

## 2016-04-18 NOTE — Patient Instructions (Signed)

## 2016-04-18 NOTE — Assessment & Plan Note (Signed)
Controlled on Synthroid Check TSH and T4 today

## 2016-04-18 NOTE — Assessment & Plan Note (Addendum)
Controlled Controlled on Wellbutrin and Prozac daily Klonopin PRN

## 2016-04-18 NOTE — Progress Notes (Signed)
Subjective:    Patient ID: [name], female    DOB: 02-15-1951, 65 y.o.   MRN: [id number]  HPI  Pt presents to the clinic today for a follow up of chronic conditions.  Hypothyroidism: Last TSH 01/2015 reviewed. Denies weight gain, constipation, hair/skin thinning, or cold intolerance.    Anxiety and depression: Controlled on Bupropion, and Prozac daily. Klonopin only as needed. She denies increase in anxiety and depression. Denies SI/HI.  OAB: On Oxybutynin. Occasional issues with increased urgency. She is interested in exploring surgical evaluation.    Review of Systems  Past Medical History  Diagnosis Date  . Depression   . Thyroid disease     Current Outpatient Prescriptions  Medication Sig Dispense Refill  . acyclovir (ZOVIRAX) 200 MG capsule Take 1 capsule by mouth  every day as needed 90 capsule 0  . buPROPion (WELLBUTRIN XL) 300 MG 24 hr tablet Take 300 mg by mouth daily.    . clonazePAM (KLONOPIN) 0.5 MG tablet Take 0.5 mg by mouth as needed for anxiety.    Marland Kitchen FLUoxetine (PROZAC) 40 MG capsule Take 40 mg by mouth daily.    . fluticasone (FLONASE) 50 MCG/ACT nasal spray     . levothyroxine (SYNTHROID, LEVOTHROID) 88 MCG tablet Take 1 tablet by mouth  daily before breakfast 90 tablet 0  . oxybutynin (DITROPAN-XL) 10 MG 24 hr tablet Take 1 tablet (10 mg total) by mouth at bedtime. 90 tablet 0   No current facility-administered medications for this visit.    Allergies  Allergen Reactions  . Codeine Hives    Family History  Problem Relation Age of Onset  . Cancer Mother     Uterine, Ovarian  . Heart disease Father   . Stroke Father   . Diabetes Father   . Cancer Maternal Grandmother     Social History   Social History  . Marital Status: Married    Spouse Name: N/A  . Number of Children: N/A  . Years of Education: N/A   Occupational History  . Not on file.   Social History Main Topics  . Smoking status: Never Smoker   . Smokeless tobacco: Never  Used  . Alcohol Use: 0.0 oz/week    0 Standard drinks or equivalent per week     Comment: 2-3 nights week--1 glass of wine  . Drug Use: No  . Sexual Activity: Yes   Other Topics Concern  . Not on file   Social History Narrative     Constitutional: Denies malaise, fatigue, headache or abrupt weight changes.  Respiratory: Denies difficulty breathing, shortness of breath, cough or sputum production.   Cardiovascular: Denies chest pain, chest tightness, palpitations or swelling in the hands or feet.  Gastrointestinal: Denies abdominal pain or constipation GU: Increased urgency. Denies frequency, pain with urination, burning sensation, blood in urine, odor or discharge. Skin: Denies thinning of skin or hair.  Psych: Denies anxiety, depression, SI/HI.  No other specific complaints in a complete review of systems (except as listed in HPI above).     Objective:   Physical Exam  BP 122/74 mmHg  Pulse 67  Temp(Src) 98.7 F (37.1 C) (Oral)  Wt 150 lb 12 oz (68.38 kg)  SpO2 99%  Wt Readings from Last 3 Encounters:  04/18/16 150 lb 12 oz (68.38 kg)  03/20/16 147 lb 8 oz (66.906 kg)  01/07/16 152 lb (68.947 kg)    General: Appears her stated age, well developed, well nourished in NAD. Skin: Warm, dry  and intact. No rashes, lesions or ulcerations noted. Neck:  Neck supple, trachea midline. No masses, lumps or thyromegaly present.  Cardiovascular: Normal rate and rhythm. S1,S2 noted.  No murmur, rubs or gallops noted. Pulmonary/Chest: Normal effort and positive vesicular breath sounds. No respiratory distress. No wheezes, rales or ronchi noted.  Psychiatric: Mood and affect normal. Behavior is normal. Judgment and thought content normal.         Assessment & Plan:

## 2016-04-19 MED ORDER — LEVOTHYROXINE SODIUM 88 MCG PO TABS: ORAL_TABLET | ORAL | Status: DC

## 2016-04-19 NOTE — Addendum Note (Signed)
Addended by: Jearld Fenton on: 04/19/2016 12:39 PM   Modules accepted: Orders

## 2016-05-08 IMAGING — MG MM DIGITAL SCREENING BILAT W/ CAD
4 series · 4 of 4 positions shown · non-contrast
Comparison: Previous exam(s).

CLINICAL DATA: Screening.

EXAM:
DIGITAL SCREENING BILATERAL MAMMOGRAM WITH CAD

[L CC]
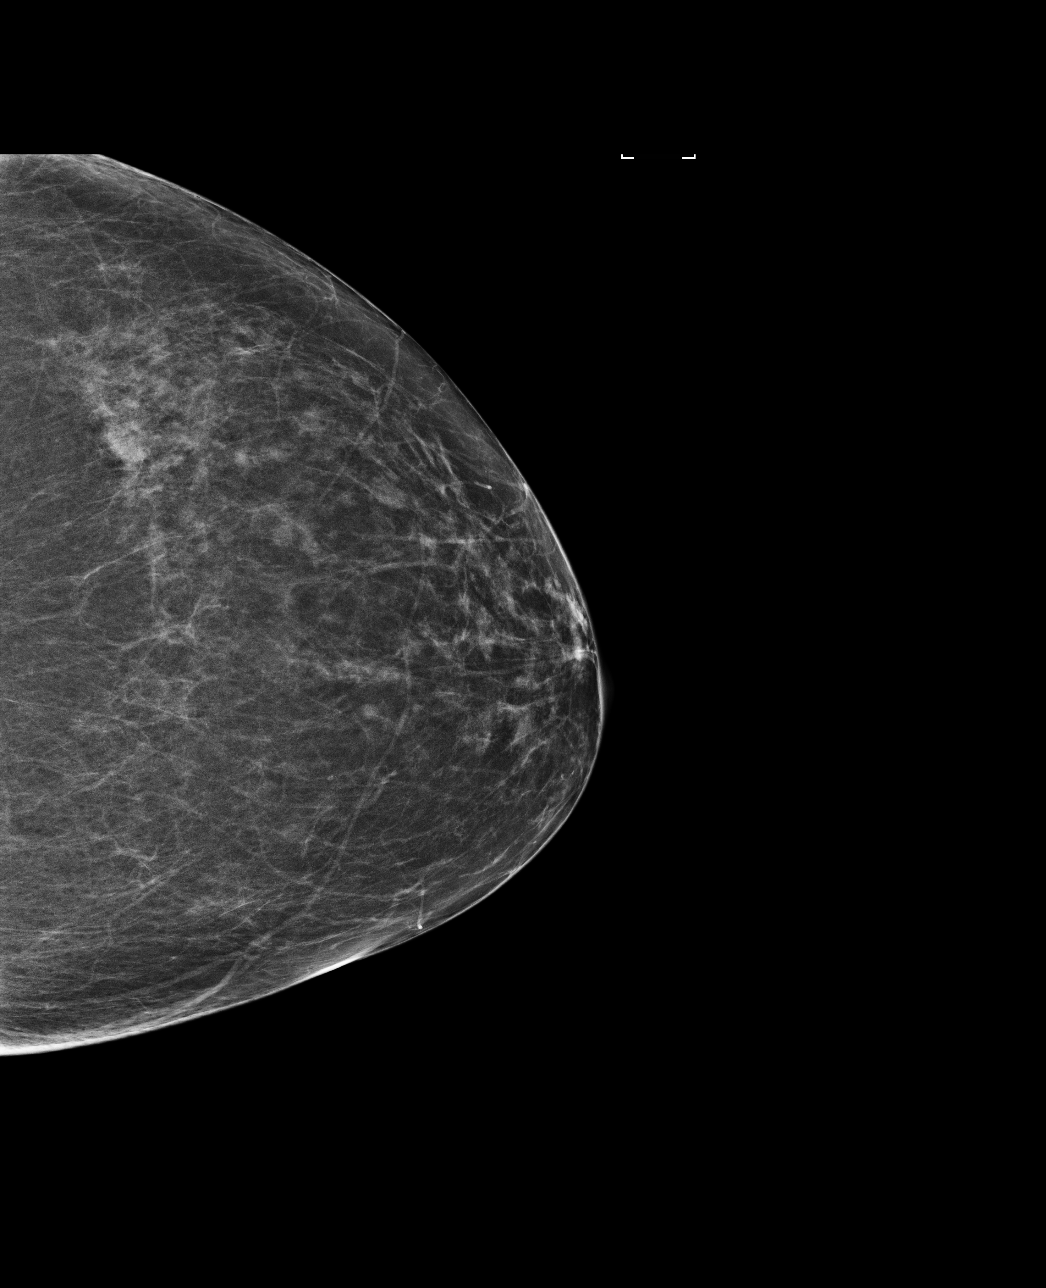

[R MLO]
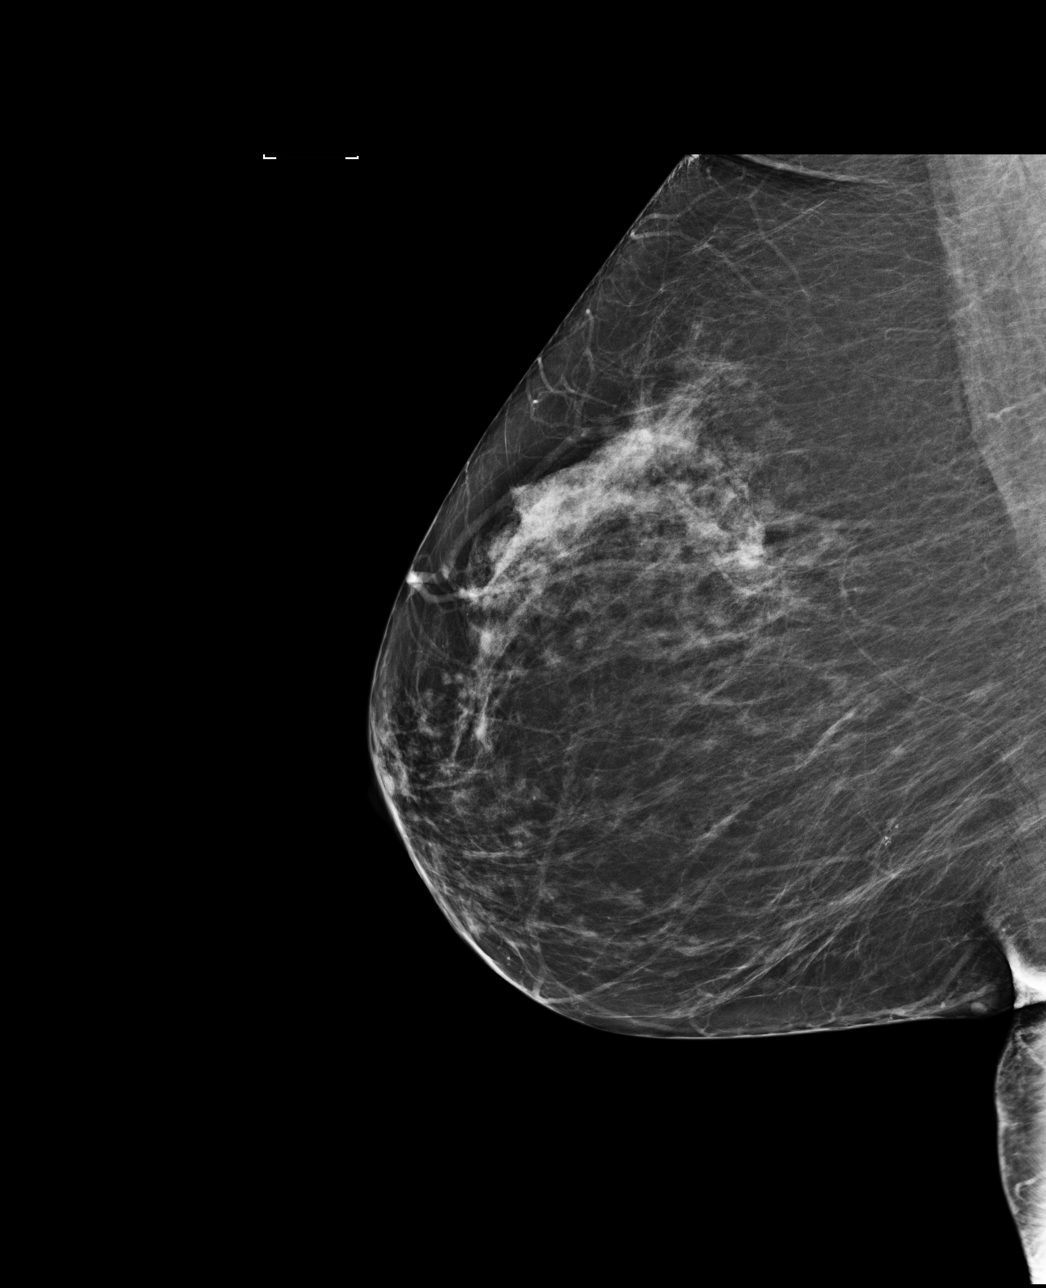

[R CC]
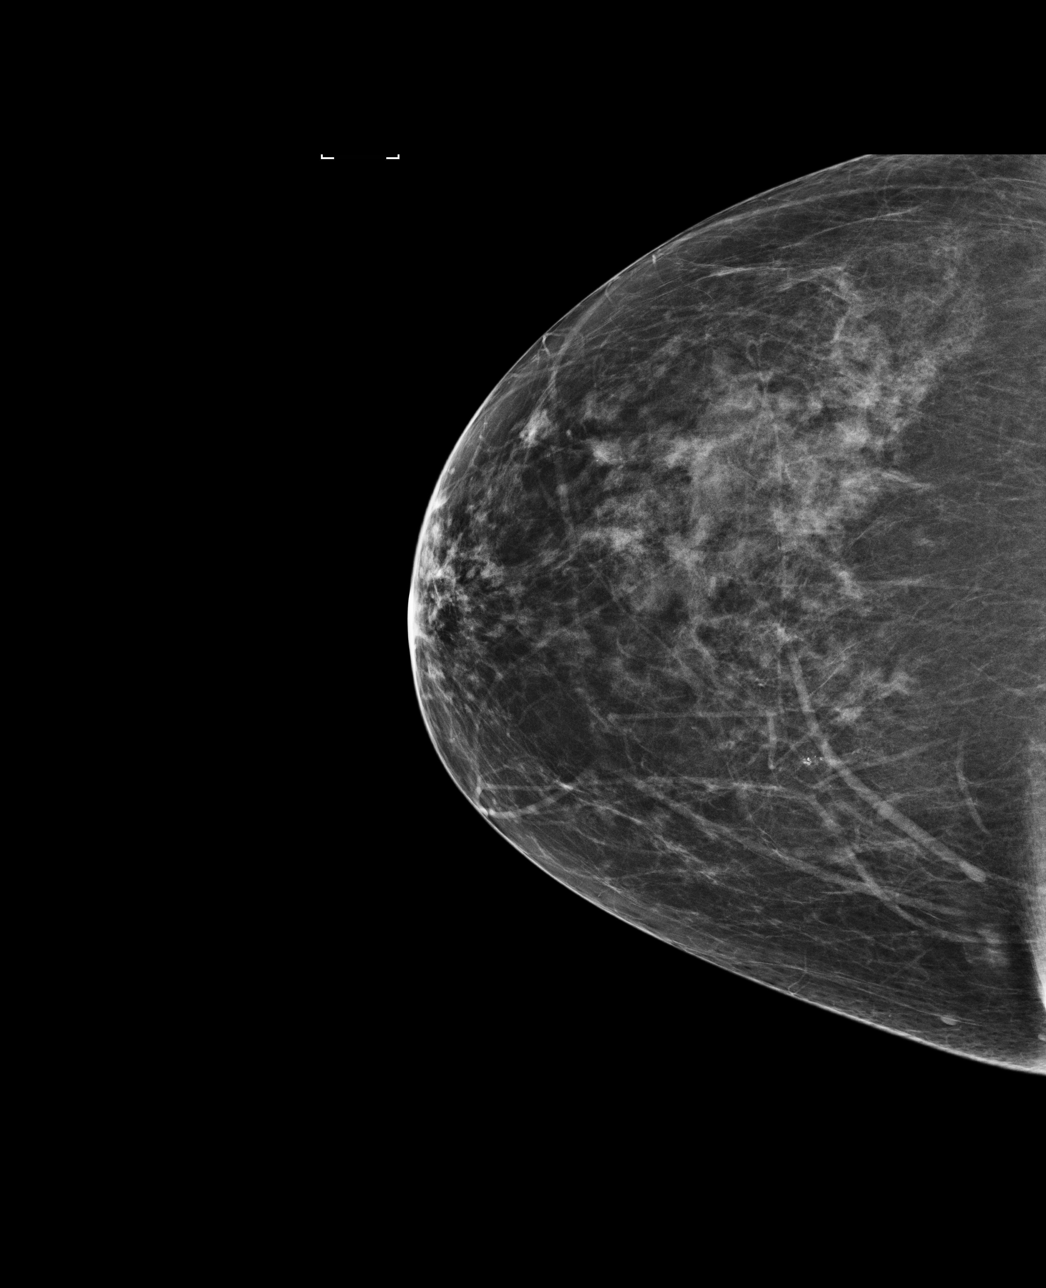

[L MLO]
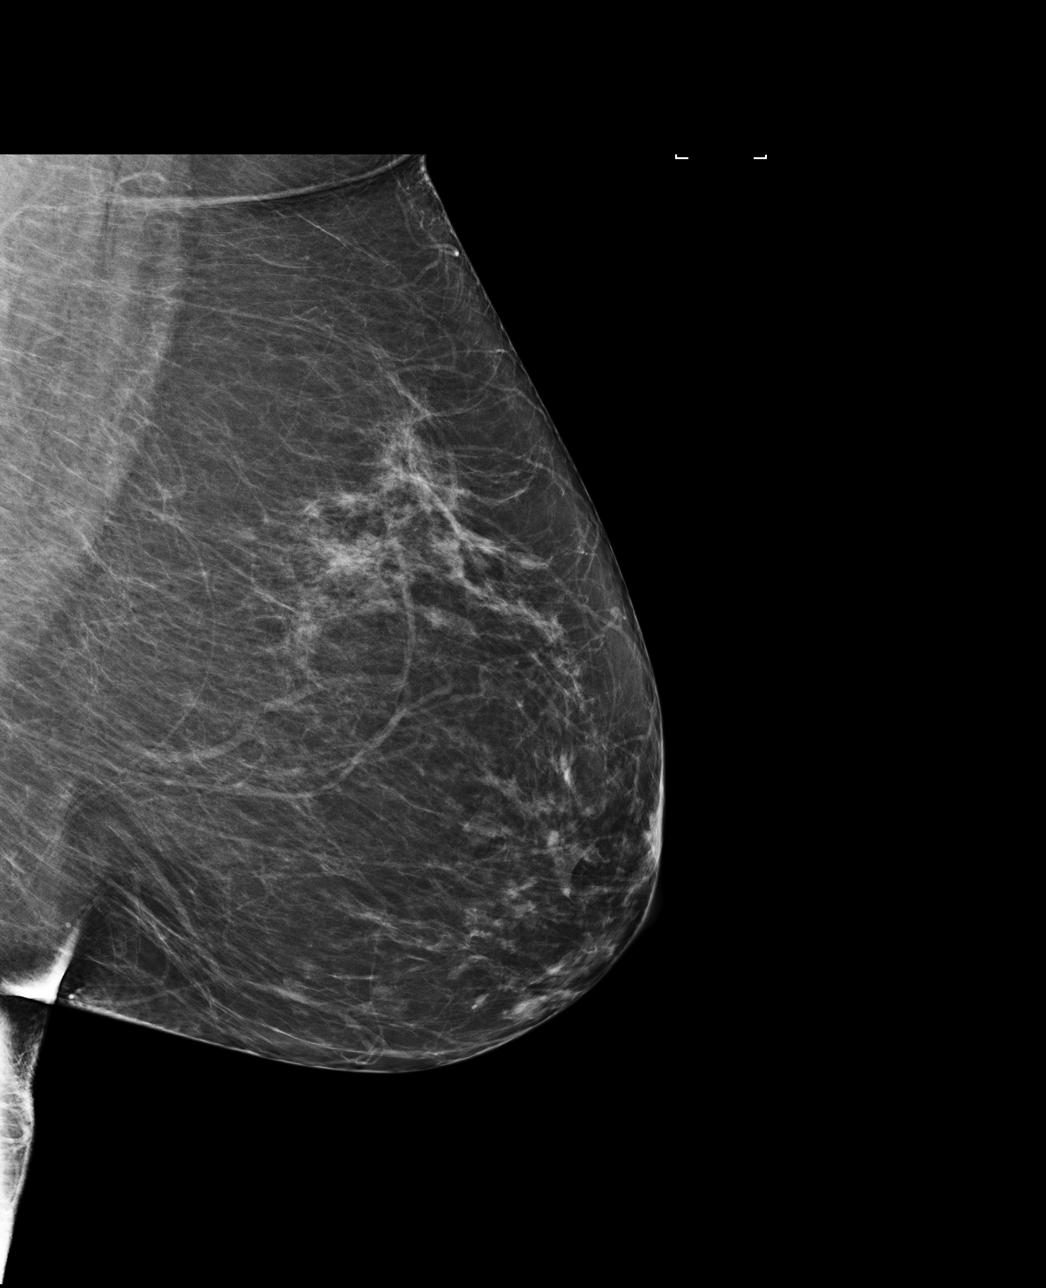

[4 of 4 positions shown; findings below may reference images not displayed]

ACR Breast Density Category b: There are scattered areas of
fibroglandular density.
FINDINGS: In the right breast, calcifications warrant further evaluation with
magnified views. In the left breast, no findings suspicious for
malignancy. Images were processed with CAD.
IMPRESSION: Further evaluation is suggested for calcifications in the right
breast.

RECOMMENDATION:
Diagnostic mammogram of the right breast. (Code:MF-[id number])

The patient will be contacted regarding the findings, and additional
imaging will be scheduled.

BI-RADS CATEGORY  0: Incomplete. Need additional imaging evaluation
and/or prior mammograms for comparison.

## 2016-05-17 ENCOUNTER — Encounter: Payer: Self-pay | Admitting: Urology

## 2016-05-17 ENCOUNTER — Ambulatory Visit (INDEPENDENT_AMBULATORY_CARE_PROVIDER_SITE_OTHER): Payer: Managed Care, Other (non HMO) | Admitting: Urology

## 2016-05-17 VITALS — BP 118/78 | HR 66 | Ht 66.0 in | Wt 147.0 lb

## 2016-05-17 DIAGNOSIS — N3281 Overactive bladder: Secondary | ICD-10-CM

## 2016-05-17 LAB — URINALYSIS, COMPLETE
BILIRUBIN UA: NEGATIVE
Glucose, UA: NEGATIVE
KETONES UA: NEGATIVE
Nitrite, UA: NEGATIVE
PROTEIN UA: NEGATIVE
SPEC GRAV UA: 1.025 (ref 1.005–1.030)
Urobilinogen, Ur: 0.2 mg/dL (ref 0.2–1.0)
pH, UA: 5.5 (ref 5.0–7.5)

## 2016-05-17 LAB — MICROSCOPIC EXAMINATION: WBC, UA: >30 /hpf — AB (ref 0–?)

## 2016-05-17 MED ORDER — SOLIFENACIN SUCCINATE 5 MG PO TABS: 5.0000 mg | ORAL_TABLET | Freq: Every day | ORAL | Status: DC

## 2016-05-17 NOTE — Progress Notes (Signed)
05/17/2016 9:56 AM   [name] 08/22/1951 [id number]  Referring provider: Jearld Fenton, NP [address] [place], [place] [address]  Chief Complaint  Patient presents with  . 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 She can leak with coughing sneezing and sometimes with bending and lifting and laughing. She only wears a pad when she was out in public. She denies enuresis.  She gets up 3 times a night and voids every 23 hours with good flow. She is currently on oxybutynin and failed.  She occasionally gets urinary tract infection. She's not had previous GU surgery or kidney stones. She has no risk factors for neurogenic bladder though she has had a C-spine fusion  Modifying factors: There are no other modifying factors  Associated signs and symptoms: There are no other associated signs and symptoms Aggravating and relieving factors: There are no other aggravating or relieving factors Severity: Moderate Duration: Persistent     PMH: Past Medical History  Diagnosis Date  . Depression   . Thyroid disease     Surgical History: Past Surgical History  Procedure Laterality Date  . Tonsillectomy  1961  . Abdominal hysterectomy  1992    Total  . Cervical fusion  2005,2010    C2-C5  . Breast biopsy Right 1960's    negative  . Breast biopsy Right 07/20/2015    path pending    Home Medications:    Medication List       This list is accurate as of: 05/17/16  9:56 AM.  Always use your most recent med list.               buPROPion 300 MG 24 hr tablet  Commonly known as:  WELLBUTRIN XL  Take 1 tablet (300 mg total) by mouth daily.     clonazePAM 0.5 MG tablet  Commonly known as:  KLONOPIN  Take 1 tablet (0.5 mg total) by mouth as needed for anxiety.     FLUoxetine 40 MG capsule  Commonly known as:  PROZAC  Take 1 capsule (40 mg total) by mouth daily.     levothyroxine 88 MCG tablet  Commonly known as:  SYNTHROID, LEVOTHROID  Take 1 tablet by mouth  daily before breakfast     oxybutynin 10 MG 24 hr tablet  Commonly known as:  DITROPAN-XL  Take 1 tablet (10 mg total) by mouth at bedtime.        Allergies:  Allergies  Allergen Reactions  . Codeine Hives  . Iodinated Diagnostic Agents Itching    Family History: Family History  Problem Relation Age of Onset  . Cancer Mother     Uterine, Ovarian  . Heart disease Father   . Stroke Father   . Diabetes Father   . Cancer Maternal Grandmother   . Prostate cancer Neg Hx   . Kidney cancer Neg Hx     Social History:  reports that she has never smoked. She has never used smokeless tobacco. She reports that she drinks alcohol. She reports that she does not use illicit drugs.  ROS: UROLOGY Frequent Urination?: Yes Hard to postpone urination?: Yes Burning/pain with urination?: No Get up at night to urinate?: Yes Leakage of urine?: Yes Urine stream starts and stops?: No Trouble starting stream?: No Do you have to strain to urinate?: No Blood in urine?: No  Urinary tract infection?: Yes Sexually transmitted disease?: No Injury to kidneys or bladder?: No Painful intercourse?: No Weak stream?: No Currently pregnant?: No Vaginal bleeding?: No Last menstrual period?: hysterectomy  Gastrointestinal Nausea?: No Vomiting?: No Indigestion/heartburn?: No Diarrhea?: No Constipation?: No  Constitutional Fever: No Night sweats?: No Weight loss?: No Fatigue?: No  Skin Skin rash/lesions?: No Itching?: No  Eyes Blurred vision?: No Double vision?: No  Ears/Nose/Throat Sore throat?: No Sinus problems?: No  Hematologic/Lymphatic Swollen glands?: No Easy bruising?: No  Cardiovascular Leg swelling?: No Chest pain?: No  Respiratory Cough?: No Shortness of breath?: No  Endocrine Excessive thirst?: No  Musculoskeletal Back pain?: No Joint pain?:  No  Neurological Headaches?: No Dizziness?: No  Psychologic Depression?: No Anxiety?: No  Physical Exam: BP 118/78 mmHg  Pulse 66  Ht 5\' 6"  (1.676 m)  Wt 147 lb (66.679 kg)  BMI 23.74 kg/m2  Constitutional:  Alert and oriented, No acute distress. HEENT: [place] AT, moist mucus membranes.  Trachea midline, no masses. Cardiovascular: No clubbing, cyanosis, or edema. Respiratory: Normal respiratory effort, no increased work of breathing. GI: Abdomen is soft, nontender, nondistended, no abdominal masses GU: No CVA tenderness. 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 No stress incontinence or prolapse Skin: No rashes, bruises or suspicious lesions. Lymph: No cervical or inguinal adenopathy. Neurologic: Grossly intact, no focal deficits, moving all 4 extremities. Psychiatric: Normal mood and affect.  Laboratory Data: Lab Results  Component Value Date   WBC 10.3 04/29/2010   HGB 14.0 04/29/2010   HCT 40.2 04/29/2010   MCV 95.7 04/29/2010   PLT 129* 04/29/2010    Lab Results  Component Value Date   CREATININE 0.97 04/18/2016    No results found for: PSA  No results found for: TESTOSTERONE  No results found for: HGBA1C  Urinalysis    Component Value Date/Time   COLORURINE YELLOW 04/29/2010 [place] 04/29/2010 0749   LABSPEC 1.028 04/29/2010 [place] 7.5 04/29/2010 [place] 04/29/2010 0749   HGBUR NEGATIVE 04/29/2010 0749   BILIRUBINUR Negavite 03/20/2016 [place] 04/29/2010 [place] 04/29/2010 0749   PROTEINUR  03/20/2016 1043     Comment:     Unable to read due to pt taking [place] 04/29/2010 0749   UROBILINOGEN  03/20/2016 1043     Comment:     Unable to read due to pt taking AZO   UROBILINOGEN 1.0 04/29/2010 0749   NITRITE  03/20/2016 1043     Comment:     Unable to read due to pt taking AZO   NITRITE NEGATIVE 04/29/2010 0749   LEUKOCYTESUR moderate (2+)*  03/20/2016 1043    Pertinent Imaging: None  Assessment & Plan:  The patient has mild stress incontinence but primarily is bothered by urgency and almost daily urge incontinence. She occasionally gets a urinary tract infection. She has failed oxybutynin. She gets up 3 times at night  The patient was offered behavioral and medical therapy and physical therapy consultation. Vesicare samples and prescription given. Reassess in one month. If she reaches her treatment goal one can consider stopping the medication  1. OAB (overactive bladder) 2. Nocturia - Urinalysis, Complete   Return in about 4 weeks (around 06/14/2016).  Reece Packer, MD  [facility] [address], [place], [place] [address] [phone]

## 2016-06-06 LAB — HM COLONOSCOPY

## 2016-06-19 ENCOUNTER — Encounter: Payer: Self-pay | Admitting: Internal Medicine

## 2016-06-26 ENCOUNTER — Ambulatory Visit (INDEPENDENT_AMBULATORY_CARE_PROVIDER_SITE_OTHER): Payer: Managed Care, Other (non HMO) | Admitting: Urology

## 2016-06-26 ENCOUNTER — Encounter: Payer: Self-pay | Admitting: Urology

## 2016-06-26 VITALS — BP 114/72 | HR 62 | Ht 66.0 in | Wt 142.9 lb

## 2016-06-26 DIAGNOSIS — N3281 Overactive bladder: Secondary | ICD-10-CM | POA: Diagnosis not present

## 2016-06-26 LAB — URINALYSIS, COMPLETE
BILIRUBIN UA: NEGATIVE
GLUCOSE, UA: NEGATIVE
Ketones, UA: NEGATIVE
Nitrite, UA: NEGATIVE
PROTEIN UA: NEGATIVE
RBC UA: NEGATIVE
Urobilinogen, Ur: 0.2 mg/dL (ref 0.2–1.0)
pH, UA: 5.5 (ref 5.0–7.5)

## 2016-06-26 LAB — MICROSCOPIC EXAMINATION: Bacteria, UA: NONE SEEN

## 2016-06-26 NOTE — Progress Notes (Signed)
06/26/2016 9:44 AM   [name] May 04, 1951 [id number]  Referring provider: Jearld Fenton, NP [address] [place], [place] [address]  Chief Complaint  Patient presents with  . 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 She can leak with coughing sneezing and sometimes with bending and lifting and laughing. She only wears a pad when she was out in public. She denies enuresis.  She gets up 3 times a night and voids every 23 hours with good flow. She is currently on oxybutynin and failed.  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 No stress incontinence or prolapse   The patient has mild stress incontinence but primarily is bothered by urgency and almost daily urge incontinence. She occasionally gets a urinary tract infection. She has failed oxybutynin. She gets up 3 times at night  The patient was offered behavioral and medical therapy and physical therapy consultation. Vesicare samples and prescription given. Reassess in one month. If she reaches her treatment goal one can consider stopping the medication   PMH: Past Medical History  Diagnosis Date  . Depression   . Thyroid disease     Surgical History: Past Surgical History  Procedure Laterality Date  . Tonsillectomy  1961  . Abdominal hysterectomy  1992    Total  . Cervical fusion  2005,2010    C2-C5  . Breast biopsy Right 1960's    negative  . Breast biopsy Right 07/20/2015    path pending    Home Medications:    Medication List       This list is accurate as of: 06/26/16  9:44 AM.  Always use your most recent med list.               buPROPion 300 MG 24 hr tablet  Commonly known as:  WELLBUTRIN XL  Take 1 tablet (300 mg total) by mouth daily.     FLUoxetine 40 MG capsule  Commonly known as:  PROZAC  Take 1 capsule (40 mg total) by mouth daily.     GAVILYTE-N WITH FLAVOR PACK 420 g solution  Generic drug:   polyethylene glycol-electrolytes  See admin instructions. Reported on 06/26/2016     levothyroxine 88 MCG tablet  Commonly known as:  SYNTHROID, LEVOTHROID  Take 1 tablet by mouth  daily before breakfast     OLANZapine 10 MG tablet  Commonly known as:  ZYPREXA     solifenacin 5 MG tablet  Commonly known as:  VESICARE  Take 1 tablet (5 mg total) by mouth daily.        Allergies:  Allergies  Allergen Reactions  . Codeine Hives  . Iodinated Diagnostic Agents Itching    Family History: Family History  Problem Relation Age of Onset  . Cancer Mother     Uterine, Ovarian  . Heart disease Father   . Stroke Father   . Diabetes Father   . Cancer Maternal Grandmother   . Prostate cancer Neg Hx   . Kidney cancer Neg Hx     Social History:  reports that she has never smoked. She has never used smokeless tobacco. She reports that she drinks alcohol. She reports that she does not use illicit drugs.  ROS: UROLOGY Frequent Urination?: No Hard to postpone urination?: No Burning/pain with urination?: No Get up at night to urinate?: No Leakage of urine?: No Urine stream starts  and stops?: No Trouble starting stream?: No Do you have to strain to urinate?: No Blood in urine?: No Urinary tract infection?: No Sexually transmitted disease?: No Injury to kidneys or bladder?: No Painful intercourse?: No Weak stream?: No Currently pregnant?: No Vaginal bleeding?: No Last menstrual period?: No  Gastrointestinal Nausea?: No Vomiting?: No Indigestion/heartburn?: No Diarrhea?: No  Constitutional Fever: No Night sweats?: No Weight loss?: No Fatigue?: No  Skin Skin rash/lesions?: No Itching?: No  Eyes Blurred vision?: No Double vision?: No  Ears/Nose/Throat Sore throat?: No Sinus problems?: No  Hematologic/Lymphatic Swollen glands?: No Easy bruising?: No  Cardiovascular Leg swelling?: No Chest pain?: No  Respiratory Cough?: No Shortness of breath?:  No  Endocrine Excessive thirst?: No  Musculoskeletal Back pain?: No Joint pain?: No  Neurological Headaches?: No Dizziness?: No  Psychologic Depression?: No Anxiety?: No  Physical Exam: BP 114/72 mmHg  Pulse 62  Ht 5\' 6"  (1.676 m)  Wt 142 lb 14.4 oz (64.819 kg)  BMI 23.08 kg/m2   Laboratory Data: Lab Results  Component Value Date   WBC 10.3 04/29/2010   HGB 14.0 04/29/2010   HCT 40.2 04/29/2010   MCV 95.7 04/29/2010   PLT 129* 04/29/2010    Lab Results  Component Value Date   CREATININE 0.97 04/18/2016    No results found for: PSA  No results found for: TESTOSTERONE  No results found for: HGBA1C  Urinalysis    Component Value Date/Time   COLORURINE YELLOW 04/29/2010 0749   APPEARANCEUR Cloudy* 05/17/2016 0916   APPEARANCEUR CLEAR 04/29/2010 0749   LABSPEC 1.028 04/29/2010 0749   PHURINE 7.5 04/29/2010 0749   GLUCOSEU Negative 05/17/2016 0916   HGBUR NEGATIVE 04/29/2010 0749   BILIRUBINUR Negative 05/17/2016 0916   BILIRUBINUR Negavite 03/20/2016 [place] 04/29/2010 [place] 04/29/2010 0749   PROTEINUR Negative 05/17/2016 0916   PROTEINUR  03/20/2016 1043     Comment:     Unable to read due to pt taking AZO   PROTEINUR NEGATIVE 04/29/2010 0749   UROBILINOGEN  03/20/2016 1043     Comment:     Unable to read due to pt taking AZO   UROBILINOGEN 1.0 04/29/2010 0749   NITRITE Negative 05/17/2016 0916   NITRITE  03/20/2016 1043     Comment:     Unable to read due to pt taking AZO   NITRITE NEGATIVE 04/29/2010 0749   LEUKOCYTESUR 3+* 05/17/2016 0916   LEUKOCYTESUR moderate (2+)* 03/20/2016 1043    Pertinent Imaging: none  Assessment & Plan:  Patient is dramatically better and almost completely dry. Frequency stable. Clinically noninfected  Seen 1 year Vesicare  1. OAB (overactive bladder)  - Urinalysis, Complete   No Follow-up on file.  Reece Packer, MD  [facility] [address], [place], [place] [address] [phone]

## 2016-07-07 ENCOUNTER — Encounter: Payer: Self-pay | Admitting: Internal Medicine

## 2016-07-10 ENCOUNTER — Other Ambulatory Visit: Payer: Self-pay | Admitting: Internal Medicine

## 2016-07-10 DIAGNOSIS — Z1231 Encounter for screening mammogram for malignant neoplasm of breast: Secondary | ICD-10-CM

## 2016-07-13 ENCOUNTER — Ambulatory Visit: Payer: Managed Care, Other (non HMO) | Admitting: Primary Care

## 2016-08-04 ENCOUNTER — Other Ambulatory Visit: Payer: Self-pay

## 2016-08-04 ENCOUNTER — Other Ambulatory Visit: Payer: Self-pay | Admitting: Internal Medicine

## 2016-08-04 DIAGNOSIS — F32A Depression, unspecified: Secondary | ICD-10-CM

## 2016-08-04 DIAGNOSIS — N3281 Overactive bladder: Secondary | ICD-10-CM

## 2016-08-04 DIAGNOSIS — F419 Anxiety disorder, unspecified: Principal | ICD-10-CM

## 2016-08-04 DIAGNOSIS — F329 Major depressive disorder, single episode, unspecified: Secondary | ICD-10-CM

## 2016-08-04 MED ORDER — SOLIFENACIN SUCCINATE 10 MG PO TABS: 10.0000 mg | ORAL_TABLET | Freq: Every day | ORAL | 3 refills | Status: DC

## 2016-09-08 ENCOUNTER — Other Ambulatory Visit: Payer: Self-pay | Admitting: Internal Medicine

## 2016-09-14 ENCOUNTER — Ambulatory Visit
Admission: RE | Admit: 2016-09-14 | Discharge: 2016-09-14 | Disposition: A | Discharge status: Home / Self Care | Payer: Managed Care, Other (non HMO) | Source: Ambulatory Visit | Attending: Internal Medicine | Admitting: Internal Medicine

## 2016-09-14 ENCOUNTER — Other Ambulatory Visit: Payer: Self-pay | Admitting: Internal Medicine

## 2016-09-14 DIAGNOSIS — Z9889 Other specified postprocedural states: Secondary | ICD-10-CM | POA: Diagnosis not present

## 2016-09-14 DIAGNOSIS — Z1231 Encounter for screening mammogram for malignant neoplasm of breast: Secondary | ICD-10-CM | POA: Diagnosis present

## 2016-09-25 ENCOUNTER — Other Ambulatory Visit: Payer: Self-pay | Admitting: Internal Medicine

## 2016-09-25 ENCOUNTER — Encounter: Payer: Self-pay | Admitting: Internal Medicine

## 2016-09-25 DIAGNOSIS — Z0000 Encounter for general adult medical examination without abnormal findings: Secondary | ICD-10-CM

## 2016-10-09 ENCOUNTER — Ambulatory Visit (INDEPENDENT_AMBULATORY_CARE_PROVIDER_SITE_OTHER): Payer: Managed Care, Other (non HMO) | Admitting: Internal Medicine

## 2016-10-09 ENCOUNTER — Encounter: Payer: Self-pay | Admitting: Internal Medicine

## 2016-10-09 ENCOUNTER — Other Ambulatory Visit: Payer: Self-pay | Admitting: Internal Medicine

## 2016-10-09 VITALS — BP 120/84 | HR 74 | Temp 98.7°F | Ht 65.75 in | Wt 148.2 lb

## 2016-10-09 DIAGNOSIS — Z23 Encounter for immunization: Secondary | ICD-10-CM

## 2016-10-09 DIAGNOSIS — Z0000 Encounter for general adult medical examination without abnormal findings: Secondary | ICD-10-CM

## 2016-10-09 DIAGNOSIS — Z780 Asymptomatic menopausal state: Secondary | ICD-10-CM

## 2016-10-09 LAB — LIPID PANEL
CHOLESTEROL: 264 mg/dL — AB (ref 0–200)
HDL: 93 mg/dL — AB (ref 35–70)

## 2016-10-09 LAB — TSH: TSH: 1.78 u[IU]/mL (ref <=5.90)

## 2016-10-09 NOTE — Progress Notes (Signed)
Subjective:    Patient ID: [name], female    DOB: 07/18/1951, 65 y.o.   MRN: [id number]  HPI  Pt presents to the clinic today for her annual exam.  Flu: never Tetanus: < 10 years ago Pap Smear: 1992, hysterectomy Mammogram: 09/2016 Bone Density: never Zostovax: never Colon Screening: 05/2016 Vision Screening: annually Dentist: biannually  Diet: She does eat meat. She consumes fruits and veggies daily. She tries to avoid fried food. She drinks mostly unsweet tea and water. Exercise: She tries to walk at least 9 miles a day, runs the stairs.  Review of Systems      Past Medical History:  Diagnosis Date  . Depression   . Thyroid disease     Current Outpatient Prescriptions  Medication Sig Dispense Refill  . acyclovir (ZOVIRAX) 200 MG capsule TAKE 1 CAPSULE BY MOUTH  EVERY DAY AS NEEDED 90 capsule 0  . buPROPion (WELLBUTRIN XL) 300 MG 24 hr tablet Take 1 tablet by mouth  daily 90 tablet 0  . FLUoxetine (PROZAC) 40 MG capsule Take 1 capsule by mouth  daily 90 capsule 0  . GAVILYTE-N WITH FLAVOR PACK 420 g solution See admin instructions. Reported on 06/26/2016  0  . levothyroxine (SYNTHROID, LEVOTHROID) 88 MCG tablet Take 1 tablet by mouth  daily before breakfast 90 tablet 1  . OLANZapine (ZYPREXA) 10 MG tablet     . solifenacin (VESICARE) 10 MG tablet Take 1 tablet (10 mg total) by mouth daily. 90 tablet 3  . solifenacin (VESICARE) 5 MG tablet Take 1 tablet (5 mg total) by mouth daily. 30 tablet 11   No current facility-administered medications for this visit.     Allergies  Allergen Reactions  . Codeine Hives  . Iodinated Diagnostic Agents Itching    Family History  Problem Relation Age of Onset  . Cancer Mother     Uterine, Ovarian  . Heart disease Father   . Stroke Father   . Diabetes Father   . Cancer Maternal Grandmother   . Prostate cancer Neg Hx   . Kidney cancer Neg Hx   . Breast cancer Neg Hx     Social History   Social History  . Marital  status: Married    Spouse name: N/A  . Number of children: N/A  . Years of education: N/A   Occupational History  . Not on file.   Social History Main Topics  . Smoking status: Never Smoker  . Smokeless tobacco: Never Used  . Alcohol use 0.0 oz/week     Comment: 2-3 nights week--1 glass of wine  . Drug use: No  . Sexual activity: Yes   Other Topics Concern  . Not on file   Social History Narrative  . No narrative on file     Constitutional: Denies fever, malaise, fatigue, headache or abrupt weight changes.  HEENT: Denies eye pain, eye redness, ear pain, ringing in the ears, wax buildup, runny nose, nasal congestion, bloody nose, or sore throat. Respiratory: Denies difficulty breathing, shortness of breath, cough or sputum production.   Cardiovascular: Denies chest pain, chest tightness, palpitations or swelling in the hands or feet.  Gastrointestinal: Pt reports difficulty swallowing. Denies abdominal pain, bloating, constipation, diarrhea or blood in the stool.  GU: Denies urgency, frequency, pain with urination, burning sensation, blood in urine, odor or discharge. Musculoskeletal: Denies decrease in range of motion, difficulty with gait, muscle pain or joint pain and swelling.  Skin: Denies redness, rashes, lesions or ulcercations.  Neurological: Denies dizziness, difficulty with memory, difficulty with speech or problems with balance and coordination.  Psych: Pt has history of anxiety and depression. Denies SI/HI.  No other specific complaints in a complete review of systems (except as listed in HPI above).  Objective:   Physical Exam  BP 120/84   Pulse 74   Temp 98.7 F (37.1 C) (Oral)   Ht 5' 5.75" (1.67 m)   Wt 148 lb 4 oz (67.2 kg)   SpO2 98%   BMI 24.11 kg/m  Wt Readings from Last 3 Encounters:  10/09/16 148 lb 4 oz (67.2 kg)  06/26/16 142 lb 14.4 oz (64.8 kg)  05/17/16 147 lb (66.7 kg)    General: Appears her stated age, well developed, well nourished  in NAD. Skin: Warm, dry and intact.  HEENT: Head: normal shape and size; Eyes: sclera white, no icterus, conjunctiva pink, PERRLA and EOMs intact; Ears: Tm's gray and intact, normal light reflex; Throat/Mouth: Teeth present, mucosa pink and moist, no exudate, lesions or ulcerations noted.  Neck:  Neck supple, trachea midline. No masses, lumps or thyromegaly present.  Cardiovascular: Normal rate and rhythm. S1,S2 noted.  No murmur, rubs or gallops noted. No JVD or BLE edema. No carotid bruits noted. Pulmonary/Chest: Normal effort and positive vesicular breath sounds. No respiratory distress. No wheezes, rales or ronchi noted.  Abdomen: Soft and nontender. Normal bowel sounds. No distention or masses noted. Liver, spleen and kidneys non palpable. Musculoskeletal: Normal range of motion. Strength 5/5 BUE/BLE. No difficulty with gait.  Neurological: Alert and oriented. Cranial nerves II-XII grossly intact. Coordination normal.  Psychiatric: Mood and affect normal. Behavior is normal. Judgment and thought content normal.     BMET    Component Value Date/Time   NA 141 04/18/2016 1333   K 3.8 04/18/2016 1333   CL 104 04/18/2016 1333   CO2 29 04/18/2016 1333   GLUCOSE 96 04/18/2016 1333   BUN 15 04/18/2016 1333   CREATININE 0.97 04/18/2016 1333   CALCIUM 9.3 04/18/2016 1333    Lipid Panel  No results found for: CHOL, TRIG, HDL, CHOLHDL, VLDL, LDLCALC  CBC    Component Value Date/Time   WBC 10.3 04/29/2010 0749   RBC 4.20 04/29/2010 0749   HGB 14.0 04/29/2010 0749   HCT 40.2 04/29/2010 0749   PLT 129 (L) 04/29/2010 0749   MCV 95.7 04/29/2010 0749   MCHC 34.9 04/29/2010 0749   RDW 13.3 04/29/2010 0749    Hgb A1C No results found for: HGBA1C      Assessment & Plan:   Preventative Health Maintenance:  She declines flu shot today Tdap today Pap smear and mammogram up to date She will call insurance about shingles vaccine Bone density ordered- she will call [facility] to  schedule Colon screening up to date Encouraged her to continue balanced diet and exercise program Advised her to see an eye doctor and dentist annually Labs drawn at [facility] today, will add on Hep C  RTC in 1 year, sooner if needed Webb Silversmith, NP

## 2016-10-09 NOTE — Patient Instructions (Signed)

## 2016-10-10 LAB — VITAMIN D 25 HYDROXY (VIT D DEFICIENCY, FRACTURES): VIT D 25 HYDROXY: 39.5 ng/mL (ref 30.0–100.0)

## 2016-10-10 LAB — LIPID PANEL
Chol/HDL Ratio: 2.8 ratio units (ref 0.0–4.4)
Cholesterol, Total: 264 mg/dL — ABNORMAL HIGH (ref 100–199)
HDL: 93 mg/dL (ref >39)
LDL CALC: 155 mg/dL — AB (ref 0–99)
Triglycerides: 79 mg/dL (ref 0–149)
VLDL Cholesterol Cal: 16 mg/dL (ref 5–40)

## 2016-10-10 LAB — H. PYLORI ANTIBODY, IGG: H Pylori IgG: <0.9 U/mL (ref 0.0–0.8)

## 2016-10-10 LAB — HIV ANTIBODY (ROUTINE TESTING W REFLEX): HIV Screen 4th Generation wRfx: NONREACTIVE

## 2016-10-10 LAB — TSH: TSH: 1.78 u[IU]/mL (ref 0.450–4.500)

## 2016-10-11 ENCOUNTER — Encounter: Payer: Self-pay | Admitting: Internal Medicine

## 2016-10-11 LAB — HEPATITIS C ANTIBODY

## 2016-10-11 LAB — SPECIMEN STATUS REPORT

## 2016-10-13 ENCOUNTER — Telehealth: Payer: Self-pay

## 2016-10-13 DIAGNOSIS — Z23 Encounter for immunization: Secondary | ICD-10-CM

## 2016-10-13 NOTE — Addendum Note (Signed)
Addended by: Lurlean Nanny on: 10/13/2016 10:35 AM   Modules accepted: Orders

## 2016-10-17 NOTE — Telephone Encounter (Signed)
error 

## 2016-10-23 ENCOUNTER — Other Ambulatory Visit (INDEPENDENT_AMBULATORY_CARE_PROVIDER_SITE_OTHER): Payer: Managed Care, Other (non HMO)

## 2016-10-23 DIAGNOSIS — Z0000 Encounter for general adult medical examination without abnormal findings: Secondary | ICD-10-CM

## 2016-10-24 LAB — CBC
HEMATOCRIT: 40.6 % (ref 34.0–46.6)
Hemoglobin: 13.2 g/dL (ref 11.1–15.9)
MCH: 29.9 pg (ref 26.6–33.0)
MCHC: 32.5 g/dL (ref 31.5–35.7)
MCV: 92 fL (ref 79–97)
Platelets: 218 10*3/uL (ref 150–379)
RBC: 4.41 x10E6/uL (ref 3.77–5.28)
RDW: 13.2 % (ref 12.3–15.4)
WBC: 5.9 10*3/uL (ref 3.4–10.8)

## 2016-10-24 LAB — COMPREHENSIVE METABOLIC PANEL
ALBUMIN: 4.2 g/dL (ref 3.6–4.8)
ALK PHOS: 92 IU/L (ref 39–117)
ALT: 24 IU/L (ref 0–32)
AST: 19 IU/L (ref 0–40)
Albumin/Globulin Ratio: 2.2 (ref 1.2–2.2)
BUN / CREAT RATIO: 21 (ref 12–28)
BUN: 17 mg/dL (ref 8–27)
Bilirubin Total: 0.3 mg/dL (ref 0.0–1.2)
CALCIUM: 9.2 mg/dL (ref 8.7–10.3)
CO2: 24 mmol/L (ref 18–29)
CREATININE: 0.81 mg/dL (ref 0.57–1.00)
Chloride: 101 mmol/L (ref 96–106)
GFR, EST AFRICAN AMERICAN: 89 mL/min/{1.73_m2} (ref >59)
GFR, EST NON AFRICAN AMERICAN: 78 mL/min/{1.73_m2} (ref >59)
GLOBULIN, TOTAL: 1.9 g/dL (ref 1.5–4.5)
GLUCOSE: 86 mg/dL (ref 65–99)
Potassium: 4.5 mmol/L (ref 3.5–5.2)
SODIUM: 140 mmol/L (ref 134–144)
TOTAL PROTEIN: 6.1 g/dL (ref 6.0–8.5)

## 2016-10-27 ENCOUNTER — Encounter: Payer: Self-pay | Admitting: Internal Medicine

## 2016-11-15 ENCOUNTER — Ambulatory Visit (INDEPENDENT_AMBULATORY_CARE_PROVIDER_SITE_OTHER): Payer: Managed Care, Other (non HMO) | Admitting: Internal Medicine

## 2016-11-15 DIAGNOSIS — Z2911 Encounter for prophylactic immunotherapy for respiratory syncytial virus (RSV): Secondary | ICD-10-CM

## 2016-11-15 DIAGNOSIS — Z23 Encounter for immunization: Secondary | ICD-10-CM

## 2016-11-15 NOTE — Progress Notes (Signed)
Zostavax given at nurse visit. Chart reviewed.   Webb Silversmith, NP

## 2016-11-30 ENCOUNTER — Other Ambulatory Visit: Payer: Self-pay | Admitting: Neurosurgery

## 2016-11-30 DIAGNOSIS — M542 Cervicalgia: Secondary | ICD-10-CM

## 2016-12-06 ENCOUNTER — Ambulatory Visit: Payer: Managed Care, Other (non HMO)

## 2016-12-06 ENCOUNTER — Ambulatory Visit
Admission: RE | Admit: 2016-12-06 | Discharge: 2016-12-06 | Disposition: A | Discharge status: Home / Self Care | Payer: Managed Care, Other (non HMO) | Source: Ambulatory Visit | Attending: Internal Medicine | Admitting: Internal Medicine

## 2016-12-06 DIAGNOSIS — Z780 Asymptomatic menopausal state: Secondary | ICD-10-CM

## 2016-12-20 ENCOUNTER — Telehealth: Payer: Self-pay | Admitting: Urology

## 2016-12-20 NOTE — Telephone Encounter (Signed)
Patient called today and wanted you to know that the 5mg  once a day of the vesicare was not working for her, so she increased it to 10mg  once a day and she said that it was working.  She just wanted you to be aware that she made this change on her own.  Thanks,  Sharyn Lull

## 2016-12-21 ENCOUNTER — Ambulatory Visit
Admission: RE | Admit: 2016-12-21 | Discharge: 2016-12-21 | Disposition: A | Discharge status: Home / Self Care | Payer: Managed Care, Other (non HMO) | Source: Ambulatory Visit | Attending: Neurosurgery | Admitting: Neurosurgery

## 2016-12-21 DIAGNOSIS — M47892 Other spondylosis, cervical region: Secondary | ICD-10-CM | POA: Diagnosis not present

## 2016-12-21 DIAGNOSIS — M542 Cervicalgia: Secondary | ICD-10-CM

## 2016-12-21 DIAGNOSIS — M50222 Other cervical disc displacement at C5-C6 level: Secondary | ICD-10-CM | POA: Diagnosis not present

## 2016-12-21 DIAGNOSIS — Z9889 Other specified postprocedural states: Secondary | ICD-10-CM | POA: Insufficient documentation

## 2016-12-22 ENCOUNTER — Other Ambulatory Visit: Payer: Self-pay | Admitting: Internal Medicine

## 2016-12-29 ENCOUNTER — Other Ambulatory Visit: Payer: Self-pay | Admitting: Internal Medicine

## 2016-12-29 DIAGNOSIS — F329 Major depressive disorder, single episode, unspecified: Secondary | ICD-10-CM

## 2016-12-29 DIAGNOSIS — F419 Anxiety disorder, unspecified: Principal | ICD-10-CM

## 2016-12-29 DIAGNOSIS — F32A Depression, unspecified: Secondary | ICD-10-CM

## 2017-02-06 ENCOUNTER — Other Ambulatory Visit: Payer: Self-pay | Admitting: Internal Medicine

## 2017-03-08 ENCOUNTER — Emergency Department (HOSPITAL_COMMUNITY): Payer: Managed Care, Other (non HMO)

## 2017-03-08 ENCOUNTER — Emergency Department (HOSPITAL_COMMUNITY)
Admission: EM | Admit: 2017-03-08 | Discharge: 2017-03-08 | Disposition: A | Discharge status: Home / Self Care | Payer: Managed Care, Other (non HMO) | Attending: Emergency Medicine | Admitting: Emergency Medicine

## 2017-03-08 ENCOUNTER — Telehealth: Payer: Self-pay | Admitting: Internal Medicine

## 2017-03-08 ENCOUNTER — Encounter (HOSPITAL_COMMUNITY): Payer: Self-pay

## 2017-03-08 DIAGNOSIS — R05 Cough: Secondary | ICD-10-CM | POA: Diagnosis present

## 2017-03-08 DIAGNOSIS — E039 Hypothyroidism, unspecified: Secondary | ICD-10-CM | POA: Diagnosis not present

## 2017-03-08 DIAGNOSIS — G8918 Other acute postprocedural pain: Secondary | ICD-10-CM | POA: Diagnosis not present

## 2017-03-08 DIAGNOSIS — Z79899 Other long term (current) drug therapy: Secondary | ICD-10-CM | POA: Insufficient documentation

## 2017-03-08 DIAGNOSIS — J189 Pneumonia, unspecified organism: Secondary | ICD-10-CM | POA: Diagnosis not present

## 2017-03-08 LAB — URINALYSIS, ROUTINE W REFLEX MICROSCOPIC
BILIRUBIN URINE: NEGATIVE
Bacteria, UA: NONE SEEN
GLUCOSE, UA: NEGATIVE mg/dL
HGB URINE DIPSTICK: NEGATIVE
Ketones, ur: 5 mg/dL — AB
LEUKOCYTES UA: NEGATIVE
Nitrite: NEGATIVE
Protein, ur: 30 mg/dL — AB
SPECIFIC GRAVITY, URINE: 1.023 (ref 1.005–1.030)
pH: 6 (ref 5.0–8.0)

## 2017-03-08 LAB — CBC
HEMATOCRIT: 46.9 % — AB (ref 36.0–46.0)
HEMOGLOBIN: 15.8 g/dL — AB (ref 12.0–15.0)
MCH: 31 pg (ref 26.0–34.0)
MCHC: 33.7 g/dL (ref 30.0–36.0)
MCV: 92.1 fL (ref 78.0–100.0)
Platelets: 227 10*3/uL (ref 150–400)
RBC: 5.09 MIL/uL (ref 3.87–5.11)
RDW: 12.9 % (ref 11.5–15.5)
WBC: 14.1 10*3/uL — ABNORMAL HIGH (ref 4.0–10.5)

## 2017-03-08 LAB — COMPREHENSIVE METABOLIC PANEL
ALT: 14 U/L (ref 14–54)
ANION GAP: 10 (ref 5–15)
AST: 16 U/L (ref 15–41)
Albumin: 3.7 g/dL (ref 3.5–5.0)
Alkaline Phosphatase: 74 U/L (ref 38–126)
BUN: 15 mg/dL (ref 6–20)
CO2: 24 mmol/L (ref 22–32)
Calcium: 9.4 mg/dL (ref 8.9–10.3)
Chloride: 103 mmol/L (ref 101–111)
Creatinine, Ser: 0.87 mg/dL (ref 0.44–1.00)
GFR calc Af Amer: >60 mL/min (ref >60)
GFR calc non Af Amer: >60 mL/min (ref >60)
Glucose, Bld: 115 mg/dL — ABNORMAL HIGH (ref 65–99)
POTASSIUM: 4 mmol/L (ref 3.5–5.1)
Sodium: 137 mmol/L (ref 135–145)
TOTAL PROTEIN: 6.7 g/dL (ref 6.5–8.1)
Total Bilirubin: 1.1 mg/dL (ref 0.3–1.2)

## 2017-03-08 LAB — I-STAT CG4 LACTIC ACID, ED
LACTIC ACID, VENOUS: 1 mmol/L (ref 0.5–1.9)
LACTIC ACID, VENOUS: 0.83 mmol/L (ref 0.5–1.9)

## 2017-03-08 LAB — RAPID STREP SCREEN (MED CTR MEBANE ONLY): Streptococcus, Group A Screen (Direct): NEGATIVE

## 2017-03-08 LAB — POC OCCULT BLOOD, ED: Fecal Occult Bld: NEGATIVE

## 2017-03-08 MED ORDER — LEVOFLOXACIN 750 MG PO TABS
750.0000 mg | ORAL_TABLET | Freq: Once | ORAL | Status: AC
  Administered 2017-03-08: 750 mg via ORAL
  Filled 2017-03-08: qty 1

## 2017-03-08 MED ORDER — DIPHENHYDRAMINE HCL 25 MG PO CAPS: 50.0000 mg | ORAL_CAPSULE | Freq: Once | ORAL | Status: DC

## 2017-03-08 MED ORDER — SODIUM CHLORIDE 0.9 % IV BOLUS (SEPSIS)
1000.0000 mL | Freq: Once | INTRAVENOUS | Status: AC
  Administered 2017-03-08: 1000 mL via INTRAVENOUS

## 2017-03-08 MED ORDER — HYDROCORTISONE NA SUCCINATE PF 100 MG IJ SOLR
200.0000 mg | INTRAMUSCULAR | Status: AC
  Administered 2017-03-08: 200 mg via INTRAVENOUS
  Filled 2017-03-08: qty 4

## 2017-03-08 MED ORDER — ACETAMINOPHEN 325 MG PO TABS
650.0000 mg | ORAL_TABLET | Freq: Once | ORAL | Status: AC
  Administered 2017-03-08: 650 mg via ORAL
  Filled 2017-03-08: qty 2

## 2017-03-08 MED ORDER — LEVOFLOXACIN IN D5W 750 MG/150ML IV SOLN
750.0000 mg | Freq: Once | INTRAVENOUS | Status: DC
  Filled 2017-03-08: qty 150

## 2017-03-08 MED ORDER — LEVOFLOXACIN 750 MG PO TABS: 750.0000 mg | ORAL_TABLET | Freq: Once | ORAL | Status: DC

## 2017-03-08 MED ORDER — LEVOFLOXACIN 500 MG PO TABS: 500.0000 mg | ORAL_TABLET | Freq: Every day | ORAL | 0 refills | Status: DC

## 2017-03-08 NOTE — Telephone Encounter (Signed)
I don't see an ER note. Can you call and see if she went to ER or contacted her surgeon?

## 2017-03-08 NOTE — ED Provider Notes (Signed)
[facility] DEPT Provider Note   CSN: [id number] Arrival date & time: 03/08/17  1224     History   Chief Complaint Chief Complaint  Patient presents with  . gi bleed/ po-op spinal fusion    HPI [name] is a 66 y.o. female.  HPI  66 year old female one week post op anterior cervical fusion presents today with cough, sore throat, and fever. Patient became nauseated yesterday and had emesis that contained mucus. Later in the day yesterday, she took some reddish medicine and then had emesis that was read followed by emesis that she describes as coffee-ground. She has not had any dark tarry stool or diarrhea. She states that she is unable to swallow things today. Patient contacted her primary care office and was told to come to the emergency department. She has no history of GI bleeding and is not on any nonsteroidals. She was taking oxycodone postoperatively but has not had any for at least a day and a half.  Past Medical History:  Diagnosis Date  . Depression   . Thyroid disease     Patient Active Problem List   Diagnosis Date Noted  . OAB (overactive bladder) 04/18/2016  . Anxiety and depression 07/30/2014  . Hypothyroidism 07/30/2014    Past Surgical History:  Procedure Laterality Date  . ABDOMINAL HYSTERECTOMY  1992   Total  . BREAST BIOPSY Right 1960's   negative  . BREAST BIOPSY Right 07/20/2015   neg  . CERVICAL FUSION  2005,2010   C2-C5  . REDUCTION MAMMAPLASTY Bilateral 2016  . TONSILLECTOMY  1961    OB History    No data available       Home Medications    Prior to Admission medications   Medication Sig Start Date End Date Taking? Authorizing Provider  acyclovir (ZOVIRAX) 200 MG capsule TAKE 1 CAPSULE BY MOUTH  EVERY DAY AS NEEDED 02/07/17  Yes Jearld Fenton, NP  buPROPion (WELLBUTRIN XL) 300 MG 24 hr tablet TAKE 1 TABLET BY MOUTH  DAILY 12/30/16  Yes Jearld Fenton, NP  dexamethasone (DECADRON) 2 MG tablet Take 2 mg by mouth 3 (three) times  daily. 03/02/17  Yes Historical Provider, MD  DUREZOL 0.05 % EMUL Place 1 drop into both eyes 2 (two) times daily. 01/08/17  Yes Historical Provider, MD  FLUoxetine (PROZAC) 40 MG capsule TAKE 1 CAPSULE BY MOUTH  DAILY 12/30/16  Yes Jearld Fenton, NP  ibuprofen (ADVIL,MOTRIN) 200 MG tablet Take 200 mg by mouth every 6 (six) hours as needed for moderate pain.   Yes Historical Provider, MD  levothyroxine (SYNTHROID, LEVOTHROID) 88 MCG tablet TAKE 1 TABLET BY MOUTH  DAILY BEFORE BREAKFAST 02/07/17  Yes Jearld Fenton, NP  oxyCODONE (OXY IR/ROXICODONE) 5 MG immediate release tablet Take 5 mg by mouth every 6 (six) hours as needed for pain. 03/01/17  Yes Historical Provider, MD  solifenacin (VESICARE) 10 MG tablet Take 1 tablet (10 mg total) by mouth daily. Patient taking differently: Take 5 mg by mouth daily.  08/04/16  Yes Bjorn Loser, MD    Family History Family History  Problem Relation Age of Onset  . Cancer Mother     Uterine, Ovarian  . Heart disease Father   . Stroke Father   . Diabetes Father   . Cancer Maternal Grandmother   . Prostate cancer Neg Hx   . Kidney cancer Neg Hx   . Breast cancer Neg Hx     Social History Social History  Substance Use  Topics  . Smoking status: Never Smoker  . Smokeless tobacco: Never Used  . Alcohol use 0.0 oz/week     Comment: 2-3 nights week--1 glass of wine     Allergies   Codeine; Iodinated diagnostic agents; and Metrizamide   Review of Systems Review of Systems  Constitutional: Positive for activity change, appetite change, fatigue and fever.  HENT: Positive for congestion and sore throat.   Eyes: Negative.   Respiratory: Positive for cough and shortness of breath.   Cardiovascular: Negative.   Gastrointestinal: Negative.   Endocrine: Negative.   Genitourinary: Negative.   Musculoskeletal: Negative.   Skin: Negative.   Allergic/Immunologic: Negative.   Neurological: Negative.   Hematological: Negative.     Psychiatric/Behavioral: Negative.   All other systems reviewed and are negative.    Physical Exam Updated Vital Signs BP 113/68   Pulse 85   Temp 100.1 F (37.8 C) (Rectal)   Resp 18   SpO2 92%   Physical Exam  Constitutional: She is oriented to person, place, and time. She appears well-developed and well-nourished. No distress.  HENT:  Head: Normocephalic and atraumatic.  Right Ear: External ear normal.  Left Ear: External ear normal.  Nose: Nose normal.  Mouth/Throat: Oropharynx is clear and moist.  This membranes are somewhat dry  Eyes: Conjunctivae and EOM are normal. Pupils are equal, round, and reactive to light.  Neck: Normal range of motion. Neck supple.  Healing incision anterior neck with some swelling in the left lateral area. No fluctuance is noted.  Cardiovascular: Normal rate and regular rhythm.   Pulmonary/Chest: Effort normal and breath sounds normal.  Abdominal: Soft. Bowel sounds are normal.  Musculoskeletal: Normal range of motion.  Neurological: She is alert and oriented to person, place, and time. She exhibits normal muscle tone. Coordination normal.  Skin: Skin is warm and dry.  Psychiatric: She has a normal mood and affect. Her behavior is normal. Thought content normal.  Nursing note and vitals reviewed.    ED Treatments / Results  Labs (all labs ordered are listed, but only abnormal results are displayed) Labs Reviewed  COMPREHENSIVE METABOLIC PANEL - Abnormal; Notable for the following:       Result Value   Glucose, Bld 115 (*)    All other components within normal limits  CBC - Abnormal; Notable for the following:    WBC 14.1 (*)    Hemoglobin 15.8 (*)    HCT 46.9 (*)    All other components within normal limits  URINALYSIS, ROUTINE W REFLEX MICROSCOPIC - Abnormal; Notable for the following:    Color, Urine AMBER (*)    Ketones, ur 5 (*)    Protein, ur 30 (*)    Squamous Epithelial / LPF 0-5 (*)    All other components within normal  limits  RAPID STREP SCREEN (NOT AT [facility])  CULTURE, GROUP A STREP ([facility])  OCCULT BLOOD X 1 CARD TO LAB, STOOL  POC OCCULT BLOOD, ED  I-STAT CG4 LACTIC ACID, ED  I-STAT CG4 LACTIC ACID, ED    EKG  EKG Interpretation None       Radiology No results found.  Procedures Procedures (including critical care time)  Medications Ordered in ED Medications  sodium chloride 0.9 % bolus 1,000 mL (1,000 mLs Intravenous New Bag/Given 03/08/17 1658)  diphenhydrAMINE (BENADRYL) capsule 50 mg (not administered)  hydrocortisone sodium succinate (SOLU-CORTEF) 100 MG injection 200 mg (200 mg Intravenous Given 03/08/17 1636)     Initial Impression / Assessment and Plan /  ED Course  I have reviewed the triage vital signs and the nursing notes.  Pertinent labs & imaging results that were available during my care of the patient were reviewed by me and considered in my medical decision making (see chart for details).   this is a 66 year old female with recent anterior cervical spinal fusion who presents today with cough and vomiting. She appears somewhat volume depleted is received IV normal saline. Chest x-Aymee Fomby significant for possible bilateral lower lobe infiltrates consistent with patient's cough and fever.Although patient had recent outpatient surgery, she appears very healthy and is not hypoxic, tachycardic, or hypotensive. She has good access to healthcare and we have discussed need for follow-up or return. Plan treat with Levaquin. Also with neck and sore throat. CT obtained. Neurosurgery has consulted. They feel CT changes are within normal limits. Return precautions and need for follow-up discussed with patient and she and husband voice understanding.  Final Clinical Impressions(s) / ED Diagnoses   Final diagnoses:  HCAP (healthcare-associated pneumonia)  Post-op pain    New Prescriptions New Prescriptions   No medications on file     Pattricia Boss, MD 03/08/17 2102

## 2017-03-08 NOTE — Consult Note (Signed)
CC:  Chief Complaint  Patient presents with  . gi bleed/ po-op spinal fusion    HPI: [name] is a 66 y.o. female who presented to the ER for sore throat, cough, congestion, fatigue and fever (max temp 100.3). S/P ACDF 03/01/2017 by Dr Cyndy Freeze. Reports she had an episode of possible coffee ground emesis yesterday - no other episodes of emesis.  She reports her neck is not bothering her. No concerns with her incision. She denies radicular symptoms. Full strength of extremities. Denies bowel or bladder dysfunction, dyspnea, chest pain. She reports she came here because she thought she had pneumonia.   PMH: Past Medical History:  Diagnosis Date  . Depression   . Thyroid disease     PSH: Past Surgical History:  Procedure Laterality Date  . ABDOMINAL HYSTERECTOMY  1992   Total  . BREAST BIOPSY Right 1960's   negative  . BREAST BIOPSY Right 07/20/2015   neg  . CERVICAL FUSION  2005,2010   C2-C5  . REDUCTION MAMMAPLASTY Bilateral 2016  . TONSILLECTOMY  1961    SH: Social History  Substance Use Topics  . Smoking status: Never Smoker  . Smokeless tobacco: Never Used  . Alcohol use 0.0 oz/week     Comment: 2-3 nights week--1 glass of wine    MEDS: Prior to Admission medications   Medication Sig Start Date End Date Taking? Authorizing Provider  acyclovir (ZOVIRAX) 200 MG capsule TAKE 1 CAPSULE BY MOUTH  EVERY DAY AS NEEDED 02/07/17  Yes Jearld Fenton, NP  buPROPion (WELLBUTRIN XL) 300 MG 24 hr tablet TAKE 1 TABLET BY MOUTH  DAILY 12/30/16  Yes Jearld Fenton, NP  dexamethasone (DECADRON) 2 MG tablet Take 2 mg by mouth 3 (three) times daily. 03/02/17  Yes Historical Provider, MD  DUREZOL 0.05 % EMUL Place 1 drop into both eyes 2 (two) times daily. 01/08/17  Yes Historical Provider, MD  FLUoxetine (PROZAC) 40 MG capsule TAKE 1 CAPSULE BY MOUTH  DAILY 12/30/16  Yes Jearld Fenton, NP  ibuprofen (ADVIL,MOTRIN) 200 MG tablet Take 200 mg by mouth every 6 (six) hours as needed for moderate  pain.   Yes Historical Provider, MD  levothyroxine (SYNTHROID, LEVOTHROID) 88 MCG tablet TAKE 1 TABLET BY MOUTH  DAILY BEFORE BREAKFAST 02/07/17  Yes Jearld Fenton, NP  oxyCODONE (OXY IR/ROXICODONE) 5 MG immediate release tablet Take 5 mg by mouth every 6 (six) hours as needed for pain. 03/01/17  Yes Historical Provider, MD  solifenacin (VESICARE) 10 MG tablet Take 1 tablet (10 mg total) by mouth daily. Patient taking differently: Take 5 mg by mouth daily.  08/04/16  Yes Bjorn Loser, MD    ALLERGY: Allergies  Allergen Reactions  . Codeine Hives  . Iodinated Diagnostic Agents Itching  . Metrizamide Itching    ROS: Review of Systems  Constitutional: Positive for chills, fever and malaise/fatigue.  HENT: Positive for congestion and sore throat. Negative for hearing loss and tinnitus.   Eyes: Negative for blurred vision, double vision, photophobia, pain and discharge.  Respiratory: Positive for cough and sputum production. Negative for hemoptysis, shortness of breath and wheezing.   Cardiovascular: Negative for chest pain, palpitations, orthopnea, claudication, leg swelling and PND.  Gastrointestinal: Negative for heartburn, nausea and vomiting.  Musculoskeletal: Negative for myalgias and neck pain.  Skin: Negative for itching and rash.  Neurological: Negative for dizziness, tingling, tremors, sensory change, speech change, focal weakness, seizures, loss of consciousness and headaches.  Endo/Heme/Allergies: Negative for environmental allergies. Does  not bruise/bleed easily.    Vitals:   03/08/17 1830 03/08/17 1958  BP: 111/69 114/67  Pulse: 84 89  Resp: 15 20  Temp:  (!) 100.7 F (38.2 C)   General appearance: Appears well, NAD, hoarse voice Eyes: PERRL, Fundoscopic: normal Cardiovascular: Regular rate and rhythm without murmurs, rubs, gallops. No edema or variciosities. Distal pulses normal. Pulmonary: Clear to auscultation Musculoskeletal:     Muscle tone upper extremities:  Normal    Muscle tone lower extremities: Normal    Motor exam: Upper Extremities Deltoid Bicep Tricep Grip  Right 5/5 5/5 5/5 5/5  Left 5/5 5/5 5/5 5/5   Lower Extremity IP Quad PF DF EHL  Right 5/5 5/5 5/5 5/5 5/5  Left 5/5 5/5 5/5 5/5 5/5   Neurological Awake, alert, oriented Memory and concentration grossly intact CNII: Visual fields normal CNIII/IV/VI: EOMI CNV: Facial sensation normal CNVII: Symmetric, normal strength CNVIII: Grossly normal CNIX: Normal palate movement CNXI: Trap and SCM strength normal CN XII: Tongue protrusion normal Sensation grossly intact to LT DTR: Normal  SKIN Incision c/d/I  IMAGING: IMPRESSION: Recent C3-4 ACDF.  Small bubbles of air are noted in the interspace. Abnormal fluid collections in the LEFT neck, also containing air, are incompletely characterized on this noncontrast exam. Patency of surrounding vasculature is not established with noncontrast CT.  IMPRESSION/PLAN - 66 y.o. female presented to ER for cough, congestion, fever. Found to have HAP. CT scan of neck was ordered due to sore throat s/p ACDF. No worrisome findings on CT scan (reviewed by Dr Cyndy Freeze). She is neurologically intact. Incision looks good. Sx appear to be due to HAP vs recent surgery. She can follow up with NS outpt as needed. Discussed plan with pt and Dr Jeanell Sparrow.

## 2017-03-08 NOTE — ED Triage Notes (Signed)
Patient had anterior spinal fusion 1 week ago today. Tuesday developed fever and had dark emesis yesterday. Now throat hoarse and complains of throat pain. States that since the emesis she can no longer swallow pills, had no problem with this until yesterday. Alert and oriented, NAD

## 2017-03-08 NOTE — ED Notes (Signed)
Patient transported to CT 

## 2017-03-08 NOTE — Telephone Encounter (Signed)
Pt is in the ED now.

## 2017-03-08 NOTE — Telephone Encounter (Signed)
Patient Name: CARDELIA SASSANO DOB: 08-09-1951 Initial Comment Caller states that his wife is post op spinal infusion. She has a Fever 99, vomiting blood. Nurse Assessment Nurse: Ronnald Ramp, RN, Miranda Date/Time (Eastern Time): 03/08/2017 11:28:11 AM Confirm and document reason for call. If symptomatic, describe symptoms. ---Caller states his wife has had a fever (100-101) since yesterday and sore throat. She has also vomited that had coffee ground emesis. Also with productive cough and congestion. Post op 1 week following spinal fusion. Does the patient have any new or worsening symptoms? ---Yes Will a triage be completed? ---Yes Related visit to physician within the last 2 weeks? ---No Does the PT have any chronic conditions? (i.e. diabetes, asthma, etc.) ---Yes List chronic conditions. ---Spinal fusion, Depression, Thyroid Is this a behavioral health or substance abuse call? ---No Guidelines Guideline Title Affirmed Question Affirmed Notes Vomiting Blood [1] Vomiting AND [2] contains black ("coffee ground") material Final Disposition User Go to ED Now Ronnald Ramp, RN, Miranda Referrals GO TO FACILITY UNDECIDED Disagree/Comply: Comply

## 2017-03-11 LAB — CULTURE, GROUP A STREP (THRC)

## 2017-06-07 ENCOUNTER — Other Ambulatory Visit: Payer: Self-pay

## 2017-06-07 DIAGNOSIS — N3281 Overactive bladder: Secondary | ICD-10-CM

## 2017-06-07 MED ORDER — SOLIFENACIN SUCCINATE 10 MG PO TABS: 10.0000 mg | ORAL_TABLET | Freq: Every day | ORAL | 0 refills | Status: DC

## 2017-06-09 ENCOUNTER — Other Ambulatory Visit: Payer: Self-pay | Admitting: Internal Medicine

## 2017-06-11 ENCOUNTER — Other Ambulatory Visit: Payer: Self-pay | Admitting: Internal Medicine

## 2017-06-11 MED ORDER — LEVOTHYROXINE SODIUM 88 MCG PO TABS: ORAL_TABLET | ORAL | 0 refills | Status: DC

## 2017-06-20 ENCOUNTER — Other Ambulatory Visit: Payer: Self-pay | Admitting: Internal Medicine

## 2017-06-20 DIAGNOSIS — F32A Depression, unspecified: Secondary | ICD-10-CM

## 2017-06-20 DIAGNOSIS — F329 Major depressive disorder, single episode, unspecified: Secondary | ICD-10-CM

## 2017-06-20 DIAGNOSIS — F419 Anxiety disorder, unspecified: Principal | ICD-10-CM

## 2017-06-21 ENCOUNTER — Other Ambulatory Visit: Payer: Self-pay

## 2017-06-21 DIAGNOSIS — N3281 Overactive bladder: Secondary | ICD-10-CM

## 2017-06-26 ENCOUNTER — Ambulatory Visit: Payer: Managed Care, Other (non HMO)

## 2017-06-27 ENCOUNTER — Other Ambulatory Visit: Payer: Self-pay | Admitting: Family Medicine

## 2017-06-27 DIAGNOSIS — N3281 Overactive bladder: Secondary | ICD-10-CM

## 2017-06-27 MED ORDER — SOLIFENACIN SUCCINATE 10 MG PO TABS: 10.0000 mg | ORAL_TABLET | Freq: Every day | ORAL | 0 refills | Status: DC

## 2017-07-02 ENCOUNTER — Encounter: Payer: Self-pay | Admitting: Urology

## 2017-07-02 ENCOUNTER — Ambulatory Visit (INDEPENDENT_AMBULATORY_CARE_PROVIDER_SITE_OTHER): Payer: Managed Care, Other (non HMO) | Admitting: Internal Medicine

## 2017-07-02 ENCOUNTER — Encounter: Payer: Self-pay | Admitting: Internal Medicine

## 2017-07-02 ENCOUNTER — Ambulatory Visit (INDEPENDENT_AMBULATORY_CARE_PROVIDER_SITE_OTHER): Payer: Managed Care, Other (non HMO) | Admitting: Urology

## 2017-07-02 VITALS — BP 110/60 | HR 73 | Ht 67.5 in | Wt 152.4 lb

## 2017-07-02 VITALS — BP 114/65 | HR 76 | Wt 152.8 lb

## 2017-07-02 DIAGNOSIS — H04123 Dry eye syndrome of bilateral lacrimal glands: Secondary | ICD-10-CM | POA: Diagnosis not present

## 2017-07-02 DIAGNOSIS — T887XXA Unspecified adverse effect of drug or medicament, initial encounter: Secondary | ICD-10-CM

## 2017-07-02 DIAGNOSIS — F419 Anxiety disorder, unspecified: Secondary | ICD-10-CM | POA: Diagnosis not present

## 2017-07-02 DIAGNOSIS — R6882 Decreased libido: Secondary | ICD-10-CM

## 2017-07-02 DIAGNOSIS — N3281 Overactive bladder: Secondary | ICD-10-CM

## 2017-07-02 DIAGNOSIS — N3946 Mixed incontinence: Secondary | ICD-10-CM | POA: Diagnosis not present

## 2017-07-02 MED ORDER — BUSPIRONE HCL 10 MG PO TABS: 10.0000 mg | ORAL_TABLET | Freq: Three times a day (TID) | ORAL | 2 refills | Status: DC

## 2017-07-02 MED ORDER — SOLIFENACIN SUCCINATE 10 MG PO TABS: 10.0000 mg | ORAL_TABLET | Freq: Every day | ORAL | 3 refills | Status: DC

## 2017-07-02 NOTE — Progress Notes (Signed)
Subjective:    Patient ID: [name], female    DOB: Dec 10, 1951, 66 y.o.   MRN: [id number]  HPI  Pt presents to the clinic today with multiple complaints.  1- Dry Eyes. She reports this has been going on for months. She does not make enough tears, and she does not close her eyes all the way. She has been seeing [facility] for the same. 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 She has been undergoing multiple procedures by opthalmology. She is concerned that her Prozac and Wellbutrin are leading to dry eyes and she would like to stop them both. She reports she stopped them cold [place] in March, during her cervical fusion. She realized during that time that she felt more anxious than depression. She worries all the time about everything. She denies SI/HI.  2- She reports decreased sex drive. She reports she has not had an orgasm in 20+ years. She knows this is because of the Prozac and Wellbutrin but she wants to come off of it.   Review of Systems      Past Medical History:  Diagnosis Date  . Depression   . Thyroid disease     Current Outpatient Prescriptions  Medication Sig Dispense Refill  . acyclovir (ZOVIRAX) 200 MG capsule TAKE 1 CAPSULE BY MOUTH  EVERY DAY AS NEEDED 90 capsule 0  . buPROPion (WELLBUTRIN XL) 300 MG 24 hr tablet TAKE 1 TABLET BY MOUTH  DAILY 90 tablet 0  . dexamethasone (DECADRON) 2 MG tablet Take 2 mg by mouth 3 (three) times daily.  0  . DUREZOL 0.05 % EMUL Place 1 drop into both eyes 2 (two) times daily.  0  . FLUoxetine (PROZAC) 40 MG capsule TAKE 1 CAPSULE BY MOUTH  DAILY 90 capsule 0  . ibuprofen (ADVIL,MOTRIN) 200 MG tablet Take 200 mg by mouth every 6 (six) hours as needed for moderate pain.    Marland Kitchen levothyroxine (SYNTHROID, LEVOTHROID) 88 MCG tablet TAKE 1 TABLET BY MOUTH  DAILY BEFORE BREAKFAST 90 tablet 0  . solifenacin (VESICARE) 10 MG tablet Take 1 tablet (10 mg total) by mouth daily. 90 tablet 0  . tetracycline  (ACHROMYCIN,SUMYCIN) 250 MG capsule Take 250 mg by mouth once.     No current facility-administered medications for this visit.     Allergies  Allergen Reactions  . Codeine Hives  . Iodinated Diagnostic Agents Itching  . Metrizamide Itching    Family History  Problem Relation Age of Onset  . Cancer Mother        Uterine, Ovarian  . Heart disease Father   . Stroke Father   . Diabetes Father   . Cancer Maternal Grandmother   . Prostate cancer Neg Hx   . Kidney cancer Neg Hx   . Breast cancer Neg Hx     Social History   Social History  . Marital status: Married    Spouse name: N/A  . Number of children: N/A  . Years of education: N/A   Occupational History  . Not on file.   Social History Main Topics  . Smoking status: Never Smoker  . Smokeless tobacco: Never Used  . Alcohol use 0.0 oz/week     Comment: 2-3 nights week--1 glass of wine  . Drug use: No  . Sexual activity: Yes   Other Topics Concern  . Not on file   Social History Narrative  . No narrative on file     Constitutional: Denies fever,  malaise, fatigue, headache or abrupt weight changes.  HEENT: Pt reports dry eyes. Denies eye pain, eye redness, ear pain, ringing in the ears, wax buildup, runny nose, nasal congestion, bloody nose, or sore throat. GU: Pt reports decreased libido. Denies urgency, frequency, pain with urination, burning sensation, blood in urine, odor or discharge. Psych: Pt reports anxiety. Denies depression, SI/HI.  No other specific complaints in a complete review of systems (except as listed in HPI above).  Objective:   Physical Exam   BP 114/65   Pulse 76   Wt 152 lb 12.8 oz (69.3 kg)   BMI 23.58 kg/m  Wt Readings from Last 3 Encounters:  07/02/17 152 lb 12.8 oz (69.3 kg)  07/02/17 152 lb 6.4 oz (69.1 kg)  10/09/16 148 lb 4 oz (67.2 kg)    General: Appears her stated age, well developed, well nourished in NAD. HEENT: Head: normal shape and size; Eyes: sclera white,  no icterus, conjunctiva pink, PERRLA and EOMs intact;  Neurological: Alert and oriented. Resting head tremor noted. Psychiatric: Mood and affect normal. Behavior is normal. Judgment and thought content normal.    BMET    Component Value Date/Time   NA 137 03/08/2017 1245   NA 140 10/23/2016 1323   K 4.0 03/08/2017 1245   CL 103 03/08/2017 1245   CO2 24 03/08/2017 1245   GLUCOSE 115 (H) 03/08/2017 1245   BUN 15 03/08/2017 1245   BUN 17 10/23/2016 1323   CREATININE 0.87 03/08/2017 1245   CALCIUM 9.4 03/08/2017 1245   GFRNONAA >60 03/08/2017 1245   GFRAA >60 03/08/2017 1245    Lipid Panel     Component Value Date/Time   CHOL 264 (H) 10/09/2016 1110   TRIG 79 10/09/2016 1110   HDL 93 10/09/2016 1110   CHOLHDL 2.8 10/09/2016 1110   LDLCALC 155 (H) 10/09/2016 1110    CBC    Component Value Date/Time   WBC 14.1 (H) 03/08/2017 1245   RBC 5.09 03/08/2017 1245   HGB 15.8 (H) 03/08/2017 1245   HGB 13.2 10/23/2016 1323   HCT 46.9 (H) 03/08/2017 1245   HCT 40.6 10/23/2016 1323   PLT 227 03/08/2017 1245   PLT 218 10/23/2016 1323   MCV 92.1 03/08/2017 1245   MCV 92 10/23/2016 1323   MCH 31.0 03/08/2017 1245   MCHC 33.7 03/08/2017 1245   RDW 12.9 03/08/2017 1245   RDW 13.2 10/23/2016 1323    Hgb A1C No results found for: HGBA1C         Assessment & Plan:   Dry Eyes, Decrease Libido, Medication Side Effect:  Weaning regimen given for Wellbutrin and then Prozac She will continue to follow with opthalmology  Anxiety:  Will trial Buspar 10 mg BID, eRx sent to pharmacy  Follow up with me in 6 weeks via Armanda Heritage, NP

## 2017-07-02 NOTE — Patient Instructions (Signed)

## 2017-07-02 NOTE — Progress Notes (Signed)
07/02/2017 10:44 AM   [name] Oct 28, 1951 [id number]  Referring provider: Jearld Fenton, NP [address] [place], [place] [address]  Chief Complaint  Patient presents with  . 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 She can leak with coughing sneezing and sometimes with bending and lifting and laughing. She only wears a pad when she was out in public. She denies enuresis.  She is currently on oxybutynin and failed.  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 No stress incontinence or prolapse   The patient has mild stress incontinence but primarily is bothered by urgency and almost daily urge incontinence. The patient was dramatically better on Vesicare almost completely dry on her last visit. Subsequently it was increased to Vesicare 10 mg  Today Incontinence much better. Frequency is stable. She brakes to 10 mg tablet and have to cutter cost by 50%. She does not believe the medication makes her eye dry which is a chronic problem but wanted to make certain it is not aggravated    PMH: Past Medical History:  Diagnosis Date  . Depression   . Thyroid disease     Surgical History: Past Surgical History:  Procedure Laterality Date  . ABDOMINAL HYSTERECTOMY  1992   Total  . BREAST BIOPSY Right 1960's   negative  . BREAST BIOPSY Right 07/20/2015   neg  . CERVICAL FUSION  2005,2010   C2-C5  . REDUCTION MAMMAPLASTY Bilateral 2016  . TONSILLECTOMY  1961    Home Medications:  Allergies as of 07/02/2017      Reactions   Codeine Hives   Iodinated Diagnostic Agents Itching   Metrizamide Itching      Medication List       Accurate as of 07/02/17 10:44 AM. Always use your most recent med list.          acyclovir 200 MG capsule Commonly known as:  ZOVIRAX TAKE 1 CAPSULE BY MOUTH  EVERY DAY AS NEEDED   buPROPion 300 MG 24 hr tablet Commonly known as:  WELLBUTRIN  XL TAKE 1 TABLET BY MOUTH  DAILY   dexamethasone 2 MG tablet Commonly known as:  DECADRON Take 2 mg by mouth 3 (three) times daily.   DUREZOL 0.05 % Emul Generic drug:  Difluprednate Place 1 drop into both eyes 2 (two) times daily.   FLUoxetine 40 MG capsule Commonly known as:  PROZAC TAKE 1 CAPSULE BY MOUTH  DAILY   ibuprofen 200 MG tablet Commonly known as:  ADVIL,MOTRIN Take 200 mg by mouth every 6 (six) hours as needed for moderate pain.   levothyroxine 88 MCG tablet Commonly known as:  SYNTHROID, LEVOTHROID TAKE 1 TABLET BY MOUTH  DAILY BEFORE BREAKFAST   solifenacin 10 MG tablet Commonly known as:  VESICARE Take 1 tablet (10 mg total) by mouth daily.   tetracycline 250 MG capsule Commonly known as:  ACHROMYCIN,SUMYCIN Take 250 mg by mouth once.       Allergies:  Allergies  Allergen Reactions  . Codeine Hives  . Iodinated Diagnostic Agents Itching  . Metrizamide Itching    Family History: Family History  Problem Relation Age of Onset  . Cancer Mother        Uterine, Ovarian  . Heart disease Father   . Stroke Father   . Diabetes Father   . Cancer Maternal Grandmother   . Prostate cancer Neg Hx   .  Kidney cancer Neg Hx   . Breast cancer Neg Hx     Social History:  reports that she has never smoked. She has never used smokeless tobacco. She reports that she drinks alcohol. She reports that she does not use drugs.  ROS: UROLOGY Frequent Urination?: No Hard to postpone urination?: No Burning/pain with urination?: No Get up at night to urinate?: Yes Leakage of urine?: No Urine stream starts and stops?: No Trouble starting stream?: No Do you have to strain to urinate?: No Blood in urine?: No Urinary tract infection?: No Sexually transmitted disease?: No Injury to kidneys or bladder?: No Painful intercourse?: No Weak stream?: No Currently pregnant?: No Vaginal bleeding?: No Last menstrual period?: n  Gastrointestinal Nausea?: No Vomiting?:  No Indigestion/heartburn?: No Diarrhea?: No Constipation?: No  Constitutional Fever: No Night sweats?: No Weight loss?: No Fatigue?: No  Skin Skin rash/lesions?: No Itching?: No  Eyes Blurred vision?: Yes Double vision?: No  Ears/Nose/Throat Sore throat?: No Sinus problems?: No  Hematologic/Lymphatic Swollen glands?: No Easy bruising?: No  Cardiovascular Leg swelling?: No Chest pain?: No  Respiratory Cough?: No Shortness of breath?: No  Endocrine Excessive thirst?: No  Musculoskeletal Back pain?: No Joint pain?: No  Neurological Headaches?: No Dizziness?: No  Psychologic Depression?: No Anxiety?: No  Physical Exam: BP 110/60 (BP Location: Left Arm, Patient Position: Sitting, Cuff Size: Normal)   Pulse 73   Ht 5' 7.5" (1.715 m)   Wt 152 lb 6.4 oz (69.1 kg)   BMI 23.52 kg/m    Laboratory Data: Lab Results  Component Value Date   WBC 14.1 (H) 03/08/2017   HGB 15.8 (H) 03/08/2017   HCT 46.9 (H) 03/08/2017   MCV 92.1 03/08/2017   PLT 227 03/08/2017    Lab Results  Component Value Date   CREATININE 0.87 03/08/2017    No results found for: PSA  No results found for: TESTOSTERONE  No results found for: HGBA1C  Urinalysis    Component Value Date/Time   COLORURINE AMBER (A) 03/08/2017 1537   APPEARANCEUR CLEAR 03/08/2017 1537   APPEARANCEUR Hazy (A) 06/26/2016 0934   LABSPEC 1.023 03/08/2017 1537   PHURINE 6.0 03/08/2017 1537   GLUCOSEU NEGATIVE 03/08/2017 1537   HGBUR NEGATIVE 03/08/2017 1537   BILIRUBINUR NEGATIVE 03/08/2017 1537   BILIRUBINUR Negative 06/26/2016 0934   KETONESUR 5 (A) 03/08/2017 1537   PROTEINUR 30 (A) 03/08/2017 1537   UROBILINOGEN  03/20/2016 1043     Comment:     Unable to read due to pt taking AZO   UROBILINOGEN 1.0 04/29/2010 0749   NITRITE NEGATIVE 03/08/2017 1537   LEUKOCYTESUR NEGATIVE 03/08/2017 1537   LEUKOCYTESUR Trace (A) 06/26/2016 0934    Pertinent Imaging: none  Assessment & Plan:  The  patient's prescription was renewed with 90 tablets and 3 refills. Rare relationship to dry I was discussed. I will see her in 1 year  There are no diagnoses linked to this encounter.  No Follow-up on file.  Reece Packer, MD  [facility] [address] [place], [place] [address] [phone]

## 2017-08-07 ENCOUNTER — Encounter: Payer: Self-pay | Admitting: Internal Medicine

## 2017-08-08 MED ORDER — MELOXICAM 7.5 MG PO TABS: 7.5000 mg | ORAL_TABLET | Freq: Every day | ORAL | 2 refills | Status: DC

## 2017-08-16 ENCOUNTER — Other Ambulatory Visit: Payer: Self-pay | Admitting: Internal Medicine

## 2017-08-16 DIAGNOSIS — Z1231 Encounter for screening mammogram for malignant neoplasm of breast: Secondary | ICD-10-CM

## 2017-08-24 ENCOUNTER — Other Ambulatory Visit: Payer: Self-pay

## 2017-08-24 MED ORDER — MELOXICAM 7.5 MG PO TABS
7.5000 mg | ORAL_TABLET | Freq: Every day | ORAL | 0 refills | Status: DC
Start: 2017-08-24 — End: 2017-10-12

## 2017-08-24 NOTE — Telephone Encounter (Signed)
Looks like Klonopin was last filled 04/2016... Please advise

## 2017-08-25 ENCOUNTER — Other Ambulatory Visit: Payer: Self-pay | Admitting: Internal Medicine

## 2017-08-26 ENCOUNTER — Other Ambulatory Visit: Payer: Self-pay | Admitting: Internal Medicine

## 2017-08-27 MED ORDER — CLONAZEPAM 0.5 MG PO TABS: 0.5000 mg | ORAL_TABLET | Freq: Every day | ORAL | 0 refills | Status: DC | PRN

## 2017-08-27 NOTE — Telephone Encounter (Signed)
Letter mailed to pt letting her know that her CPE will be due Nov 2018

## 2017-08-27 NOTE — Telephone Encounter (Signed)
Ok to phone in Clonazepam 

## 2017-08-28 ENCOUNTER — Encounter: Payer: Self-pay | Admitting: Internal Medicine

## 2017-08-28 MED ORDER — CLONAZEPAM 0.5 MG PO TABS: 0.5000 mg | ORAL_TABLET | Freq: Every day | ORAL | 0 refills | Status: DC | PRN

## 2017-08-28 NOTE — Telephone Encounter (Signed)
Rx faxed to mail order 

## 2017-08-28 NOTE — Telephone Encounter (Signed)
RX printed and signed, placed in MYD box, no refills

## 2017-08-28 NOTE — Addendum Note (Signed)
Addended by: Lurlean Nanny on: 08/28/2017 09:01 AM   Modules accepted: Orders

## 2017-08-28 NOTE — Telephone Encounter (Signed)
Pt wants Rx faxed to mail order, did you want to add a refill?

## 2017-09-01 ENCOUNTER — Encounter: Payer: Self-pay | Admitting: Internal Medicine

## 2017-09-04 ENCOUNTER — Other Ambulatory Visit: Payer: Self-pay | Admitting: Internal Medicine

## 2017-09-04 ENCOUNTER — Encounter: Payer: Self-pay | Admitting: Internal Medicine

## 2017-09-07 ENCOUNTER — Encounter: Payer: Self-pay | Admitting: Internal Medicine

## 2017-09-12 ENCOUNTER — Other Ambulatory Visit: Payer: Self-pay

## 2017-09-12 MED ORDER — CLONAZEPAM 0.5 MG PO TABS: 0.5000 mg | ORAL_TABLET | Freq: Every day | ORAL | 0 refills | Status: DC | PRN

## 2017-09-12 NOTE — Telephone Encounter (Signed)
RX printed and signed and placed in MYD box 

## 2017-09-12 NOTE — Telephone Encounter (Signed)
[facility] left v/m requesting refill clonazepam. Last refilled # 30 on 08/28/17. Pt last annual 10/09/16 and CPX scheduled on 10/16/17.Please advise.

## 2017-09-13 NOTE — Telephone Encounter (Signed)
Rx faxed to [facility].  

## 2017-09-19 ENCOUNTER — Encounter: Payer: Self-pay | Admitting: Internal Medicine

## 2017-09-21 ENCOUNTER — Other Ambulatory Visit: Payer: Self-pay | Admitting: *Deleted

## 2017-09-21 NOTE — Telephone Encounter (Signed)
Faxed refill request.  Buspirone Last office visit:   09/02/17 Last Filled:    60 tablet 1 08/27/2017  Patient sig is TID but quantity is #60. Please advise.

## 2017-09-24 MED ORDER — BUSPIRONE HCL 10 MG PO TABS: 10.0000 mg | ORAL_TABLET | Freq: Three times a day (TID) | ORAL | 1 refills | Status: DC

## 2017-09-27 ENCOUNTER — Ambulatory Visit: Payer: Self-pay | Admitting: Internal Medicine

## 2017-09-27 DIAGNOSIS — Z0289 Encounter for other administrative examinations: Secondary | ICD-10-CM

## 2017-10-02 ENCOUNTER — Ambulatory Visit (INDEPENDENT_AMBULATORY_CARE_PROVIDER_SITE_OTHER): Payer: Managed Care, Other (non HMO) | Admitting: Internal Medicine

## 2017-10-02 VITALS — BP 114/68 | HR 71 | Temp 97.9°F | Wt 157.0 lb

## 2017-10-02 DIAGNOSIS — M25541 Pain in joints of right hand: Secondary | ICD-10-CM | POA: Diagnosis not present

## 2017-10-02 DIAGNOSIS — M25542 Pain in joints of left hand: Secondary | ICD-10-CM | POA: Diagnosis not present

## 2017-10-02 DIAGNOSIS — E039 Hypothyroidism, unspecified: Secondary | ICD-10-CM | POA: Diagnosis not present

## 2017-10-02 NOTE — Progress Notes (Signed)
Subjective:    Patient ID: [name], female    DOB: 1951/01/04, 66 y.o.   MRN: [id number]  HPI  Pt presents to the clinic today wit hc/o joint pain, mainly in her hands. She has noticed this over the last few months. She describes the pain and achy and stiff. She denies joint swelling or redness. She went to hand specialist, who took xrays of her hands and old her she had osteoarthritis not rheumatoid arthritis. She was prescribed Meloxicam and takes it occasionally, but has not noticed that it has helped. She is requesting a referral for rheumatology for evaluation of her joint pain.  She would like her thyroid levels checked today. She denies any issues on her current dose of Synthroid.   Review of Systems      Past Medical History:  Diagnosis Date  . Depression   . Thyroid disease     Current Outpatient Prescriptions  Medication Sig Dispense Refill  . acyclovir (ZOVIRAX) 200 MG capsule TAKE 1 CAPSULE BY MOUTH  EVERY DAY AS NEEDED 90 capsule 0  . busPIRone (BUSPAR) 10 MG tablet Take 1 tablet (10 mg total) by mouth 3 (three) times daily. 90 tablet 1  . clonazePAM (KLONOPIN) 0.5 MG tablet Take 1 tablet (0.5 mg total) by mouth daily as needed for anxiety. 30 tablet 0  . DUREZOL 0.05 % EMUL Place 1 drop into both eyes 2 (two) times daily.  0  . ibuprofen (ADVIL,MOTRIN) 200 MG tablet Take 200 mg by mouth every 6 (six) hours as needed for moderate pain.    Marland Kitchen levothyroxine (SYNTHROID, LEVOTHROID) 88 MCG tablet TAKE 1 TABLET BY MOUTH  DAILY BEFORE BREAKFAST 90 tablet 0  . meloxicam (MOBIC) 7.5 MG tablet Take 1 tablet (7.5 mg total) by mouth daily. 90 tablet 0  . solifenacin (VESICARE) 10 MG tablet Take 1 tablet (10 mg total) by mouth daily. 90 tablet 3  . tetracycline (ACHROMYCIN,SUMYCIN) 250 MG capsule Take 250 mg by mouth once.    . doxycycline (MONODOX) 50 MG capsule TAKE 1 CAPSULE (50 MG TOTAL) BY MOUTH ONCE DAILY.  2  . gabapentin (NEURONTIN) 300 MG capsule Take 300 mg by mouth  2 (two) times daily.      No current facility-administered medications for this visit.     Allergies  Allergen Reactions  . Codeine Hives  . Iodinated Diagnostic Agents Itching  . Metrizamide Itching    Family History  Problem Relation Age of Onset  . Cancer Mother        Uterine, Ovarian  . Heart disease Father   . Stroke Father   . Diabetes Father   . Cancer Maternal Grandmother   . Prostate cancer Neg Hx   . Kidney cancer Neg Hx   . Breast cancer Neg Hx     Social History   Social History  . Marital status: Married    Spouse name: N/A  . Number of children: N/A  . Years of education: N/A   Occupational History  . Not on file.   Social History Main Topics  . Smoking status: Never Smoker  . Smokeless tobacco: Never Used  . Alcohol use 0.0 oz/week     Comment: 2-3 nights week--1 glass of wine  . Drug use: No  . Sexual activity: Yes   Other Topics Concern  . Not on file   Social History Narrative  . No narrative on file     Constitutional: Denies fever, malaise, fatigue, headache or abrupt  weight changes.  Musculoskeletal: Pt reports joint pain. Denies difficulty with gait, muscle pain or joint swelling.    No other specific complaints in a complete review of systems (except as listed in HPI above).  Objective:   Physical Exam   BP 114/68   Pulse 71   Temp 97.9 F (36.6 C) (Oral)   Wt 157 lb (71.2 kg)   SpO2 98%   BMI 24.23 kg/m  Wt Readings from Last 3 Encounters:  10/02/17 157 lb (71.2 kg)  07/02/17 152 lb 12.8 oz (69.3 kg)  07/02/17 152 lb 6.4 oz (69.1 kg)    General: Appears her stated age, in NAD. Musculoskeletal: Normal flexion and extension of the fingers. No joint swelling noted. Joint deformity noted of the PIP, right pinky finger. Hand grips equal.  Neurological: Alert and oriented.   BMET    Component Value Date/Time   NA 137 03/08/2017 1245   NA 140 10/23/2016 1323   K 4.0 03/08/2017 1245   CL 103 03/08/2017 1245   CO2  24 03/08/2017 1245   GLUCOSE 115 (H) 03/08/2017 1245   BUN 15 03/08/2017 1245   BUN 17 10/23/2016 1323   CREATININE 0.87 03/08/2017 1245   CALCIUM 9.4 03/08/2017 1245   GFRNONAA >60 03/08/2017 1245   GFRAA >60 03/08/2017 1245    Lipid Panel     Component Value Date/Time   CHOL 264 (H) 10/09/2016 1110   TRIG 79 10/09/2016 1110   HDL 93 10/09/2016 1110   CHOLHDL 2.8 10/09/2016 1110   LDLCALC 155 (H) 10/09/2016 1110    CBC    Component Value Date/Time   WBC 14.1 (H) 03/08/2017 1245   RBC 5.09 03/08/2017 1245   HGB 15.8 (H) 03/08/2017 1245   HGB 13.2 10/23/2016 1323   HCT 46.9 (H) 03/08/2017 1245   HCT 40.6 10/23/2016 1323   PLT 227 03/08/2017 1245   PLT 218 10/23/2016 1323   MCV 92.1 03/08/2017 1245   MCV 92 10/23/2016 1323   MCH 31.0 03/08/2017 1245   MCHC 33.7 03/08/2017 1245   RDW 12.9 03/08/2017 1245   RDW 13.2 10/23/2016 1323    Hgb A1C No results found for: HGBA1C         Assessment & Plan:   Joint Pain in Hands:  Will check ESR, CRP, RF and ANA If labs abnormal, will refer to cardiology for further evaluation  Hypothyroidism:  Will check TSH and Free T4 today Will adjust and refill Synthroid based on labs  Will follow up after labs are back , return precautions discussed Webb Silversmith, NP

## 2017-10-03 ENCOUNTER — Ambulatory Visit
Admission: RE | Admit: 2017-10-03 | Discharge: 2017-10-03 | Disposition: A | Discharge status: Home / Self Care | Payer: Managed Care, Other (non HMO) | Source: Ambulatory Visit | Attending: Internal Medicine | Admitting: Internal Medicine

## 2017-10-03 ENCOUNTER — Encounter: Payer: Self-pay | Admitting: Internal Medicine

## 2017-10-03 ENCOUNTER — Other Ambulatory Visit: Payer: Self-pay

## 2017-10-03 DIAGNOSIS — Z1231 Encounter for screening mammogram for malignant neoplasm of breast: Secondary | ICD-10-CM | POA: Diagnosis present

## 2017-10-03 LAB — HIGH SENSITIVITY CRP: CRP HIGH SENSITIVITY: 3.77 mg/L — AB (ref 0.00–3.00)

## 2017-10-03 LAB — ANA: Anti Nuclear Antibody(ANA): NEGATIVE

## 2017-10-03 LAB — TSH: TSH: 0.708 u[IU]/mL (ref 0.450–4.500)

## 2017-10-03 LAB — T4, FREE: FREE T4: 1.03 ng/dL (ref 0.82–1.77)

## 2017-10-03 LAB — RHEUMATOID FACTOR: Rhuematoid fact SerPl-aCnc: <10 IU/mL (ref 0.0–13.9)

## 2017-10-03 LAB — SEDIMENTATION RATE: SED RATE: 2 mm/h (ref 0–40)

## 2017-10-03 MED ORDER — BUSPIRONE HCL 10 MG PO TABS: 10.0000 mg | ORAL_TABLET | Freq: Three times a day (TID) | ORAL | 1 refills | Status: DC

## 2017-10-03 NOTE — Patient Instructions (Signed)
Joint Pain Joint pain can be caused by many things. The joint can be bruised, infected, weak from aging, or sore from exercise. The pain will probably go away if you follow your doctor's instructions for home care. If your joint pain continues, more tests may be needed to help find the cause of your condition. Follow these instructions at home: Watch your condition for any changes. Follow these instructions as told to lessen the pain that you are feeling:  Take medicines only as told by your doctor.  Rest the sore joint for as long as told by your doctor. If your doctor tells you to, raise (elevate) the painful joint above the level of your heart while you are sitting or lying down.  Do not do things that cause pain or make the pain worse.  If told, put ice on the painful area: ? Put ice in a plastic bag. ? Place a towel between your skin and the bag. ? Leave the ice on for 20 minutes, 2-3 times per day.  Wear an elastic bandage, splint, or sling as told by your doctor. Loosen the bandage or splint if your fingers or toes lose feeling (become numb) and tingle, or if they turn cold and blue.  Begin exercising or stretching the joint as told by your doctor. Ask your doctor what types of exercise are safe for you.  Keep all follow-up visits as told by your doctor. This is important.  Contact a doctor if:  Your pain gets worse and medicine does not help it.  Your joint pain does not get better in 3 days.  You have more bruising or swelling.  You have a fever.  You lose 10 pounds (4.5 kg) or more without trying. Get help right away if:  You are not able to move the joint.  Your fingers or toes become numb or they turn cold and blue. This information is not intended to replace advice given to you by your health care provider. Make sure you discuss any questions you have with your health care provider. Document Released: 11/15/2009 Document Revised: 05/04/2016 Document Reviewed:  09/08/2014 Elsevier Interactive Patient Education  2018 [employer] Inc[employer]  

## 2017-10-06 ENCOUNTER — Encounter: Payer: Self-pay | Admitting: Internal Medicine

## 2017-10-08 ENCOUNTER — Encounter: Payer: Self-pay | Admitting: Internal Medicine

## 2017-10-09 MED ORDER — LEVOTHYROXINE SODIUM 112 MCG PO TABS: 112.0000 ug | ORAL_TABLET | Freq: Every day | ORAL | 1 refills | Status: DC

## 2017-10-12 ENCOUNTER — Other Ambulatory Visit: Payer: Self-pay | Admitting: Internal Medicine

## 2017-10-13 ENCOUNTER — Other Ambulatory Visit: Payer: Self-pay | Admitting: Internal Medicine

## 2017-10-15 ENCOUNTER — Other Ambulatory Visit: Payer: Self-pay | Admitting: Internal Medicine

## 2017-10-15 NOTE — Telephone Encounter (Signed)
Please advise if pt is supposed to continue meds.Marland KitchenMarland Kitchen

## 2017-10-16 ENCOUNTER — Encounter: Payer: Managed Care, Other (non HMO) | Admitting: Internal Medicine

## 2017-10-16 NOTE — Telephone Encounter (Signed)
She should be taking Buspar, Not Wellbutrin and Prozac.

## 2017-10-21 ENCOUNTER — Encounter: Payer: Self-pay | Admitting: Internal Medicine

## 2017-10-22 MED ORDER — MELOXICAM 15 MG PO TABS: 15.0000 mg | ORAL_TABLET | Freq: Every day | ORAL | 5 refills | Status: DC

## 2017-10-26 ENCOUNTER — Other Ambulatory Visit: Payer: Self-pay | Admitting: Internal Medicine

## 2017-10-30 ENCOUNTER — Other Ambulatory Visit: Payer: Self-pay

## 2017-10-30 MED ORDER — CLONAZEPAM 0.5 MG PO TABS: 0.5000 mg | ORAL_TABLET | Freq: Every day | ORAL | 0 refills | Status: DC | PRN

## 2017-10-30 NOTE — Telephone Encounter (Signed)
No, she has an appt with you 11/20/17

## 2017-10-30 NOTE — Telephone Encounter (Signed)
Last filled 09/12/2017--please advise to fax Rx to mail order

## 2017-10-30 NOTE — Telephone Encounter (Signed)
RX printed and signed and placed in MYD box. Is there a CSA and UDS on file?

## 2017-10-30 NOTE — Telephone Encounter (Signed)
Rx faxed

## 2017-10-31 ENCOUNTER — Other Ambulatory Visit: Payer: Self-pay | Admitting: Internal Medicine

## 2017-10-31 ENCOUNTER — Telehealth: Payer: Self-pay | Admitting: Internal Medicine

## 2017-10-31 MED ORDER — CLONAZEPAM 0.5 MG PO TABS: 0.5000 mg | ORAL_TABLET | Freq: Every day | ORAL | 0 refills | Status: DC | PRN

## 2017-10-31 NOTE — Telephone Encounter (Signed)
Copied from [place]. Topic: Inquiry >> Oct 31, 2017  2:09 PM Pricilla Handler wrote: Reason for CRM: Patient called stating that she needs her refill of clonazePAM (KLONOPIN) 0.5 MG tablet ASAP. Patient stated that [facility] has faxed the refill request to [facility] 3 times with no response. Patient states that she now only has two pills left. Patient wants Threasa Beards to call her back today before 3pm. Patient states this is very URGENT. Patient is Upset at this time. Please have Melanie to call this patient ASAP.

## 2017-10-31 NOTE — Telephone Encounter (Signed)
Medication needs provider approval. Thanks. 

## 2017-10-31 NOTE — Telephone Encounter (Signed)
I spoke to Maxwell with [facility] and she states the pharmacist has cancelled the Rx to allow the Rx to be called into the local pharmacy  Per verbal order from [place] okay to call in Rx to local pharmacy once it has been confirmed Rx to mail order had been cancelled  Rx called into pharmacy

## 2017-10-31 NOTE — Telephone Encounter (Signed)
This refill request was received yesterday and the Rx was faxed to mail order, pt is aware

## 2017-10-31 NOTE — Addendum Note (Signed)
Addended by: Lurlean Nanny on: 10/31/2017 05:20 PM   Modules accepted: Orders

## 2017-11-12 ENCOUNTER — Ambulatory Visit: Payer: Self-pay | Admitting: Psychiatry

## 2017-11-20 ENCOUNTER — Encounter: Payer: Managed Care, Other (non HMO) | Admitting: Internal Medicine

## 2017-11-20 ENCOUNTER — Other Ambulatory Visit: Payer: Self-pay | Admitting: Internal Medicine

## 2017-11-20 ENCOUNTER — Other Ambulatory Visit: Payer: Managed Care, Other (non HMO)

## 2017-11-20 DIAGNOSIS — E039 Hypothyroidism, unspecified: Secondary | ICD-10-CM

## 2017-11-21 ENCOUNTER — Encounter: Payer: Self-pay | Admitting: Psychiatry

## 2017-11-21 ENCOUNTER — Other Ambulatory Visit: Payer: Self-pay

## 2017-11-21 ENCOUNTER — Ambulatory Visit (INDEPENDENT_AMBULATORY_CARE_PROVIDER_SITE_OTHER): Payer: Managed Care, Other (non HMO) | Admitting: Psychiatry

## 2017-11-21 ENCOUNTER — Encounter: Payer: Self-pay | Admitting: Internal Medicine

## 2017-11-21 VITALS — BP 132/76 | HR 69 | Temp 98.8°F | Wt 160.4 lb

## 2017-11-21 DIAGNOSIS — F4312 Post-traumatic stress disorder, chronic: Secondary | ICD-10-CM | POA: Diagnosis not present

## 2017-11-21 DIAGNOSIS — F411 Generalized anxiety disorder: Secondary | ICD-10-CM

## 2017-11-21 DIAGNOSIS — E039 Hypothyroidism, unspecified: Secondary | ICD-10-CM

## 2017-11-21 MED ORDER — MIRTAZAPINE 7.5 MG PO TABS: 7.5000 mg | ORAL_TABLET | Freq: Every day | ORAL | 1 refills | Status: DC

## 2017-11-21 MED ORDER — PROPRANOLOL HCL 10 MG PO TABS: 10.0000 mg | ORAL_TABLET | Freq: Three times a day (TID) | ORAL | 1 refills | Status: DC | PRN

## 2017-11-21 MED ORDER — GABAPENTIN 300 MG PO CAPS: 300.0000 mg | ORAL_CAPSULE | Freq: Three times a day (TID) | ORAL | 2 refills | Status: DC
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
## 2017-11-21 NOTE — Addendum Note (Signed)
Addended by: Ellamae Sia on: 11/21/2017 04:15 PM   Modules accepted: Orders

## 2017-11-21 NOTE — Patient Instructions (Signed)
Gabapentin capsules or tablets What is this medicine? GABAPENTIN (GA ba pen tin) is used to control partial seizures in adults with epilepsy. It is also used to treat certain types of nerve pain. This medicine may be used for other purposes; ask your health care provider or pharmacist if you have questions. COMMON BRAND NAME(S): Active-PAC with Gabapentin, Gabarone, Neurontin What should I tell my health care provider before I take this medicine? They need to know if you have any of these conditions: -kidney disease -suicidal thoughts, plans, or attempt; a previous suicide attempt by you or a family member -an unusual or allergic reaction to gabapentin, other medicines, foods, dyes, or preservatives -pregnant or trying to get pregnant -breast-feeding How should I use this medicine? Take this medicine by mouth with a glass of water. Follow the directions on the prescription label. You can take it with or without food. If it upsets your stomach, take it with food.Take your medicine at regular intervals. Do not take it more often than directed. Do not stop taking except on your doctor's advice. If you are directed to break the 600 or 800 mg tablets in half as part of your dose, the extra half tablet should be used for the next dose. If you have not used the extra half tablet within 28 days, it should be thrown away. A special MedGuide will be given to you by the pharmacist with each prescription and refill. Be sure to read this information carefully each time. Talk to your pediatrician regarding the use of this medicine in children. Special care may be needed. Overdosage: If you think you have taken too much of this medicine contact a poison control center or emergency room at once. NOTE: This medicine is only for you. Do not share this medicine with others. What if I miss a dose? If you miss a dose, take it as soon as you can. If it is almost time for your next dose, take only that dose. Do not  take double or extra doses. What may interact with this medicine? Do not take this medicine with any of the following medications: -other gabapentin products This medicine may also interact with the following medications: -alcohol -antacids -antihistamines for allergy, cough and cold -certain medicines for anxiety or sleep -certain medicines for depression or psychotic disturbances -homatropine; hydrocodone -naproxen -narcotic medicines (opiates) for pain -phenothiazines like chlorpromazine, mesoridazine, prochlorperazine, thioridazine This list may not describe all possible interactions. Give your health care provider a list of all the medicines, herbs, non-prescription drugs, or dietary supplements you use. Also tell them if you smoke, drink alcohol, or use illegal drugs. Some items may interact with your medicine. What should I watch for while using this medicine? Visit your doctor or health care professional for regular checks on your progress. You may want to keep a record at home of how you feel your condition is responding to treatment. You may want to share this information with your doctor or health care professional at each visit. You should contact your doctor or health care professional if your seizures get worse or if you have any new types of seizures. Do not stop taking this medicine or any of your seizure medicines unless instructed by your doctor or health care professional. Stopping your medicine suddenly can increase your seizures or their severity. Wear a medical identification bracelet or chain if you are taking this medicine for seizures, and carry a card that lists all your medications. You may get drowsy, dizzy,   or have blurred vision. Do not drive, use machinery, or do anything that needs mental alertness until you know how this medicine affects you. To reduce dizzy or fainting spells, do not sit or stand up quickly, especially if you are an older patient. Alcohol can  increase drowsiness and dizziness. Avoid alcoholic drinks. Your mouth may get dry. Chewing sugarless gum or sucking hard candy, and drinking plenty of water will help. The use of this medicine may increase the chance of suicidal thoughts or actions. Pay special attention to how you are responding while on this medicine. Any worsening of mood, or thoughts of suicide or dying should be reported to your health care professional right away. Women who become pregnant while using this medicine may enroll in the [place] Pregnancy Registry by calling [id number]. This registry collects information about the safety of antiepileptic drug use during pregnancy. What side effects may I notice from receiving this medicine? Side effects that you should report to your doctor or health care professional as soon as possible: -allergic reactions like skin rash, itching or hives, swelling of the face, lips, or tongue -worsening of mood, thoughts or actions of suicide or dying Side effects that usually do not require medical attention (report to your doctor or health care professional if they continue or are bothersome): -constipation -difficulty walking or controlling muscle movements -dizziness -nausea -slurred speech -tiredness -tremors -weight gain This list may not describe all possible side effects. Call your doctor for medical advice about side effects. You may report side effects to FDA at 1-800-FDA-1088. Where should I keep my medicine? Keep out of reach of children. This medicine may cause accidental overdose and death if it taken by other adults, children, or pets. Mix any unused medicine with a substance like cat litter or coffee grounds. Then throw the medicine away in a sealed container like a sealed bag or a coffee can with a lid. Do not use the medicine after the expiration date. Store at room temperature between 15 and 30 degrees C (59 and 86 degrees F). NOTE: This  sheet is a summary. It may not cover all possible information. If you have questions about this medicine, talk to your doctor, pharmacist, or health care provider.  2018 [employer]/Gold Standard (2014-01-23 15:26:50) Mirtazapine tablets What is this medicine? MIRTAZAPINE (mir TAZ a peen) is used to treat depression. This medicine may be used for other purposes; ask your health care provider or pharmacist if you have questions. COMMON BRAND NAME(S): Remeron What should I tell my health care provider before I take this medicine? They need to know if you have any of these conditions: -bipolar disorder -glaucoma -kidney disease -liver disease -suicidal thoughts -an unusual or allergic reaction to mirtazapine, other medicines, foods, dyes, or preservatives -pregnant or trying to get pregnant -breast-feeding How should I use this medicine? Take this medicine by mouth with a glass of water. Follow the directions on the prescription label. Take your medicine at regular intervals. Do not take your medicine more often than directed. Do not stop taking this medicine suddenly except upon the advice of your doctor. Stopping this medicine too quickly may cause serious side effects or your condition may worsen. A special MedGuide will be given to you by the pharmacist with each prescription and refill. Be sure to read this information carefully each time. Talk to your pediatrician regarding the use of this medicine in children. Special care may be needed. Overdosage: If you think you have taken  too much of this medicine contact a poison control center or emergency room at once. NOTE: This medicine is only for you. Do not share this medicine with others. What if I miss a dose? If you miss a dose, take it as soon as you can. If it is almost time for your next dose, take only that dose. Do not take double or extra doses. What may interact with this medicine? Do not take this medicine with any of the following  medications: -linezolid -MAOIs like Carbex, Eldepryl, Marplan, Nardil, and Parnate -methylene blue (injected into a vein) This medicine may also interact with the following medications: -alcohol -antiviral medicines for HIV or AIDS -certain medicines that treat or prevent blood clots like warfarin -certain medicines for depression, anxiety, or psychotic disturbances -certain medicines for fungal infections like ketoconazole and itraconazole -certain medicines for migraine headache like almotriptan, eletriptan, frovatriptan, naratriptan, rizatriptan, sumatriptan, zolmitriptan -certain medicines for seizures like carbamazepine or phenytoin -certain medicines for sleep -cimetidine -erythromycin -fentanyl -lithium -medicines for blood pressure -nefazodone -rasagiline -rifampin -supplements like St. John's wort, kava kava, valerian -tramadol -tryptophan This list may not describe all possible interactions. Give your health care provider a list of all the medicines, herbs, non-prescription drugs, or dietary supplements you use. Also tell them if you smoke, drink alcohol, or use illegal drugs. Some items may interact with your medicine. What should I watch for while using this medicine? Tell your doctor if your symptoms do not get better or if they get worse. Visit your doctor or health care professional for regular checks on your progress. Because it may take several weeks to see the full effects of this medicine, it is important to continue your treatment as prescribed by your doctor. Patients and their families should watch out for new or worsening thoughts of suicide or depression. Also watch out for sudden changes in feelings such as feeling anxious, agitated, panicky, irritable, hostile, aggressive, impulsive, severely restless, overly excited and hyperactive, or not being able to sleep. If this happens, especially at the beginning of treatment or after a change in dose, call your health  care professional. Dennis Bast may get drowsy or dizzy. Do not drive, use machinery, or do anything that needs mental alertness until you know how this medicine affects you. Do not stand or sit up quickly, especially if you are an older patient. This reduces the risk of dizzy or fainting spells. Alcohol may interfere with the effect of this medicine. Avoid alcoholic drinks. This medicine may cause dry eyes and blurred vision. If you wear contact lenses you may feel some discomfort. Lubricating drops may help. See your eye doctor if the problem does not go away or is severe. Your mouth may get dry. Chewing sugarless gum or sucking hard candy, and drinking plenty of water may help. Contact your doctor if the problem does not go away or is severe. What side effects may I notice from receiving this medicine? Side effects that you should report to your doctor or health care professional as soon as possible: -allergic reactions like skin rash, itching or hives, swelling of the face, lips, or tongue -anxious -changes in vision -chest pain -confusion -elevated mood, decreased need for sleep, racing thoughts, impulsive behavior -eye pain -fast, irregular heartbeat -feeling faint or lightheaded, falls -feeling agitated, angry, or irritable -fever or chills, sore throat -hallucination, loss of contact with reality -loss of balance or coordination -mouth sores -redness, blistering, peeling or loosening of the skin, including inside the mouth -restlessness,  pacing, inability to keep still -seizures -stiff muscles -suicidal thoughts or other mood changes -trouble passing urine or change in the amount of urine -trouble sleeping -unusual bleeding or bruising -unusually weak or tired -vomiting Side effects that usually do not require medical attention (report to your doctor or health care professional if they continue or are bothersome): -change in appetite -constipation -dizziness -dry mouth -muscle aches  or pains -nausea -tired -weight gain This list may not describe all possible side effects. Call your doctor for medical advice about side effects. You may report side effects to FDA at 1-800-FDA-1088. Where should I keep my medicine? Keep out of the reach of children. Store at room temperature between 15 and 30 degrees C (59 and 86 degrees F) Protect from light and moisture. Throw away any unused medicine after the expiration date. NOTE: This sheet is a summary. It may not cover all possible information. If you have questions about this medicine, talk to your doctor, pharmacist, or health care provider.  2018 [employer]/Gold Standard (2016-04-27 17:30:45) Propranolol tablets What is this medicine? PROPRANOLOL (proe PRAN oh lole) is a beta-blocker. Beta-blockers reduce the workload on the heart and help it to beat more regularly. This medicine is used to treat high blood pressure, to control irregular heart rhythms (arrhythmias) and to relieve chest pain caused by angina. It may also be helpful after a heart attack. This medicine is also used to prevent migraine headaches, relieve uncontrollable shaking (tremors), and help certain problems related to the thyroid gland and adrenal gland. This medicine may be used for other purposes; ask your health care provider or pharmacist if you have questions. COMMON BRAND NAME(S): Inderal What should I tell my health care provider before I take this medicine? They need to know if you have any of these conditions: -circulation problems or blood vessel disease -diabetes -history of heart attack or heart disease, vasospastic angina -kidney disease -liver disease -lung or breathing disease, like asthma or emphysema -pheochromocytoma -slow heart rate -thyroid disease -an unusual or allergic reaction to propranolol, other beta-blockers, medicines, foods, dyes, or preservatives -pregnant or trying to get pregnant -breast-feeding How should I use this  medicine? Take this medicine by mouth with a glass of water. Follow the directions on the prescription label. Take your doses at regular intervals. Do not take your medicine more often than directed. Do not stop taking except on your the advice of your doctor or health care professional. Talk to your pediatrician regarding the use of this medicine in children. Special care may be needed. Overdosage: If you think you have taken too much of this medicine contact a poison control center or emergency room at once. NOTE: This medicine is only for you. Do not share this medicine with others. What if I miss a dose? If you miss a dose, take it as soon as you can. If it is almost time for your next dose, take only that dose. Do not take double or extra doses. What may interact with this medicine? Do not take this medicine with any of the following medications: -feverfew -phenothiazines like chlorpromazine, mesoridazine, prochlorperazine, thioridazine This medicine may also interact with the following medications: -aluminum hydroxide gel -antipyrine -antiviral medicines for HIV or AIDS -barbiturates like phenobarbital -certain medicines for blood pressure, heart disease, irregular heart beat -cimetidine -ciprofloxacin -diazepam -fluconazole -haloperidol -isoniazid -medicines for cholesterol like cholestyramine or colestipol -medicines for mental depression -medicines for migraine headache like almotriptan, eletriptan, frovatriptan, naratriptan, rizatriptan, sumatriptan, zolmitriptan -NSAIDs, medicines for pain  and inflammation, like ibuprofen or naproxen -phenytoin -rifampin -teniposide -theophylline -thyroid medicines -tolbutamide -warfarin -zileuton This list may not describe all possible interactions. Give your health care provider a list of all the medicines, herbs, non-prescription drugs, or dietary supplements you use. Also tell them if you smoke, drink alcohol, or use illegal drugs.  Some items may interact with your medicine. What should I watch for while using this medicine? Visit your doctor or health care professional for regular check ups. Check your blood pressure and pulse rate regularly. Ask your health care professional what your blood pressure and pulse rate should be, and when you should contact them. You may get drowsy or dizzy. Do not drive, use machinery, or do anything that needs mental alertness until you know how this drug affects you. Do not stand or sit up quickly, especially if you are an older patient. This reduces the risk of dizzy or fainting spells. Alcohol can make you more drowsy and dizzy. Avoid alcoholic drinks. This medicine can affect blood sugar levels. If you have diabetes, check with your doctor or health care professional before you change your diet or the dose of your diabetic medicine. Do not treat yourself for coughs, colds, or pain while you are taking this medicine without asking your doctor or health care professional for advice. Some ingredients may increase your blood pressure. What side effects may I notice from receiving this medicine? Side effects that you should report to your doctor or health care professional as soon as possible: -allergic reactions like skin rash, itching or hives, swelling of the face, lips, or tongue -breathing problems -changes in blood sugar -cold hands or feet -difficulty sleeping, nightmares -dry peeling skin -hallucinations -muscle cramps or weakness -slow heart rate -swelling of the legs and ankles -vomiting Side effects that usually do not require medical attention (report to your doctor or health care professional if they continue or are bothersome): -change in sex drive or performance -diarrhea -dry sore eyes -hair loss -nausea -weak or tired This list may not describe all possible side effects. Call your doctor for medical advice about side effects. You may report side effects to FDA at  1-800-FDA-1088. Where should I keep my medicine? Keep out of the reach of children. Store at room temperature between 15 and 30 degrees C (59 and 86 degrees F). Protect from light. Throw away any unused medicine after the expiration date. NOTE: This sheet is a summary. It may not cover all possible information. If you have questions about this medicine, talk to your doctor, pharmacist, or health care provider.  2018 [employer]vier/Gold Standard (2013-08-01 14:51:53)

## 2017-11-21 NOTE — Addendum Note (Signed)
Addended by: Jearld Fenton on: 11/21/2017 01:34 PM   Modules accepted: Orders

## 2017-11-23 ENCOUNTER — Encounter: Payer: Self-pay | Admitting: Internal Medicine

## 2017-11-23 LAB — T4, FREE: FREE T4: 1.24 ng/dL (ref 0.82–1.77)

## 2017-11-23 LAB — TSH: TSH: 0.214 u[IU]/mL — AB (ref 0.450–4.500)

## 2017-11-23 MED ORDER — LEVOTHYROXINE SODIUM 100 MCG PO TABS: 100.0000 ug | ORAL_TABLET | Freq: Every day | ORAL | 1 refills | Status: DC

## 2017-11-27 NOTE — Addendum Note (Signed)
Addended by: Lurlean Nanny on: 11/27/2017 11:38 AM   Modules accepted: Orders

## 2017-12-19 ENCOUNTER — Encounter: Payer: Self-pay | Admitting: Psychiatry

## 2017-12-19 ENCOUNTER — Ambulatory Visit: Payer: Managed Care, Other (non HMO) | Admitting: Psychiatry

## 2017-12-19 ENCOUNTER — Other Ambulatory Visit: Payer: Self-pay

## 2017-12-19 VITALS — BP 136/75 | HR 75 | Temp 98.1°F | Wt 160.8 lb

## 2017-12-19 DIAGNOSIS — F4312 Post-traumatic stress disorder, chronic: Secondary | ICD-10-CM | POA: Diagnosis not present

## 2017-12-19 DIAGNOSIS — F411 Generalized anxiety disorder: Secondary | ICD-10-CM

## 2017-12-19 MED ORDER — MIRTAZAPINE 7.5 MG PO TABS: 7.5000 mg | ORAL_TABLET | Freq: Every day | ORAL | 0 refills | Status: DC

## 2017-12-19 MED ORDER — GABAPENTIN 300 MG PO CAPS
300.0000 mg | ORAL_CAPSULE | Freq: Three times a day (TID) | ORAL | 0 refills | Status: DC
Start: 2017-12-19 — End: 2018-02-11

## 2017-12-19 MED ORDER — PROPRANOLOL HCL 10 MG PO TABS: 10.0000 mg | ORAL_TABLET | Freq: Three times a day (TID) | ORAL | 0 refills | Status: DC | PRN

## 2017-12-19 MED ORDER — BUSPIRONE HCL 10 MG PO TABS: 10.0000 mg | ORAL_TABLET | Freq: Three times a day (TID) | ORAL | 0 refills | Status: DC

## 2017-12-19 NOTE — Progress Notes (Signed)
[name] OP Progress Note  12/19/2017 12:50 PM [name]  MRN:  [id number]  Chief Complaint: ' I am ok.'  Chief Complaint    Follow-up; Medication Refill     HPI: [name] is a 67 year old Caucasian female, retired, married, lives in [place], has a history of anxiety and PTSD, presented to the clinic today for a follow-up visit.  [name] today reports that she had a good Thanksgiving, Christmas and New Year's Eve.  She reports that she spent time with her family, did a lot of cooking, attended her daughter's graduation in [place] , visited her father in [place] as well as father-in-law in [place].  She reports that she has been able to cope with her anxiety better than before.  She also reports that she was able to visit her father and did not have any problems of anxiety, dissociation and so on that she used to struggle with in the past.  She continues to have some nightmares on and off.  She reports the nightmares can vary and it may be about anything or anyone.  She reports she however has been sleeping better on the current combination of medication.  She would like to give it more time before she try a medication for her nightmares.  Discussed dream planning with patient.  She denies any side effects to her medications.  She has been compliant with it.  She looks forward to visiting her friend who is suffering from cancer this weekend.  She plans to stay with her and help her out.   Visit Diagnosis:    ICD-10-CM   1. GAD (generalized anxiety disorder) F41.1 gabapentin (NEURONTIN) 300 MG capsule    mirtazapine (REMERON) 7.5 MG tablet    propranolol (INDERAL) 10 MG tablet    busPIRone (BUSPAR) 10 MG tablet  2. Chronic post-traumatic stress disorder (PTSD) F43.12     Past Psychiatric History: She used to follow up with [facility] counseling for 15 years or so.  She has been tried on medications like Wellbutrin, Prozac in the past.  Reports a history of suicidality when her mother  passed away of cancer 18 years ago.  Past trials of Wellbutrin, Prozac, Klonopin.  Past Medical History:  Past Medical History:  Diagnosis Date  . Anxiety   . Depression   . Thyroid disease     Past Surgical History:  Procedure Laterality Date  . ABDOMINAL HYSTERECTOMY  1992   Total  . BREAST BIOPSY Right 1960's   negative  . BREAST BIOPSY Right 07/20/2015   neg  . CERVICAL FUSION  2005,2010   C2-C5  . REDUCTION MAMMAPLASTY Bilateral 2016  . TONSILLECTOMY  1961    Family Psychiatric History: Father-abused opioids, daughter-mental health issues even though she is very high functioning, mother-depression  Family History:  Family History  Problem Relation Age of Onset  . Cancer Mother        Uterine, Ovarian  . Anxiety disorder Mother   . Depression Mother   . Heart disease Father   . Stroke Father   . Diabetes Father   . Cancer Maternal Grandmother   . ADD / ADHD Daughter   . ADD / ADHD Grandchild   . Prostate cancer Neg Hx   . Kidney cancer Neg Hx   . Breast cancer Neg Hx    Substance abuse history : Denies   Social History: Married, retired.  Used to work with [employer] health in the past.  She currently lives in [place] with her husband.  She has 2 children, a son and a daughter. Social History   Socioeconomic History  . Marital status: Married    Spouse name: [name]  . Number of children: 2  . Years of education: None  . Highest education level: Master's degree (e.g., MA, MS, MEng, MEd, MSW, MBA)  Social Needs  . Financial resource strain: Not hard at all  . Food insecurity - worry: Never true  . Food insecurity - inability: Never true  . Transportation needs - medical: No  . Transportation needs - non-medical: No  Occupational History    Comment: retired  Tobacco Use  . Smoking status: Never Smoker  . Smokeless tobacco: Never Used  Substance and Sexual Activity  . Alcohol use: Yes    Alcohol/week: 0.6 oz    Types: 1 Glasses of wine per week     Comment: 2-3 nights week--1 glass of wine  . Drug use: No  . Sexual activity: Yes    Birth control/protection: None  Other Topics Concern  . None  Social History Narrative  . None    Allergies:  Allergies  Allergen Reactions  . Codeine Hives  . Iodinated Diagnostic Agents Itching  . Metrizamide Itching    Metabolic Disorder Labs: No results found for: HGBA1C, MPG No results found for: PROLACTIN Lab Results  Component Value Date   CHOL 264 (H) 10/09/2016   TRIG 79 10/09/2016   HDL 93 10/09/2016   CHOLHDL 2.8 10/09/2016   LDLCALC 155 (H) 10/09/2016   Lab Results  Component Value Date   TSH 0.214 (L) 11/22/2017   TSH 0.708 10/02/2017    Therapeutic Level Labs: No results found for: LITHIUM No results found for: VALPROATE No components found for:  CBMZ  Current Medications: Current Outpatient Medications  Medication Sig Dispense Refill  . acyclovir (ZOVIRAX) 200 MG capsule TAKE 1 CAPSULE BY MOUTH  EVERY DAY AS NEEDED 90 capsule 0  . AUTOLOGOUS CULTURE CHONDROCYTE IX Apply to eye.    . busPIRone (BUSPAR) 10 MG tablet Take 1 tablet (10 mg total) by mouth 3 (three) times daily. 270 tablet 0  . clonazePAM (KLONOPIN) 0.5 MG tablet Take 1 tablet (0.5 mg total) by mouth daily as needed for anxiety. 30 tablet 0  . doxycycline (MONODOX) 50 MG capsule TAKE 1 CAPSULE (50 MG TOTAL) BY MOUTH ONCE DAILY.  2  . DUREZOL 0.05 % EMUL Place 1 drop into both eyes 2 (two) times daily.  0  . gabapentin (NEURONTIN) 300 MG capsule Take 1 capsule (300 mg total) by mouth 3 (three) times daily. 270 capsule 0  . ibuprofen (ADVIL,MOTRIN) 200 MG tablet Take 200 mg by mouth every 6 (six) hours as needed for moderate pain.    Marland Kitchen levothyroxine (SYNTHROID, LEVOTHROID) 100 MCG tablet Take 1 tablet (100 mcg total) by mouth daily. 30 tablet 1  . meloxicam (MOBIC) 15 MG tablet Take 1 tablet (15 mg total) daily by mouth. 30 tablet 5  . mirtazapine (REMERON) 7.5 MG tablet Take 1 tablet (7.5 mg total) by  mouth at bedtime. 90 tablet 0  . Omega-3 Fatty Acids (FISH OIL) 1000 MG CAPS Take by mouth.    Vladimir Faster Glyc-Propyl Glyc PF (SYSTANE PRESERVATIVE FREE) 0.4-0.3 % SOLN Apply to eye.    . propranolol (INDERAL) 10 MG tablet Take 1 tablet (10 mg total) by mouth 3 (three) times daily as needed. 180 tablet 0  . solifenacin (VESICARE) 10 MG tablet Take 1 tablet (10 mg total) by mouth daily. [place]  tablet 3  . White Petrolatum-Mineral Oil (GENTEAL TEARS NIGHT-TIME) OINT Apply to eye.     No current facility-administered medications for this visit.      Musculoskeletal: Strength & Muscle Tone: within normal limits Gait & Station: normal Patient leans: N/A  Psychiatric Specialty Exam: Review of Systems  Psychiatric/Behavioral: Negative for depression, hallucinations, substance abuse and suicidal ideas. The patient is not nervous/anxious and does not have insomnia.   All other systems reviewed and are negative.   Blood pressure 136/75, pulse 75, temperature 98.1 F (36.7 C), temperature source Oral, weight 160 lb 12.8 oz (72.9 kg).Body mass index is 24.81 kg/m.  General Appearance: Casual  Eye Contact:  Fair  Speech:  Clear and Coherent  Volume:  Normal  Mood:  Euthymic  Affect:  Congruent  Thought Process:  Goal Directed and Descriptions of Associations: Intact  Orientation:  Full (Time, Place, and Person)  Thought Content: Logical   Suicidal Thoughts:  No  Homicidal Thoughts:  No  Memory:  Immediate;   Fair Recent;   Fair Remote;   Fair  Judgement:  Fair  Insight:  Fair  Psychomotor Activity:  Normal  Concentration:  Concentration: Fair and Attention Span: Fair  Recall:  [employer] of Knowledge: Fair  Language: Fair  Akathisia:  No  Handed:  Right  AIMS (if indicated): NA  Assets:  Communication Skills Desire for Improvement Financial Resources/Insurance Housing Intimacy Leisure Time [place] Talents/Skills Transportation Vocational/Educational  ADL's:  Intact  Cognition: WNL  Sleep:  Fair   Screenings: PHQ2-9     Office Visit from 10/09/2016 in [place] at [facility] Visit from 04/18/2016 in [place] at [facility] Visit from 07/30/2014 in [place] at [facility]  PHQ-2 Total Score  0  0  0       Assessment and Plan: [name] is a 67 year old Caucasian female who is married, retired, lives in [place] has a history of PTSD as well as anxiety disorder, thyroid problems, eye problem who presented to the clinic today for a follow-up visit.  [name] has an extensive history of being in psychotherapy/family therapy and medication management since the past 15 years or so.  She recently transitioned her care from [facility] to [facility] and this is her  second appointment with Probation officer.  [name] reports she is doing well on her current combination of medication.  Plan as noted below. Plan  For GAD Gabapentin 300 mg p.o. 3 times daily. Continue BuSpar 10 mg p.o. 3 times daily Continue Remeron 7.5 mg p.o. nightly She reports she stopped taking the Klonopin. Continue propranolol 10 mg p.o. 3 times daily as needed for anxiety sx.  For PTSD Remeron 7.5 mg p.o. nightly. Continue psychotherapy  For insomnia Remeron 7.5 mg p.o. nightly Discussed treatment planning for nightmares.  Provided supportive psychotherapy for 10 minutes.  Follow-up in 2 months or sooner if needed.   More than 50 % of the time was spent for psychoeducation and supportive psychotherapy and care coordination.  This note was generated in part or whole with voice recognition software. Voice recognition is usually quite accurate but there are transcription errors that can and very often do occur. I apologize for any typographical errors that were not detected and corrected.           Elaine Alert, [name] 12/19/2017, 12:50 PM

## 2017-12-28 ENCOUNTER — Other Ambulatory Visit: Payer: Self-pay | Admitting: Internal Medicine

## 2018-01-11 ENCOUNTER — Telehealth: Payer: Self-pay | Admitting: Internal Medicine

## 2018-01-11 MED ORDER — MELOXICAM 15 MG PO TABS: 15.0000 mg | ORAL_TABLET | Freq: Every day | ORAL | 0 refills | Status: DC

## 2018-01-11 NOTE — Telephone Encounter (Signed)
Copied from [facility] [place]. Topic: Quick Communication - See Telephone Encounter >> Jan 11, 2018  9:42 AM Conception Chancy, NT wrote: CRM for notification. See Telephone encounter for:  01/11/18.  [facility] is calling stating they received a refill request for meloxicam 15mg  and meloxicam 7.5mg  and needs clarification if the patient is supposed to be on both or if one needs to be discontinued.   CB# [phone]  Reference # [id number]

## 2018-01-22 ENCOUNTER — Encounter: Payer: Self-pay | Admitting: Internal Medicine

## 2018-01-22 DIAGNOSIS — E039 Hypothyroidism, unspecified: Secondary | ICD-10-CM

## 2018-01-22 NOTE — Addendum Note (Signed)
Addended by: Ellamae Sia on: 01/22/2018 02:04 PM   Modules accepted: Orders

## 2018-01-24 LAB — T4, FREE: Free T4: 1.05 ng/dL (ref 0.82–1.77)

## 2018-01-24 LAB — TSH: TSH: 0.029 u[IU]/mL — ABNORMAL LOW (ref 0.450–4.500)

## 2018-01-25 ENCOUNTER — Encounter: Payer: Self-pay | Admitting: Internal Medicine

## 2018-01-25 ENCOUNTER — Other Ambulatory Visit: Payer: Self-pay | Admitting: Internal Medicine

## 2018-01-25 DIAGNOSIS — E039 Hypothyroidism, unspecified: Secondary | ICD-10-CM

## 2018-01-25 MED ORDER — LEVOTHYROXINE SODIUM 50 MCG PO TABS: 50.0000 ug | ORAL_TABLET | Freq: Every day | ORAL | 0 refills | Status: DC

## 2018-01-26 ENCOUNTER — Encounter: Payer: Self-pay | Admitting: Internal Medicine

## 2018-01-28 ENCOUNTER — Telehealth: Payer: Self-pay

## 2018-01-28 DIAGNOSIS — F411 Generalized anxiety disorder: Secondary | ICD-10-CM

## 2018-01-28 MED ORDER — PROPRANOLOL HCL 10 MG PO TABS: 10.0000 mg | ORAL_TABLET | Freq: Three times a day (TID) | ORAL | 0 refills | Status: DC | PRN

## 2018-01-28 NOTE — Telephone Encounter (Signed)
receuved a fax requesting refill on inderal 10mg  pt was last seen on  12-19-17 next appt 02-18-18   propranolol (INDERAL) 10 MG tablet  Medication  Date: 12/19/2017 Department: [facility] Ordering/Authorizing: Ursula Alert, MD  Order Providers   Prescribing Provider Encounter Provider  Ursula Alert, MD Ursula Alert, MD  Medication Detail    Disp Refills Start End   propranolol (INDERAL) 10 MG tablet 180 tablet 0 12/19/2017    Sig - Route: Take 1 tablet (10 mg total) by mouth 3 (three) times daily as needed. - Oral   Sent to pharmacy as: propranolol (INDERAL) 10 MG tablet   E-Prescribing Status: Receipt confirmed by pharmacy (12/19/2017 12:14 PM EST)

## 2018-01-28 NOTE — Telephone Encounter (Signed)
Sent her propranolol to pharmacy at [facility].

## 2018-02-07 ENCOUNTER — Ambulatory Visit: Payer: 59 | Admitting: Psychiatry

## 2018-02-07 ENCOUNTER — Other Ambulatory Visit: Payer: Self-pay

## 2018-02-07 ENCOUNTER — Other Ambulatory Visit: Payer: Self-pay | Admitting: Internal Medicine

## 2018-02-07 ENCOUNTER — Encounter: Payer: Self-pay | Admitting: Psychiatry

## 2018-02-07 VITALS — BP 129/75 | HR 71 | Temp 98.6°F | Wt 163.0 lb

## 2018-02-07 DIAGNOSIS — F4312 Post-traumatic stress disorder, chronic: Secondary | ICD-10-CM | POA: Diagnosis not present

## 2018-02-07 DIAGNOSIS — F411 Generalized anxiety disorder: Secondary | ICD-10-CM | POA: Diagnosis not present

## 2018-02-07 NOTE — Progress Notes (Signed)
[facility] MD OP Progress Note  02/07/2018 8:57 AM [name]  MRN:  [id number]  Chief Complaint: 'My father is sick.' Chief Complaint    Follow-up; Medication Refill     HPI: [name] is a 67 year old Caucasian female, retired, married, lives in [place], has a history of anxiety and PTSD, presented to the clinic today for a follow-up visit.  [name] reports that she just got back from [place] after her vacation.  She and her husband had gone there to spend some time on the beach.  She reports she enjoyed her break.  She reports however she is going through some emotional distress at this time.  She reports her father in [place] may have had a stroke.  He is currently unresponsive.  They had a CT scan done yesterday and the doctors does not think there is anything else they can do for him at this time.  He is 65 years old.  He is going to be discharged possibly tomorrow.  She reports she is going to fly to [place] with her husband to give emotional support to her sister who is taking care of her father.  She reports she does not feel sad about her father and feels sad about the way she feels at this time.  Some time was spent providing supportive psychotherapy.  Discussed with her that it is okay to feel what she feels at this time given the history of trauma.  Discussed with her to be self aware of her emotions and to ask for support when she needs it.  She also discussed having some variations with her thyroid panel.  Her PMD is closely monitoring it.  She reports her dosage was reduced and she feels like she has gained some weight.  She however reports she wants to give it more time before she changes anything.  She reports she is a very active person and is hoping she can check the weight.  Also discussed with her about medication side effects like Remeron which can cause weight gain.  However discussed that we can wait until her thyroid hormone is more stable to make a decision about readjusting her  antianxiety agent.  She agrees with plan   Visit Diagnosis:    ICD-10-CM   1. GAD (generalized anxiety disorder) F41.1   2. Chronic post-traumatic stress disorder (PTSD) F43.12     Past Psychiatric History: She used to follow-up with [facility] counseling for 15 years or so.    Reports a history of suicidality when her mother passed away of cancer 18 years ago.  Past trials of Wellbutrin, Prozac, Klonopin.  Past Medical History:  Past Medical History:  Diagnosis Date  . Anxiety   . Depression   . Thyroid disease     Past Surgical History:  Procedure Laterality Date  . ABDOMINAL HYSTERECTOMY  1992   Total  . BREAST BIOPSY Right 1960's   negative  . BREAST BIOPSY Right 07/20/2015   neg  . CERVICAL FUSION  2005,2010   C2-C5  . REDUCTION MAMMAPLASTY Bilateral 2016  . TONSILLECTOMY  1961    Family Psychiatric History: Mother-abused opiates, daughter-mental health issues even though she is very high functioning, mother-depression.  Family History:  Family History  Problem Relation Age of Onset  . Cancer Mother        Uterine, Ovarian  . Anxiety disorder Mother   . Depression Mother   . Heart disease Father   . Stroke Father   . Diabetes Father   .  Cancer Maternal Grandmother   . ADD / ADHD Daughter   . ADD / ADHD Grandchild   . Prostate cancer Neg Hx   . Kidney cancer Neg Hx   . Breast cancer Neg Hx    Substance abuse history: Denies  Social History: Married.  Retired.  Used to work with [employer] health in the past.  She currently lives in [place] with her husband.  She has 2 children, a son and a daughter. Social History   Socioeconomic History  . Marital status: Married    Spouse name: jeff  . Number of children: 2  . Years of education: None  . Highest education level: Master's degree (e.g., MA, MS, MEng, MEd, MSW, MBA)  Social Needs  . Financial resource strain: Not hard at all  . Food insecurity - worry: Never true  . Food insecurity - inability: Never  true  . Transportation needs - medical: No  . Transportation needs - non-medical: No  Occupational History    Comment: retired  Tobacco Use  . Smoking status: Never Smoker  . Smokeless tobacco: Never Used  Substance and Sexual Activity  . Alcohol use: Yes    Alcohol/week: 0.6 oz    Types: 1 Glasses of wine per week    Comment: 2-3 nights week--1 glass of wine  . Drug use: No  . Sexual activity: Yes    Birth control/protection: None  Other Topics Concern  . None  Social History Narrative  . None    Allergies:  Allergies  Allergen Reactions  . Codeine Hives  . Iodinated Diagnostic Agents Itching  . Metrizamide Itching    Metabolic Disorder Labs: No results found for: HGBA1C, MPG No results found for: PROLACTIN Lab Results  Component Value Date   CHOL 264 (H) 10/09/2016   TRIG 79 10/09/2016   HDL 93 10/09/2016   CHOLHDL 2.8 10/09/2016   LDLCALC 155 (H) 10/09/2016   Lab Results  Component Value Date   TSH 0.029 (L) 01/23/2018   TSH 0.214 (L) 11/22/2017    Therapeutic Level Labs: No results found for: LITHIUM No results found for: VALPROATE No components found for:  CBMZ  Current Medications: Current Outpatient Medications  Medication Sig Dispense Refill  . acyclovir (ZOVIRAX) 200 MG capsule TAKE 1 CAPSULE BY MOUTH  EVERY DAY AS NEEDED 90 capsule 0  . AUTOLOGOUS CULTURE CHONDROCYTE IX Apply to eye.    . busPIRone (BUSPAR) 10 MG tablet Take 1 tablet (10 mg total) by mouth 3 (three) times daily. 270 tablet 0  . clonazePAM (KLONOPIN) 0.5 MG tablet Take 1 tablet (0.5 mg total) by mouth daily as needed for anxiety. 30 tablet 0  . doxycycline (MONODOX) 50 MG capsule TAKE 1 CAPSULE (50 MG TOTAL) BY MOUTH ONCE DAILY.  2  . DUREZOL 0.05 % EMUL Place 1 drop into both eyes 2 (two) times daily.  0  . gabapentin (NEURONTIN) 300 MG capsule Take 1 capsule (300 mg total) by mouth 3 (three) times daily. 270 capsule 0  . ibuprofen (ADVIL,MOTRIN) 200 MG tablet Take 200 mg by  mouth every 6 (six) hours as needed for moderate pain.    Marland Kitchen levothyroxine (SYNTHROID, LEVOTHROID) 50 MCG tablet Take 1 tablet (50 mcg total) by mouth daily. 30 tablet 0  . meloxicam (MOBIC) 15 MG tablet Take 1 tablet (15 mg total) by mouth daily. 90 tablet 0  . mirtazapine (REMERON) 7.5 MG tablet Take 1 tablet (7.5 mg total) by mouth at bedtime. 90 tablet 0  .  Omega-3 Fatty Acids (FISH OIL) 1000 MG CAPS Take by mouth.    Vladimir Faster Glyc-Propyl Glyc PF (SYSTANE PRESERVATIVE FREE) 0.4-0.3 % SOLN Apply to eye.    . propranolol (INDERAL) 10 MG tablet Take 1 tablet (10 mg total) by mouth 3 (three) times daily as needed. 180 tablet 0  . solifenacin (VESICARE) 10 MG tablet Take 1 tablet (10 mg total) by mouth daily. 90 tablet 3  . White Petrolatum-Mineral Oil (GENTEAL TEARS NIGHT-TIME) OINT Apply to eye.     No current facility-administered medications for this visit.      Musculoskeletal: Strength & Muscle Tone: within normal limits Gait & Station: normal Patient leans: N/A  Psychiatric Specialty Exam: Review of Systems  Psychiatric/Behavioral: The patient is nervous/anxious.   All other systems reviewed and are negative.   Blood pressure 129/75, pulse 71, temperature 98.6 F (37 C), temperature source Oral, weight 163 lb (73.9 kg).Body mass index is 25.15 kg/m.  General Appearance: Casual  Eye Contact:  Fair  Speech:  Clear and Coherent  Volume:  Normal  Mood:  Anxious  Affect:  Congruent  Thought Process:  Goal Directed and Descriptions of Associations: Intact  Orientation:  Full (Time, Place, and Person)  Thought Content: Logical   Suicidal Thoughts:  No  Homicidal Thoughts:  No  Memory:  Immediate;   Fair Recent;   Fair Remote;   Fair  Judgement:  Fair  Insight:  Fair  Psychomotor Activity:  Normal  Concentration:  Concentration: Fair and Attention Span: Fair  Recall:  [employer] of Knowledge: Fair  Language: Fair  Akathisia:  No  Handed:  Right  AIMS (if indicated):  NA  Assets:  Communication Skills Desire for Improvement Financial Resources/Insurance [place] Talents/Skills Transportation  ADL's:  Intact  Cognition: WNL  Sleep:  Fair   Screenings: PHQ2-9     Office Visit from 10/09/2016 in [place] at [facility] Visit from 04/18/2016 in [place] at [facility] Visit from 07/30/2014 in [place] at [facility] Total Score  0  0  0       Assessment and Plan: Khaylee is a 67 year old Caucasian female who is married, retired, lives in [place], has a history of PTSD as well as anxiety disorder, thyroid problems, eye problems who presented to the clinic today for a follow-up visit.  Aleira has an extensive history of being in psychotherapy/family therapy and medication management since the past 15 years or so.  She recently transitioned her care from [facility] to [facility].  Pt today reports she is having some stressors from her father's recent health condition.  She is also concerned about her thyroid problem as well as weight gain.  She denies any other problems at this time.  Discussed plan as noted below.  Plan GAD Gabapentin 300 mg p.o. 3 times daily Continue BuSpar 10 mg p.o. 3 times daily Continue Remeron 7.5 mg p.o. nightly Continue propranolol 10 mg p.o. 3 times daily as needed for anxiety symptoms.   PTSD Remeron 7.5 mg p.o. nightly Continue psychotherapy  For insomnia  Remeron 7.5 mg p.o. Nightly  Provided supportive psychotherapy for 10 minutes.  Follow-up in clinic in 2 months or sooner if needed.  More than 50 % of the time was spent for psychoeducation and supportive psychotherapy and care coordination.  This note was generated in part or whole with voice recognition software. Voice recognition is usually quite accurate but there are transcription errors that  can and very often do occur. I apologize for any typographical errors that were not detected  and corrected.          Ursula Alert, MD 02/07/2018, 8:57 AM

## 2018-02-11 ENCOUNTER — Telehealth: Payer: Self-pay | Admitting: Psychiatry

## 2018-02-11 ENCOUNTER — Ambulatory Visit: Payer: 59 | Admitting: Psychiatry

## 2018-02-11 ENCOUNTER — Ambulatory Visit: Payer: Managed Care, Other (non HMO) | Admitting: Psychiatry

## 2018-02-11 ENCOUNTER — Telehealth: Payer: Self-pay

## 2018-02-11 DIAGNOSIS — F411 Generalized anxiety disorder: Secondary | ICD-10-CM

## 2018-02-11 MED ORDER — GABAPENTIN 300 MG PO CAPS: 300.0000 mg | ORAL_CAPSULE | Freq: Three times a day (TID) | ORAL | 0 refills | Status: DC

## 2018-02-11 NOTE — Telephone Encounter (Signed)
DONE

## 2018-02-11 NOTE — Telephone Encounter (Signed)
I have refilled her gabapentin for 90-day supply, sent to [facility].

## 2018-02-18 ENCOUNTER — Ambulatory Visit: Payer: Managed Care, Other (non HMO) | Admitting: Psychiatry

## 2018-03-02 ENCOUNTER — Other Ambulatory Visit: Payer: Self-pay | Admitting: Psychiatry

## 2018-03-02 DIAGNOSIS — F411 Generalized anxiety disorder: Secondary | ICD-10-CM

## 2018-03-05 ENCOUNTER — Ambulatory Visit: Payer: Managed Care, Other (non HMO) | Admitting: Occupational Therapy

## 2018-03-08 ENCOUNTER — Ambulatory Visit: Payer: Managed Care, Other (non HMO) | Admitting: Occupational Therapy

## 2018-03-09 ENCOUNTER — Encounter: Payer: Self-pay | Admitting: Internal Medicine

## 2018-03-11 ENCOUNTER — Encounter: Payer: Self-pay | Admitting: Internal Medicine

## 2018-03-11 ENCOUNTER — Encounter: Payer: Self-pay | Admitting: Psychiatry

## 2018-03-11 ENCOUNTER — Ambulatory Visit: Payer: 59 | Admitting: Psychiatry

## 2018-03-11 ENCOUNTER — Other Ambulatory Visit: Payer: Self-pay

## 2018-03-11 ENCOUNTER — Other Ambulatory Visit: Payer: Self-pay | Admitting: Internal Medicine

## 2018-03-11 VITALS — BP 137/68 | HR 74 | Temp 98.6°F | Wt 161.2 lb

## 2018-03-11 DIAGNOSIS — F4312 Post-traumatic stress disorder, chronic: Secondary | ICD-10-CM | POA: Diagnosis not present

## 2018-03-11 DIAGNOSIS — F411 Generalized anxiety disorder: Secondary | ICD-10-CM | POA: Diagnosis not present

## 2018-03-11 MED ORDER — BUSPIRONE HCL 10 MG PO TABS: 20.0000 mg | ORAL_TABLET | Freq: Two times a day (BID) | ORAL | 1 refills | Status: DC
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
## 2018-03-12 ENCOUNTER — Other Ambulatory Visit: Payer: Self-pay | Admitting: Internal Medicine

## 2018-03-12 ENCOUNTER — Ambulatory Visit: Payer: Managed Care, Other (non HMO) | Attending: Rheumatology | Admitting: Occupational Therapy

## 2018-03-12 ENCOUNTER — Encounter: Payer: Self-pay | Admitting: Occupational Therapy

## 2018-03-12 ENCOUNTER — Encounter: Payer: Self-pay | Admitting: Psychiatry

## 2018-03-12 ENCOUNTER — Encounter: Payer: Self-pay | Admitting: Internal Medicine

## 2018-03-12 ENCOUNTER — Other Ambulatory Visit: Payer: Self-pay

## 2018-03-12 DIAGNOSIS — M79641 Pain in right hand: Secondary | ICD-10-CM | POA: Diagnosis present

## 2018-03-12 DIAGNOSIS — M79642 Pain in left hand: Secondary | ICD-10-CM | POA: Diagnosis present

## 2018-03-12 DIAGNOSIS — M25642 Stiffness of left hand, not elsewhere classified: Secondary | ICD-10-CM | POA: Insufficient documentation

## 2018-03-12 DIAGNOSIS — M25641 Stiffness of right hand, not elsewhere classified: Secondary | ICD-10-CM | POA: Insufficient documentation

## 2018-03-12 DIAGNOSIS — E039 Hypothyroidism, unspecified: Secondary | ICD-10-CM

## 2018-03-12 LAB — T4, FREE: FREE T4: 0.97 ng/dL (ref 0.82–1.77)

## 2018-03-12 LAB — TSH: TSH: 4.58 u[IU]/mL — AB (ref 0.450–4.500)

## 2018-03-12 NOTE — Patient Instructions (Addendum)
Paraffin to use 2 x day   AROM for tendon glides - pain free  RD of digits  And Opposition AROM to all digits    Joint protection and AE  Education for gardening , cooking  And over all use of hands in ADL's and IADL's  Info on silver ring splints

## 2018-03-12 NOTE — Therapy (Signed)
[place] [facility] [address] [place], [address] Phone: [phone]   Fax:  [phone]  Occupational Therapy Evaluation  Patient Details  Name: [name] MRN: [id number] Date of Birth: Oct 07, 1951 Referring Provider: Jefm Bryant    Encounter Date: 03/12/2018  OT End of Session - 03/12/18 1627    Visit Number  1    Number of Visits  4    Date for OT Re-Evaluation  04/09/18    OT Start Time  1005    OT Stop Time  1112    OT Time Calculation (min)  67 min    Activity Tolerance  Patient tolerated treatment well    Behavior During Therapy  [facility] for tasks assessed/performed       Past Medical History:  Diagnosis Date  . Anxiety   . Depression   . Thyroid disease     Past Surgical History:  Procedure Laterality Date  . ABDOMINAL HYSTERECTOMY  1992   Total  . BREAST BIOPSY Right 1960's   negative  . BREAST BIOPSY Right 07/20/2015   neg  . CERVICAL FUSION  2005,2010   C2-C5  . REDUCTION MAMMAPLASTY Bilateral 2016  . TONSILLECTOMY  1961    There were no vitals filed for this visit.  Subjective Assessment - 03/12/18 1131    Subjective   My hands gradually got worse but now that I think my pinkies started bothering me after I upholstered at [place], and I love gardening - pain and stiffness but mostly pain in  my pinkie L worse then R     Patient Stated Goals  I just don't want my hands to get worse,  and all my fingers look like my pinkie - and I want to be able to work in my garden /yard until I'm 82     Currently in Pain?  Yes    Pain Score  2     Pain Location  Finger (Comment which one)    Pain Orientation  Right;Left    Pain Descriptors / Indicators  Aching    Pain Type  Chronic pain    Pain Onset  More than a month ago    Pain Frequency  Constant        [facility] OT Assessment - 03/12/18 0001      Assessment   Medical Diagnosis  OA with hand pain     Referring Provider  [facility]     Onset  Date/Surgical Date  02/07/18    Hand Dominance  Right      Home  Environment   Lives With  Spouse      Prior Function   Vocation  Retired Therapist, sports    Leisure  likes to work in yard, some cooking ,  on phone, has Chief Technology Officer Grip (lbs)  40    Right Hand Lateral Pinch  14 lbs    Right Hand 3 Point Pinch  11 lbs    Left Hand Grip (lbs)  38    Left Hand Lateral Pinch  12 lbs    Left Hand 3 Point Pinch  11 lbs      Right Hand AROM   R Little PIP 0-100  95 Degrees    R Little DIP 0-70  -- nodules on DIP       Left Hand AROM   L Little PIP 0-100  95 Degrees    L Little  DIP 0-70  -- flexion contracture and RD at DIP         review HEP and education done - hand out provided    Paraffin to use 2 x day   AROM for tendon glides - pain free  RD of digits  And Opposition AROM to all digits    Joint protection and AE  Education for gardening , cooking  And over all use of hands in ADL's and IADL's  Info on silver ring splints               OT Education - 03/12/18 1626    Education provided  Yes    Education Details  findings of eval and HEP , joint protection and AE     Person(s) Educated  Patient    Methods  Explanation;Demonstration;Tactile cues;Handout    Comprehension  Returned demonstration;Verbalized understanding       OT Short Term Goals - 03/12/18 1637      OT SHORT TERM GOAL #1   Title  Pain in bilateral hands improve to 0-4/10 pain , and on PRWHE  with 15 points    Baseline  pain on PRWHE at eval 33/50     Time  2    Period  Weeks    Status  New    Target Date  03/26/18      OT SHORT TERM GOAL #2   Title  Pt to verbalize and implement 3 joint protection and AE to decrease pain and increaes ease in ADL's and IADL's     Baseline  very little knowledge -     Time  3    Period  Weeks    Status  New    Target Date  04/02/18        OT Long Term Goals - 03/12/18 1639      OT LONG TERM GOAL #1   Title  Pt to be independent in HEP  to maintain AROM in digits , decrease pain and increase functional use with and without AE / modifications     Baseline  no knowledge on HEP  or splints for digits    Time  4    Period  Weeks    Status  New    Target Date  04/09/18            Plan - 03/12/18 1628    Clinical Impression Statement  Pt present at OT eval with diagnosis of OA and pain in L hand more than R - pt show nodules on DIP's with 5th digits the worse, and arthritic deformity on L more than R -  she do have history of 3 cervical fusions - and pt report having sensory issues in bilateral UE -  Pt show increase pain - ranging from 2-8/10 with use or afterwards - pt AROM in digits are  WNL except PIP's of bilateral 5th digits - grip and prehension  strength is WFL - but on lower range for her age -  pt can benefit from OT services  to decrease pain , maintain AROM  , splints recommendations and joint protection and AE trng     Occupational performance deficits (Please refer to evaluation for details):  ADL's;IADL's;Play;Leisure    Rehab Potential  Good for goals    Current Impairments/barriers affecting progress:  chronic condition    OT Frequency  1x / week    OT Duration  4 weeks    OT Treatment/Interventions  Patient/family education;Splinting;Self-care/ADL  training;Therapeutic exercise;Paraffin;Contrast Bath;DME and/or AE instruction    Plan  assess progress with Home program - and modiifications -     Clinical Decision Making  Several treatment options, min-mod task modification necessary    OT Home Exercise Plan  see pt instruction    Consulted and Agree with Plan of Care  Patient       Patient will benefit from skilled therapeutic intervention in order to improve the following deficits and impairments:  Pain, Impaired flexibility, Impaired sensation, Impaired UE functional use  Visit Diagnosis: Pain in left hand - Plan: Ot plan of care cert/re-cert  Pain in right hand - Plan: Ot plan of care  cert/re-cert  Stiffness of left hand, not elsewhere classified - Plan: Ot plan of care cert/re-cert  Stiffness of right hand, not elsewhere classified - Plan: Ot plan of care cert/re-cert    Problem List Patient Active Problem List   Diagnosis Date Noted  . OAB (overactive bladder) 04/18/2016  . Anxiety and depression 07/30/2014  . Hypothyroidism 07/30/2014    Rosalyn Gess  OTR/L,CLT 03/12/2018, 4:45 PM  [place] [facility] [address] [place], [address] Phone: [phone]   Fax:  [phone]  Name: [name] MRN: [id number] Date of Birth: 1951/01/16

## 2018-03-16 ENCOUNTER — Encounter: Payer: Self-pay | Admitting: Internal Medicine

## 2018-03-19 ENCOUNTER — Ambulatory Visit: Payer: Managed Care, Other (non HMO) | Admitting: Occupational Therapy

## 2018-03-19 DIAGNOSIS — M79642 Pain in left hand: Secondary | ICD-10-CM | POA: Diagnosis not present

## 2018-03-19 DIAGNOSIS — M79641 Pain in right hand: Secondary | ICD-10-CM

## 2018-03-19 DIAGNOSIS — M25641 Stiffness of right hand, not elsewhere classified: Secondary | ICD-10-CM

## 2018-03-19 DIAGNOSIS — M25642 Stiffness of left hand, not elsewhere classified: Secondary | ICD-10-CM

## 2018-03-19 NOTE — Therapy (Signed)
[place] [facility] [address], [place], [address] Phone: [phone]   Fax:  [phone]  Occupational Therapy Treatment/discharge   Patient Details  Name: [name] MRN: [id number] Date of Birth: May 27, 1951 Referring Provider: Jefm Bryant    Encounter Date: 03/19/2018  OT End of Session - 03/19/18 1110    Visit Number  2    Number of Visits  2    Date for OT Re-Evaluation  03/19/18    OT Start Time  1046    OT Stop Time  1102    OT Time Calculation (min)  16 min    Activity Tolerance  Patient tolerated treatment well    Behavior During Therapy  [facility] for tasks assessed/performed       Past Medical History:  Diagnosis Date  . Anxiety   . Depression   . Thyroid disease     Past Surgical History:  Procedure Laterality Date  . ABDOMINAL HYSTERECTOMY  1992   Total  . BREAST BIOPSY Right 1960's   negative  . BREAST BIOPSY Right 07/20/2015   neg  . CERVICAL FUSION  2005,2010   C2-C5  . REDUCTION MAMMAPLASTY Bilateral 2016  . TONSILLECTOMY  1961    There were no vitals filed for this visit.  Subjective Assessment - 03/19/18 1109    Subjective   I did like you told me - got me paraffin bath - and that helps - did my exercises and tools change- I have question on the silver ring splints- pain better -at the worse maybe 3-4/10     Patient Stated Goals  I just don't want my hands to get worse,  and all my fingers look like my pinkie - and I want to be able to work in my garden /yard until I'm 82     Currently in Pain?  No/denies         [facility] OT Assessment - 03/19/18 0001      Strength   Right Hand Grip (lbs)  54    Right Hand Lateral Pinch  16 lbs    Right Hand 3 Point Pinch  11 lbs    Left Hand Grip (lbs)  50    Left Hand Lateral Pinch  13 lbs    Left Hand 3 Point Pinch  12 lbs      Right Hand AROM   R Little PIP 0-100  100 Degrees      Left Hand AROM   L Little PIP 0-100  95 Degrees         Pt report that she made modifications and joint protection - pain decrease   and did get paraffin bath  Pain is better  AROM in R 5th PIP WNL now  And grip and prehension increase - pt now above normal range for her age - and decrease pain   review and reinforce joint protection  And what to do if flare up - contrast - not paraffin  Pt fitted with oval 8 size 4 on 5th DIP for RD - little on snug part - but will order size 5 and 6  Pt ed on watching for redness - and should disappear in 45 min - wear only 30 min at time for 3-4 x day  Can wear these - and then if working order silver ring splint                  OT Education - 03/19/18 1110  Education provided  Yes    Education Details  Oval 8 splint fit and wearing - and recommendation about what silver ring splint - and joint protection /AE recommendations review    Person(s) Educated  Patient    Methods  Explanation;Demonstration;Tactile cues;Verbal cues    Comprehension  Returned demonstration       OT Short Term Goals - 03/19/18 1114      OT SHORT TERM GOAL #1   Title  Pain in bilateral hands improve to 0-4/10 pain , and on PRWHE  with 15 points    Baseline  Pain decrease to 3-4/10 at the worse -     Status  Achieved      OT SHORT TERM GOAL #2   Status  Achieved        OT Long Term Goals - 03/19/18 1115      OT LONG TERM GOAL #1   Title  Pt to be independent in HEP to maintain AROM in digits , decrease pain and increase functional use with and without AE / modifications     Status  Achieved            Plan - 03/19/18 1111    Clinical Impression Statement  Pt report that she did the modifications and joint protection  to tasks - did get paraffin bath  and more ergonomic  tools- pain is decrease  - and this date her grip strength increase to on upper range for her age - and AROM in R 5th  improve to WNL - PIP on L 5th still 5 degrees less- pt fitted with Oval 8 splint to decrease RD deviation  on L DIP - pt to wear no more than 30 min at time and check for redness  - and if working - can order Silver ring splint - pt discharge at this time with homeprogram     Current Impairments/barriers affecting progress:  chronic condition    OT Treatment/Interventions  Patient/family education;Splinting;Self-care/ADL training;Therapeutic exercise;Paraffin;Contrast Bath;DME and/or AE instruction    OT Home Exercise Plan  see pt instruction    Consulted and Agree with Plan of Care  Patient       Patient will benefit from skilled therapeutic intervention in order to improve the following deficits and impairments:     Visit Diagnosis: Pain in left hand  Pain in right hand  Stiffness of left hand, not elsewhere classified  Stiffness of right hand, not elsewhere classified    Problem List Patient Active Problem List   Diagnosis Date Noted  . OAB (overactive bladder) 04/18/2016  . Anxiety and depression 07/30/2014  . Hypothyroidism 07/30/2014    Rosalyn Gess  OTR/L,CLT 03/19/2018, 11:16 AM  [place] [facility] [address], [place], [address] Phone: [phone]   Fax:  [phone]  Name: Camika Marsico MRN: [id number] Date of Birth: 1951-06-12

## 2018-03-19 NOTE — Patient Instructions (Signed)
Cont same HEP from lat time

## 2018-03-22 ENCOUNTER — Other Ambulatory Visit: Payer: Self-pay | Admitting: Internal Medicine

## 2018-03-22 NOTE — Telephone Encounter (Signed)
Did you want to adjust dose of med until her endo appt? Or refill same Rx?

## 2018-03-22 NOTE — Telephone Encounter (Signed)
Stay the same. 

## 2018-04-04 ENCOUNTER — Ambulatory Visit: Payer: 59 | Admitting: Psychiatry

## 2018-04-08 ENCOUNTER — Other Ambulatory Visit: Payer: Self-pay

## 2018-04-08 ENCOUNTER — Ambulatory Visit: Payer: 59 | Admitting: Psychiatry

## 2018-04-08 ENCOUNTER — Encounter: Payer: Self-pay | Admitting: Psychiatry

## 2018-04-08 VITALS — BP 119/71 | HR 66 | Temp 98.8°F | Ht 66.0 in | Wt 162.6 lb

## 2018-04-08 DIAGNOSIS — Z634 Disappearance and death of family member: Secondary | ICD-10-CM | POA: Diagnosis not present

## 2018-04-08 DIAGNOSIS — F4312 Post-traumatic stress disorder, chronic: Secondary | ICD-10-CM

## 2018-04-08 DIAGNOSIS — F411 Generalized anxiety disorder: Secondary | ICD-10-CM
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
## 2018-04-12 ENCOUNTER — Other Ambulatory Visit: Payer: Self-pay | Admitting: Psychiatry

## 2018-04-12 DIAGNOSIS — F411 Generalized anxiety disorder: Secondary | ICD-10-CM

## 2018-04-19 ENCOUNTER — Other Ambulatory Visit: Payer: Self-pay | Admitting: Internal Medicine

## 2018-04-19 ENCOUNTER — Other Ambulatory Visit: Payer: Self-pay | Admitting: Psychiatry

## 2018-04-19 DIAGNOSIS — Z8619 Personal history of other infectious and parasitic diseases: Secondary | ICD-10-CM

## 2018-04-19 DIAGNOSIS — F411 Generalized anxiety disorder: Secondary | ICD-10-CM

## 2018-04-23 DIAGNOSIS — Z8619 Personal history of other infectious and parasitic diseases: Secondary | ICD-10-CM | POA: Insufficient documentation

## 2018-06-07 ENCOUNTER — Other Ambulatory Visit: Payer: Self-pay

## 2018-06-07 ENCOUNTER — Encounter: Payer: Self-pay | Admitting: Psychiatry

## 2018-06-07 ENCOUNTER — Ambulatory Visit (INDEPENDENT_AMBULATORY_CARE_PROVIDER_SITE_OTHER): Payer: 59 | Admitting: Psychiatry

## 2018-06-07 VITALS — BP 124/73 | HR 68 | Temp 99.0°F | Wt 163.0 lb

## 2018-06-07 DIAGNOSIS — F411 Generalized anxiety disorder: Secondary | ICD-10-CM

## 2018-06-07 DIAGNOSIS — F4312 Post-traumatic stress disorder, chronic: Secondary | ICD-10-CM | POA: Diagnosis not present

## 2018-06-07 DIAGNOSIS — Z634 Disappearance and death of family member: Secondary | ICD-10-CM

## 2018-06-07 MED ORDER — MIRTAZAPINE 7.5 MG PO TABS: 7.5000 mg | ORAL_TABLET | Freq: Every day | ORAL | 0 refills | Status: DC
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
## 2018-07-04 ENCOUNTER — Ambulatory Visit: Payer: Managed Care, Other (non HMO)

## 2018-07-08 ENCOUNTER — Ambulatory Visit (INDEPENDENT_AMBULATORY_CARE_PROVIDER_SITE_OTHER): Payer: Managed Care, Other (non HMO) | Admitting: Urology

## 2018-07-08 ENCOUNTER — Encounter: Payer: Self-pay | Admitting: Urology

## 2018-07-08 VITALS — BP 112/65 | HR 75 | Ht 66.0 in | Wt 161.6 lb

## 2018-07-08 DIAGNOSIS — N3946 Mixed incontinence: Secondary | ICD-10-CM

## 2018-07-08 NOTE — Progress Notes (Signed)
07/08/2018 11:54 AM   [name] Aug 11, 1951 [id number]  Referring provider: Jearld Fenton, NP [address] [place], [place] [address]  Chief Complaint  Patient presents with  . 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 She can leak with coughing sneezing and sometimes with bending and lifting and laughing. She only wears a pad when she was out in public. She denies enuresis.  She is currently on oxybutynin and failed.  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 No stress incontinence or prolapse   The patient has mild stress incontinence but primarily is bothered by urgency and almost daily urge incontinence. The patient was dramatically better on Vesicare almost completely dry on her last visit. Subsequently it was increased to Vesicare 10 mg  She brakes to 10 mg tablet and have to cutter cost by 50%. She does not believe the medication makes her eye dry which is a chronic problem but wanted to make certain it is not aggravated  Today Frequency stable.  Urge incontinence dramatically better.  Still cuts the tablet in half.  Did not need another prescription today.  Clinically not infected    PMH: Past Medical History:  Diagnosis Date  . Anxiety   . Depression   . Thyroid disease     Surgical History: Past Surgical History:  Procedure Laterality Date  . ABDOMINAL HYSTERECTOMY  1992   Total  . BREAST BIOPSY Right 1960's   negative  . BREAST BIOPSY Right 07/20/2015   neg  . CERVICAL FUSION  2005,2010   C2-C5  . REDUCTION MAMMAPLASTY Bilateral 2016  . TONSILLECTOMY  1961    Home Medications:  Allergies as of 07/08/2018      Reactions   Codeine Hives   Iodinated Diagnostic Agents Itching   Metrizamide Itching      Medication List        Accurate as of 07/08/18 11:54 AM. Always use your most recent med list.          acyclovir 200 MG capsule Commonly known as:   ZOVIRAX TAKE 1 CAPSULE BY MOUTH  EVERY DAY AS NEEDED   AUTOLOGOUS CULTURE CHONDROCYTE IX Apply to eye.   busPIRone 10 MG tablet Commonly known as:  BUSPAR TAKE 2 TABLETS BY MOUTH TWO TIMES DAILY   doxycycline 50 MG capsule Commonly known as:  MONODOX TAKE 1 CAPSULE (50 MG TOTAL) BY MOUTH ONCE DAILY.   DUREZOL 0.05 % Emul Generic drug:  Difluprednate Place 1 drop into both eyes 2 (two) times daily.   Fish Oil 1000 MG Caps Take by mouth.   gabapentin 300 MG capsule Commonly known as:  NEURONTIN Take 1 capsule (300 mg total) by mouth 3 (three) times daily.   GENTEAL TEARS NIGHT-TIME Oint Apply to eye.   ibuprofen 200 MG tablet Commonly known as:  ADVIL,MOTRIN Take 200 mg by mouth every 6 (six) hours as needed for moderate pain.   levothyroxine 50 MCG tablet Commonly known as:  SYNTHROID, LEVOTHROID TAKE 1 TABLET(50 MCG) BY MOUTH DAILY   meloxicam 15 MG tablet Commonly known as:  MOBIC TAKE 1 TABLET BY MOUTH  DAILY   mirtazapine 7.5 MG tablet Commonly known as:  REMERON Take 1 tablet (7.5 mg total) by mouth at bedtime.   propranolol 10 MG tablet Commonly known as:  INDERAL TAKE 1 TABLET BY MOUTH 3  TIMES DAILY AS NEEDED  solifenacin 10 MG tablet Commonly known as:  VESICARE Take 1 tablet (10 mg total) by mouth daily.   SYSTANE PRESERVATIVE FREE 0.4-0.3 % Soln Generic drug:  Polyethyl Glyc-Propyl Glyc PF Apply to eye.       Allergies:  Allergies  Allergen Reactions  . Codeine Hives  . Iodinated Diagnostic Agents Itching  . Metrizamide Itching    Family History: Family History  Problem Relation Age of Onset  . Cancer Mother        Uterine, Ovarian  . Anxiety disorder Mother   . Depression Mother   . Heart disease Father   . Stroke Father   . Diabetes Father   . Cancer Maternal Grandmother   . ADD / ADHD Daughter   . ADD / ADHD Grandchild   . Prostate cancer Neg Hx   . Kidney cancer Neg Hx   . Breast cancer Neg Hx     Social History:   reports that she has never smoked. She has never used smokeless tobacco. She reports that she drinks about 0.6 oz of alcohol per week. She reports that she does not use drugs.  ROS: UROLOGY Frequent Urination?: No Hard to postpone urination?: Yes Burning/pain with urination?: No Get up at night to urinate?: No Leakage of urine?: No Urine stream starts and stops?: No Trouble starting stream?: No Do you have to strain to urinate?: No Blood in urine?: No Urinary tract infection?: No Sexually transmitted disease?: No Injury to kidneys or bladder?: No Painful intercourse?: No Weak stream?: No Currently pregnant?: No Vaginal bleeding?: No Last menstrual period?: n  Gastrointestinal Nausea?: No Vomiting?: No Indigestion/heartburn?: No Diarrhea?: No Constipation?: No  Constitutional Fever: No Night sweats?: No Weight loss?: No Fatigue?: No  Skin Skin rash/lesions?: No Itching?: No  Eyes Blurred vision?: No Double vision?: No  Ears/Nose/Throat Sore throat?: No Sinus problems?: No  Hematologic/Lymphatic Swollen glands?: No Easy bruising?: No  Cardiovascular Leg swelling?: No Chest pain?: No  Respiratory Cough?: No Shortness of breath?: No  Endocrine Excessive thirst?: No  Musculoskeletal Back pain?: No Joint pain?: No  Neurological Headaches?: No Dizziness?: No  Psychologic Depression?: No Anxiety?: No  Physical Exam: BP 112/65 (BP Location: Left Arm, Patient Position: Sitting, Cuff Size: Normal)   Pulse 75   Ht 5\' 6"  (1.676 m)   Wt 161 lb 9.6 oz (73.3 kg)   BMI 26.08 kg/m   Constitutional:  Alert and oriented, No acute distress.   Laboratory Data: Lab Results  Component Value Date   WBC 14.1 (H) 03/08/2017   HGB 15.8 (H) 03/08/2017   HCT 46.9 (H) 03/08/2017   MCV 92.1 03/08/2017   PLT 227 03/08/2017    Lab Results  Component Value Date   CREATININE 0.87 03/08/2017    No results found for: PSA  No results found for:  TESTOSTERONE  No results found for: HGBA1C  Urinalysis    Component Value Date/Time   COLORURINE AMBER (A) 03/08/2017 1537   APPEARANCEUR CLEAR 03/08/2017 1537   APPEARANCEUR Hazy (A) 06/26/2016 0934   LABSPEC 1.023 03/08/2017 1537   PHURINE 6.0 03/08/2017 1537   GLUCOSEU NEGATIVE 03/08/2017 1537   HGBUR NEGATIVE 03/08/2017 1537   BILIRUBINUR NEGATIVE 03/08/2017 1537   BILIRUBINUR Negative 06/26/2016 0934   KETONESUR 5 (A) 03/08/2017 1537   PROTEINUR 30 (A) 03/08/2017 1537   UROBILINOGEN  03/20/2016 1043     Comment:     Unable to read due to pt taking AZO   UROBILINOGEN 1.0 04/29/2010 0749  NITRITE NEGATIVE 03/08/2017 1537   LEUKOCYTESUR NEGATIVE 03/08/2017 1537   LEUKOCYTESUR Trace (A) 06/26/2016 0934    Pertinent Imaging:   Assessment & Plan: 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  I will see her in a year  There are no diagnoses linked to this encounter.  No follow-ups on file.  Reece Packer, MD  [facility] [address] [place], [place] [address] [phone]

## 2018-08-04 ENCOUNTER — Other Ambulatory Visit: Payer: Self-pay | Admitting: Psychiatry

## 2018-08-04 DIAGNOSIS — F411 Generalized anxiety disorder: Secondary | ICD-10-CM

## 2018-08-26 ENCOUNTER — Other Ambulatory Visit: Payer: Self-pay | Admitting: Psychiatry

## 2018-10-15 ENCOUNTER — Other Ambulatory Visit: Payer: Self-pay | Admitting: Internal Medicine

## 2018-10-15 DIAGNOSIS — Z1231 Encounter for screening mammogram for malignant neoplasm of breast: Secondary | ICD-10-CM

## 2018-10-28 ENCOUNTER — Other Ambulatory Visit: Payer: Self-pay | Admitting: Psychiatry

## 2018-10-28 DIAGNOSIS — F411 Generalized anxiety disorder: Secondary | ICD-10-CM

## 2018-11-14 ENCOUNTER — Other Ambulatory Visit: Payer: Self-pay | Admitting: Psychiatry

## 2018-12-13 ENCOUNTER — Ambulatory Visit
Admission: RE | Admit: 2018-12-13 | Discharge: 2018-12-13 | Disposition: A | Discharge status: Home / Self Care | Payer: Managed Care, Other (non HMO) | Source: Ambulatory Visit | Attending: Internal Medicine | Admitting: Internal Medicine

## 2018-12-13 DIAGNOSIS — Z1231 Encounter for screening mammogram for malignant neoplasm of breast: Secondary | ICD-10-CM | POA: Diagnosis present

## 2019-01-21 ENCOUNTER — Other Ambulatory Visit: Payer: Self-pay | Admitting: Psychiatry

## 2019-01-21 ENCOUNTER — Encounter: Payer: Self-pay | Admitting: Internal Medicine

## 2019-01-21 ENCOUNTER — Ambulatory Visit: Payer: Managed Care, Other (non HMO) | Admitting: Family Medicine

## 2019-01-21 ENCOUNTER — Encounter: Payer: Self-pay | Admitting: Family Medicine

## 2019-01-21 VITALS — BP 120/68 | HR 62 | Temp 98.3°F | Ht 65.25 in | Wt 162.5 lb

## 2019-01-21 DIAGNOSIS — L237 Allergic contact dermatitis due to plants, except food: Secondary | ICD-10-CM | POA: Diagnosis not present

## 2019-01-21 DIAGNOSIS — F411 Generalized anxiety disorder: Secondary | ICD-10-CM

## 2019-01-21 MED ORDER — METHYLPREDNISOLONE ACETATE 80 MG/ML IJ SUSP
80.0000 mg | Freq: Once | INTRAMUSCULAR | Status: AC
  Administered 2019-01-21: 80 mg via INTRAMUSCULAR

## 2019-01-21 NOTE — Patient Instructions (Addendum)
If not improved or worsening next week, return   Poison Ivy Dermatitis  Poison ivy dermatitis is redness and soreness (inflammation) of the skin. It is caused by a chemical that is found on the leaves of the poison ivy plant. You may also have itching, a rash, and blisters. Symptoms often clear up in 1-2 weeks. You may get this condition by touching a poison ivy plant. You can also get it by touching something that has the chemical on it. This may include animals or objects that have come in contact with the plant. Follow these instructions at home: General instructions  Take or apply over-the-counter and prescription medicines only as told by your doctor.  If you touch poison ivy, wash your skin with soap and cold water right away.  Use hydrocortisone creams or calamine lotion as needed to help with itching.  Take oatmeal baths as needed. Use colloidal oatmeal. You can get this at a pharmacy or grocery store. Follow the instructions on the package.  Do not scratch or rub your skin.  While you have the rash, wash your clothes right after you wear them. Prevention   Know what poison ivy looks like so you can avoid it. This plant has three leaves with flowering branches on a single stem. The leaves are glossy. They have uneven edges that come to a point at the front.  If you have touched poison ivy, wash with soap and water right away. Be sure to wash under your fingernails.  When hiking or camping, wear long pants, a long-sleeved shirt, tall socks, and hiking boots. You can also use a lotion on your skin that helps to prevent contact with the chemical on the plant.  If you think that your clothes or outdoor gear came in contact with poison ivy, rinse them off with a garden hose before you bring them inside your house. Contact a doctor if:  You have open sores in the rash area.  You have more redness, swelling, or pain in the affected area.  You have redness that spreads beyond the  rash area.  You have fluid, blood, or pus coming from the affected area.  You have a fever.  You have a rash over a large area of your body.  You have a rash on your eyes, mouth, or genitals.  Your rash does not get better after a few days. Get help right away if:  Your face swells or your eyes swell shut.  You have trouble breathing.  You have trouble swallowing. This information is not intended to replace advice given to you by your health care provider. Make sure you discuss any questions you have with your health care provider. Document Released: 12/30/2010 Document Revised: 08/21/2018 Document Reviewed: 05/05/2015 Elsevier Interactive Patient Education  2019 [employer].

## 2019-01-21 NOTE — Progress Notes (Signed)
   Subjective:     [name] is a 68 y.o. female presenting for Rash (feels like she has poison ivey. Started to break out yesterday 01/20/2019- left forearm and left thumb has rash, itchy, tenderness, Used triamcinolone cream that she has left from last time. She also took some Benadryl last time.)     HPI  #Rash - breakout that started on 01/20/2019 - itching is severe - difficult to sleep - has been using triamcinolone 4-5 times a day - took benadryl last night - but makes her agitated - feels like it is spreading and popping up on other places - Ivy rest that she got off [place] - menthol spray which she used regularly - used a scrub   Review of Systems  Constitutional: Negative for chills and fever.  Skin: Positive for rash.     Social History   Tobacco Use  Smoking Status Never Smoker  Smokeless Tobacco Never Used        Objective:    BP Readings from Last 3 Encounters:  01/21/19 120/68  07/08/18 112/65  10/02/17 114/68   Wt Readings from Last 3 Encounters:  01/21/19 162 lb 8 oz (73.7 kg)  07/08/18 161 lb 9.6 oz (73.3 kg)  10/02/17 157 lb (71.2 kg)    BP 120/68   Pulse 62   Temp 98.3 F (36.8 C)   Ht 5' 5.25" (1.657 m)   Wt 162 lb 8 oz (73.7 kg)   SpO2 96%   BMI 26.83 kg/m    Physical Exam Constitutional:      General: She is not in acute distress.    Appearance: She is well-developed. She is not diaphoretic.  HENT:     Right Ear: External ear normal.     Left Ear: External ear normal.     Nose: Nose normal.  Eyes:     Conjunctiva/sclera: Conjunctivae normal.  Neck:     Musculoskeletal: Neck supple.  Cardiovascular:     Rate and Rhythm: Normal rate.  Pulmonary:     Effort: Pulmonary effort is normal.  Skin:    General: Skin is warm and dry.     Capillary Refill: Capillary refill takes less than 2 seconds.     Comments: Erythematous rash with scattered papules along the left forearm and the right and left hand  Neurological:   Mental Status: She is alert. Mental status is at baseline.  Psychiatric:        Mood and Affect: Mood normal.        Behavior: Behavior normal.           Assessment & Plan:   Problem List Items Addressed This Visit    None    Visit Diagnoses    Poison ivy    -  Primary   Relevant Medications   methylPREDNISolone acetate (DEPO-MEDROL) injection 80 mg (Completed)     Discussed the risk of worsening symptoms with steroids, but patient felt that this had worked in the past for her. Currently using topical steroids and menthol w/o improvement in symptoms or itching.   Injection provided today. Return if worsening  Return if symptoms worsen or fail to improve.  Lesleigh Noe, MD

## 2019-04-17 ENCOUNTER — Other Ambulatory Visit: Payer: Self-pay | Admitting: Psychiatry

## 2019-04-17 DIAGNOSIS — F411 Generalized anxiety disorder: Secondary | ICD-10-CM

## 2019-06-17 ENCOUNTER — Other Ambulatory Visit: Payer: Self-pay | Admitting: Psychiatry

## 2019-06-17 DIAGNOSIS — F411 Generalized anxiety disorder: Secondary | ICD-10-CM

## 2019-06-18 ENCOUNTER — Telehealth: Payer: Self-pay | Admitting: Urology

## 2019-06-18 MED ORDER — SOLIFENACIN SUCCINATE 5 MG PO TABS: 5.0000 mg | ORAL_TABLET | Freq: Every day | ORAL | 0 refills | Status: DC

## 2019-06-18 NOTE — Telephone Encounter (Signed)
1 refill sent to pharmacy, patient needs appt for future refills.

## 2019-06-18 NOTE — Telephone Encounter (Signed)
Pt needs refill for Vesicare 10mg , faxed to [facility]. Please advise.

## 2019-06-26 ENCOUNTER — Encounter: Payer: Self-pay | Admitting: Internal Medicine

## 2019-06-26 ENCOUNTER — Ambulatory Visit (INDEPENDENT_AMBULATORY_CARE_PROVIDER_SITE_OTHER): Payer: Managed Care, Other (non HMO) | Admitting: Internal Medicine

## 2019-06-26 ENCOUNTER — Other Ambulatory Visit: Payer: Self-pay

## 2019-06-26 ENCOUNTER — Telehealth: Payer: Self-pay

## 2019-06-26 VITALS — Temp 98.0°F | Wt 157.0 lb

## 2019-06-26 DIAGNOSIS — Z20828 Contact with and (suspected) exposure to other viral communicable diseases: Secondary | ICD-10-CM

## 2019-06-26 DIAGNOSIS — Z20822 Contact with and (suspected) exposure to covid-19: Secondary | ICD-10-CM

## 2019-06-26 NOTE — Telephone Encounter (Signed)
She needs to make a virtual appt to order testing.

## 2019-06-26 NOTE — Telephone Encounter (Signed)
I am sorry I did not think we had to schedule virtual prior to ordering covid testing if pt did not have symptoms. I will make sure to continue scheduling virtual appts for you. Again I am sorry but pt scheduled virtual appt today at 2:30. Thank you.

## 2019-06-26 NOTE — Progress Notes (Signed)
Virtual Visit via Video Note  I connected with [name] on 06/26/19 at  2:30 PM EDT by a video enabled telemedicine application and verified that I am speaking with the correct person using two identifiers.  Location: Patient: Home Provider: Office   I discussed the limitations of evaluation and management by telemedicine and the availability of in person appointments. The patient expressed understanding and agreed to proceed.  History of Present Illness:  Pt requesting COVID testing. She recently travelled to [place] for a funeral and is concerned she may have been exposed to [place]. She is not having any symptoms, denies headache, runny nose, nasal congestion, ear pain, sore throat, loss of taste or smell, cough or SOB. She denies fever, chills or body aches. She has not had any known exposure.    Past Medical History:  Diagnosis Date  . Anxiety   . Depression   . Thyroid disease     Current Outpatient Medications  Medication Sig Dispense Refill  . acyclovir (ZOVIRAX) 200 MG capsule TAKE 1 CAPSULE BY MOUTH  EVERY DAY AS NEEDED 90 capsule 0  . AUTOLOGOUS CULTURE CHONDROCYTE IX Apply to eye.    . busPIRone (BUSPAR) 10 MG tablet Take 10 mg by mouth 2 (two) times daily.    Marland Kitchen doxycycline (MONODOX) 50 MG capsule TAKE 1 CAPSULE (50 MG TOTAL) BY MOUTH ONCE DAILY.  2  . DUREZOL 0.05 % EMUL Place 1 drop into both eyes 2 (two) times daily.  0  . gabapentin (NEURONTIN) 300 MG capsule Take 300 mg by mouth 2 (two) times daily.    Marland Kitchen ibuprofen (ADVIL,MOTRIN) 200 MG tablet Take 200 mg by mouth every 6 (six) hours as needed for moderate pain.    Marland Kitchen levothyroxine (SYNTHROID, LEVOTHROID) 75 MCG tablet Take 75 mcg by mouth daily before breakfast.    . mirtazapine (REMERON) 7.5 MG tablet TAKE 1 TABLET BY MOUTH AT  BEDTIME 90 tablet 0  . Omega-3 Fatty Acids (FISH OIL) 1000 MG CAPS Take by mouth.    Vladimir Faster Glyc-Propyl Glyc PF (SYSTANE PRESERVATIVE FREE) 0.4-0.3 % SOLN Apply to eye.    .  propranolol (INDERAL) 10 MG tablet TAKE 1 TABLET BY MOUTH 3  TIMES DAILY AS NEEDED 180 tablet 0  . solifenacin (VESICARE) 5 MG tablet Take 1 tablet (5 mg total) by mouth daily. 30 tablet 0  . White Petrolatum-Mineral Oil (GENTEAL TEARS NIGHT-TIME) OINT Apply to eye.     No current facility-administered medications for this visit.     Allergies  Allergen Reactions  . Codeine Hives  . Iodinated Diagnostic Agents Itching  . Metrizamide Itching    Family History  Problem Relation Age of Onset  . Cancer Mother        Uterine, Ovarian  . Anxiety disorder Mother   . Depression Mother   . Heart disease Father   . Stroke Father   . Diabetes Father   . Cancer Maternal Grandmother   . ADD / ADHD Daughter   . ADD / ADHD Grandchild   . Prostate cancer Neg Hx   . Kidney cancer Neg Hx   . Breast cancer Neg Hx     Social History   Socioeconomic History  . Marital status: Married    Spouse name: jeff  . Number of children: 2  . Years of education: Not on file  . Highest education level: Master's degree (e.g., MA, MS, MEng, MEd, MSW, MBA)  Occupational History    Comment: retired  Science writer  Needs  . Financial resource strain: Not hard at all  . Food insecurity    Worry: Never true    Inability: Never true  . Transportation needs    Medical: No    Non-medical: No  Tobacco Use  . Smoking status: Never Smoker  . Smokeless tobacco: Never Used  Substance and Sexual Activity  . Alcohol use: Yes    Alcohol/week: 1.0 standard drinks    Types: 1 Glasses of wine per week    Comment: 2-3 nights week--1 glass of wine  . Drug use: No  . Sexual activity: Yes    Birth control/protection: None  Lifestyle  . Physical activity    Days per week: 0 days    Minutes per session: 0 min  . Stress: Very much  Relationships  . Social connections    Talks on phone: More than three times a week    Gets together: Three times a week    Attends religious service: More than 4 times per year     Active member of club or organization: No    Attends meetings of clubs or organizations: Never    Relationship status: Married  . Intimate partner violence    Fear of current or ex partner: No    Emotionally abused: No    Physically abused: No    Forced sexual activity: No  Other Topics Concern  . Not on file  Social History Narrative  . Not on file     Constitutional: Denies fever, malaise, fatigue, headache or abrupt weight changes.  HEENT: Denies eye pain, eye redness, ear pain, ringing in the ears, wax buildup, runny nose, nasal congestion, bloody nose, or sore throat. Respiratory: Denies difficulty breathing, shortness of breath, cough or sputum production.   Cardiovascular: Denies chest pain, chest tightness, palpitations or swelling in the hands or feet.  Gastrointestinal: Denies abdominal pain, bloating, constipation, diarrhea or blood in the stool.  GU: Denies urgency, frequency, pain with urination, burning sensation, blood in urine, odor or discharge.  No other specific complaints in a complete review of systems (except as listed in HPI above).  Observations/Objective:  Wt Readings from Last 3 Encounters:  01/21/19 162 lb 8 oz (73.7 kg)  07/08/18 161 lb 9.6 oz (73.3 kg)  10/02/17 157 lb (71.2 kg)    General: Appears her stated age, well developed, well nourished in NAD. Skin: Warm, dry and intact. No rashes noted. HEENT: Head: normal shape and size; Eyes: sclera white, no icterus, conjunctiva pink, PERRLA and EOMs intact;  Pulmonary/Chest: Normal effort. No respiratory distress.  Neurological: Alert and oriented.   BMET    Component Value Date/Time   NA 137 03/08/2017 1245   NA 140 10/23/2016 1323   K 4.0 03/08/2017 1245   CL 103 03/08/2017 1245   CO2 24 03/08/2017 1245   GLUCOSE 115 (H) 03/08/2017 1245   BUN 15 03/08/2017 1245   BUN 17 10/23/2016 1323   CREATININE 0.87 03/08/2017 1245   CALCIUM 9.4 03/08/2017 1245   GFRNONAA >60 03/08/2017 1245   GFRAA  >60 03/08/2017 1245    Lipid Panel     Component Value Date/Time   CHOL 264 (H) 10/09/2016 1110   TRIG 79 10/09/2016 1110   HDL 93 10/09/2016 1110   CHOLHDL 2.8 10/09/2016 1110   LDLCALC 155 (H) 10/09/2016 1110    CBC    Component Value Date/Time   WBC 14.1 (H) 03/08/2017 1245   RBC 5.09 03/08/2017 1245   HGB 15.8 (  H) 03/08/2017 1245   HGB 13.2 10/23/2016 1323   HCT 46.9 (H) 03/08/2017 1245   HCT 40.6 10/23/2016 1323   PLT 227 03/08/2017 1245   PLT 218 10/23/2016 1323   MCV 92.1 03/08/2017 1245   MCV 92 10/23/2016 1323   MCH 31.0 03/08/2017 1245   MCHC 33.7 03/08/2017 1245   RDW 12.9 03/08/2017 1245   RDW 13.2 10/23/2016 1323    Hgb A1C No results found for: HGBA1C      Assessment and Plan:  Encounter for COVID Testing:  Instructed her to proceed to the drive up testing site for Novel Coronavirus testing Discussed the importance of quarantine, hand washing and mask wearing  Will follow up after results are back  Follow Up Instructions:    I discussed the assessment and treatment plan with the patient. The patient was provided an opportunity to ask questions and all were answered. The patient agreed with the plan and demonstrated an understanding of the instructions.   The patient was advised to call back or seek an in-person evaluation if the symptoms worsen or if the condition fails to improve as anticipated.    Webb Silversmith, NP

## 2019-06-26 NOTE — Telephone Encounter (Signed)
Pt was in [place] 06/18/19-06/23/19 for funeral that was on 06/20/19 and was not always wearing a mask;pt said in hotels along the way to [place]. No fever,chills,cough,S/T,SOB, muscle pain, diarrhea, H/A and no loss of taste/smell; no known exposure to definite positive covid but not sure since around large # of people for funeral and travel. Pt request order for covid testing sent to [place] (pt husband works at [employer]).

## 2019-07-01 LAB — NOVEL CORONAVIRUS, NAA: SARS-CoV-2, NAA: NOT DETECTED

## 2019-07-11 ENCOUNTER — Other Ambulatory Visit: Payer: Self-pay | Admitting: Psychiatry

## 2019-07-11 DIAGNOSIS — F411 Generalized anxiety disorder: Secondary | ICD-10-CM

## 2019-07-14 ENCOUNTER — Ambulatory Visit: Payer: Managed Care, Other (non HMO) | Admitting: Urology

## 2019-07-14 ENCOUNTER — Other Ambulatory Visit: Payer: Self-pay

## 2019-07-14 ENCOUNTER — Encounter: Payer: Self-pay | Admitting: Urology

## 2019-07-14 VITALS — BP 117/74 | HR 76 | Ht 65.25 in | Wt 162.0 lb

## 2019-07-14 DIAGNOSIS — N3946 Mixed incontinence: Secondary | ICD-10-CM | POA: Diagnosis not present

## 2019-07-14 MED ORDER — SOLIFENACIN SUCCINATE 10 MG PO TABS: 10.0000 mg | ORAL_TABLET | Freq: Every day | ORAL | 3 refills | Status: DC

## 2019-07-14 NOTE — Telephone Encounter (Signed)
Sent Remeron - pt has appt scheduled

## 2019-07-14 NOTE — Addendum Note (Signed)
Addended by: Verlene Mayer A on: 07/14/2019 10:58 AM   Modules accepted: Orders

## 2019-07-14 NOTE — Progress Notes (Signed)
07/14/2019 10:42 AM   [name] [birth year] [id number]  Referring provider: Jearld Fenton, NP [address] [place],  [name] [address]  Chief Complaint  Patient presents with  . 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 She can leak with coughing sneezing and sometimes with bending and lifting and laughing. She only wears a pad when she was out in public. She denies enuresis.  She is currently on oxybutynin and failed.  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 No stress incontinence or prolapse   The patient has mild stress incontinence but primarily is bothered by urgency and almost daily urge incontinence.The patient was dramatically better on Vesicare almost completely dry on her last visit.Subsequently it was increased to Vesicare 10 mg  She brakes to 10 mg tablet and have to cutter cost by 50%. She does not believe the medication makes her eye dry which is a chronic problem but wanted to make certain it is not aggravated  Today Continues to be dry on Vesicare 10 mg and cuts the tablets.  903 sent.  I will see her in 2 years and she will call.  No bladder infections.  Frequency stable.  We talked the long-term safety   PMH: Past Medical History:  Diagnosis Date  . Anxiety   . Depression   . Thyroid disease     Surgical History: Past Surgical History:  Procedure Laterality Date  . ABDOMINAL HYSTERECTOMY  1992   Total  . BREAST BIOPSY Right 1960's   negative  . BREAST BIOPSY Right 07/20/2015   neg  . CERVICAL FUSION  2005,2010   C2-C5  . REDUCTION MAMMAPLASTY Bilateral 2016  . TONSILLECTOMY  1961    Home Medications:  Allergies as of 07/14/2019      Reactions   Codeine Hives   Iodinated Diagnostic Agents Itching   Metrizamide Itching      Medication List       Accurate as of July 14, 2019 10:42 AM. If you have any questions, ask your nurse or doctor.         acyclovir 200 MG capsule Commonly known as: ZOVIRAX TAKE 1 CAPSULE BY MOUTH  EVERY DAY AS NEEDED   AUTOLOGOUS CULTURE CHONDROCYTE IX Apply to eye.   busPIRone 10 MG tablet Commonly known as: BUSPAR Take 10 mg by mouth 2 (two) times daily.   doxycycline 50 MG capsule Commonly known as: MONODOX 2 (two) times daily.   Durezol 0.05 % Emul Generic drug: Difluprednate Place 1 drop into both eyes 2 (two) times daily.   Fish Oil 1000 MG Caps Take by mouth.   gabapentin 300 MG capsule Commonly known as: NEURONTIN Take 300 mg by mouth 2 (two) times daily.   GenTeal Tears Night-Time Oint Apply to eye.   ibuprofen 200 MG tablet Commonly known as: ADVIL Take 200 mg by mouth every 6 (six) hours as needed for moderate pain.   levothyroxine 75 MCG tablet Commonly known as: SYNTHROID Take 75 mcg by mouth daily before breakfast.   mirtazapine 7.5 MG tablet Commonly known as: REMERON TAKE 1 TABLET BY MOUTH AT  BEDTIME   OCUVITE ADULT 50+ PO Take by mouth.   propranolol 10 MG tablet Commonly known as: INDERAL TAKE 1 TABLET BY MOUTH 3  TIMES DAILY AS NEEDED   solifenacin 5 MG tablet Commonly known as: VESICARE Take 1 tablet (5 mg total) by  mouth daily.   Systane Preservative Free 0.4-0.3 % Soln Generic drug: Polyethyl Glyc-Propyl Glyc PF Apply to eye.   Vitamin D3 50 MCG (2000 UT) capsule Take by mouth.       Allergies:  Allergies  Allergen Reactions  . Codeine Hives  . Iodinated Diagnostic Agents Itching  . Metrizamide Itching    Family History: Family History  Problem Relation Age of Onset  . Cancer Mother        Uterine, Ovarian  . Anxiety disorder Mother   . Depression Mother   . Heart disease Father   . Stroke Father   . Diabetes Father   . Cancer Maternal Grandmother   . ADD / ADHD Daughter   . ADD / ADHD Grandchild   . Prostate cancer Neg Hx   . Kidney cancer Neg Hx   . Breast cancer Neg Hx     Social History:  reports that she has never  smoked. She has never used smokeless tobacco. She reports current alcohol use of about 1.0 standard drinks of alcohol per week. She reports that she does not use drugs.  ROS: UROLOGY Frequent Urination?: No Hard to postpone urination?: No Burning/pain with urination?: No Get up at night to urinate?: No Leakage of urine?: No Urine stream starts and stops?: No Trouble starting stream?: No Do you have to strain to urinate?: No Blood in urine?: No Urinary tract infection?: No Sexually transmitted disease?: No Injury to kidneys or bladder?: No Painful intercourse?: No Weak stream?: No Currently pregnant?: No Vaginal bleeding?: No Last menstrual period?: n  Gastrointestinal Nausea?: No Vomiting?: No Indigestion/heartburn?: No Diarrhea?: No Constipation?: No  Constitutional Fever: No Night sweats?: No Weight loss?: No Fatigue?: No  Skin Skin rash/lesions?: No Itching?: No  Eyes Blurred vision?: No Double vision?: No  Ears/Nose/Throat Sore throat?: No Sinus problems?: No  Hematologic/Lymphatic Swollen glands?: No Easy bruising?: No  Cardiovascular Leg swelling?: No Chest pain?: No  Respiratory Cough?: No Shortness of breath?: No  Endocrine Excessive thirst?: No  Musculoskeletal Back pain?: No Joint pain?: No  Neurological Headaches?: No Dizziness?: No  Psychologic Depression?: No Anxiety?: No  Physical Exam: BP 117/74   Pulse 76   Ht 5' 5.25" (1.657 m)   Wt 162 lb (73.5 kg)   BMI 26.75 kg/m   Constitutional:  Alert and oriented, No acute distress.   Laboratory Data: Lab Results  Component Value Date   WBC 14.1 (H) 03/08/2017   HGB 15.8 (H) 03/08/2017   HCT 46.9 (H) 03/08/2017   MCV 92.1 03/08/2017   PLT 227 03/08/2017    Lab Results  Component Value Date   CREATININE 0.87 03/08/2017    No results found for: PSA  No results found for: TESTOSTERONE  No results found for: HGBA1C  Urinalysis    Component Value Date/Time    COLORURINE AMBER (A) 03/08/2017 1537   APPEARANCEUR CLEAR 03/08/2017 1537   APPEARANCEUR Hazy (A) 06/26/2016 0934   LABSPEC 1.023 03/08/2017 [place] 6.0 03/08/2017 1537   GLUCOSEU NEGATIVE 03/08/2017 1537   HGBUR NEGATIVE 03/08/2017 1537   BILIRUBINUR NEGATIVE 03/08/2017 1537   BILIRUBINUR Negative 06/26/2016 0934   KETONESUR 5 (A) 03/08/2017 1537   PROTEINUR 30 (A) 03/08/2017 1537   UROBILINOGEN  03/20/2016 1043     Comment:     Unable to read due to pt taking AZO   UROBILINOGEN 1.0 04/29/2010 0749   NITRITE NEGATIVE 03/08/2017 1537   LEUKOCYTESUR NEGATIVE 03/08/2017 1537   LEUKOCYTESUR Trace (  A) 06/26/2016 0934    Pertinent Imaging:   Assessment & Plan: We will call and make an appointment in approximately 2 years  There are no diagnoses linked to this encounter.  No follow-ups on file.  Reece Packer, MD  [facility] [address], [place], [place] [address] [phone]

## 2019-08-14 ENCOUNTER — Other Ambulatory Visit: Payer: Self-pay

## 2019-08-14 ENCOUNTER — Encounter: Payer: Self-pay | Admitting: Psychiatry

## 2019-08-14 ENCOUNTER — Ambulatory Visit: Payer: 59 | Admitting: Psychiatry

## 2019-08-14 ENCOUNTER — Ambulatory Visit (INDEPENDENT_AMBULATORY_CARE_PROVIDER_SITE_OTHER): Payer: 59 | Admitting: Psychiatry

## 2019-08-14 DIAGNOSIS — F411 Generalized anxiety disorder: Secondary | ICD-10-CM | POA: Insufficient documentation

## 2019-08-14 DIAGNOSIS — F4312 Post-traumatic stress disorder, chronic: Secondary | ICD-10-CM | POA: Diagnosis not present

## 2019-08-14 MED ORDER — PROPRANOLOL HCL 10 MG PO TABS: 10.0000 mg | ORAL_TABLET | Freq: Three times a day (TID) | ORAL | 1 refills | Status: DC | PRN

## 2019-08-14 MED ORDER — GABAPENTIN 300 MG PO CAPS: 300.0000 mg | ORAL_CAPSULE | Freq: Two times a day (BID) | ORAL | 1 refills | Status: DC

## 2019-08-14 MED ORDER — BUSPIRONE HCL 10 MG PO TABS: 10.0000 mg | ORAL_TABLET | Freq: Two times a day (BID) | ORAL | 1 refills | Status: DC

## 2019-08-14 MED ORDER — MIRTAZAPINE 7.5 MG PO TABS: 7.5000 mg | ORAL_TABLET | Freq: Every day | ORAL | 1 refills | Status: DC

## 2019-08-14 NOTE — Progress Notes (Signed)
Virtual Visit via Video Note  I connected with [name] on 08/14/19 at  3:30 PM EDT by a video enabled telemedicine application and verified that I am speaking with the correct person using two identifiers.   I discussed the limitations of evaluation and management by telemedicine and the availability of in person appointments. The patient expressed understanding and agreed to proceed.   I discussed the assessment and treatment plan with the patient. The patient was provided an opportunity to ask questions and all were answered. The patient agreed with the plan and demonstrated an understanding of the instructions.   The patient was advised to call back or seek an in-person evaluation if the symptoms worsen or if the condition fails to improve as anticipated.   [facility] MD OP Progress Note  08/14/2019 5:15 PM [name]  MRN:  [id number]  Chief Complaint:  Chief Complaint    Follow-up     HPI: [name] is a 68 year old Caucasian female, retired, married, lives in [place], has a history of PTSD , GAD was evaluated by telemedicine today.  Patient today reports she is currently doing well with regards to her anxiety symptoms.  She denies any significant mood lability.  She reports anxiety symptoms is under control on the current medication regimen.  She reports she was able to wean herself off of some of the medications.  She currently takes a lower dosage of BuSpar.  She continues to take the mirtazapine and the gabapentin.  She reports sleep is good.  She reports appetite is fair.  She reports she is currently doing well with regards to staying safe from the [place].  She continues to follow-up with Ms. [name] for psychotherapy sessions and reports it is going well.  She denies any suicidality, homicidality or perceptual disturbances.   Visit Diagnosis:    ICD-10-CM   1. GAD (generalized anxiety disorder)  F41.1 busPIRone (BUSPAR) 10 MG tablet    propranolol (INDERAL) 10 MG  tablet    mirtazapine (REMERON) 7.5 MG tablet    gabapentin (NEURONTIN) 300 MG capsule  2. Chronic post-traumatic stress disorder (PTSD)  F43.12     Past Psychiatric History: I have reviewed past psychiatric history from my progress note on 03/11/2018  Past Medical History:  Past Medical History:  Diagnosis Date  . Anxiety   . Depression   . Thyroid disease     Past Surgical History:  Procedure Laterality Date  . ABDOMINAL HYSTERECTOMY  1992   Total  . BREAST BIOPSY Right 1960's   negative  . BREAST BIOPSY Right 07/20/2015   neg  . CERVICAL FUSION  2005,2010   C2-C5  . REDUCTION MAMMAPLASTY Bilateral 2016  . TONSILLECTOMY  1961    Family Psychiatric History: I have reviewed family psychiatric history from my progress note on 03/11/2018  Family History:  Family History  Problem Relation Age of Onset  . Cancer Mother        Uterine, Ovarian  . Anxiety disorder Mother   . Depression Mother   . Heart disease Father   . Stroke Father   . Diabetes Father   . Cancer Maternal Grandmother   . ADD / ADHD Daughter   . ADD / ADHD Grandchild   . Prostate cancer Neg Hx   . Kidney cancer Neg Hx   . Breast cancer Neg Hx     Social History: I have reviewed social history from my progress note on 03/11/2018 Social History   Socioeconomic History  . Marital  status: Married    Spouse name: jeff  . Number of children: 2  . Years of education: Not on file  . Highest education level: Master's degree (e.g., MA, MS, MEng, MEd, MSW, MBA)  Occupational History    Comment: retired  Scientific laboratory technician  . Financial resource strain: Not hard at all  . Food insecurity    Worry: Never true    Inability: Never true  . Transportation needs    Medical: No    Non-medical: No  Tobacco Use  . Smoking status: Never Smoker  . Smokeless tobacco: Never Used  Substance and Sexual Activity  . Alcohol use: Yes    Alcohol/week: 1.0 standard drinks    Types: 1 Glasses of wine per week    Comment: 2-3  nights week--1 glass of wine  . Drug use: No  . Sexual activity: Yes    Birth control/protection: None  Lifestyle  . Physical activity    Days per week: 0 days    Minutes per session: 0 min  . Stress: Very much  Relationships  . Social connections    Talks on phone: More than three times a week    Gets together: Three times a week    Attends religious service: More than 4 times per year    Active member of club or organization: No    Attends meetings of clubs or organizations: Never    Relationship status: Married  Other Topics Concern  . Not on file  Social History Narrative  . Not on file    Allergies:  Allergies  Allergen Reactions  . Codeine Hives  . Iodinated Diagnostic Agents Itching  . Metrizamide Itching    Metabolic Disorder Labs: No results found for: HGBA1C, MPG No results found for: PROLACTIN Lab Results  Component Value Date   CHOL 264 (H) 10/09/2016   TRIG 79 10/09/2016   HDL 93 10/09/2016   CHOLHDL 2.8 10/09/2016   LDLCALC 155 (H) 10/09/2016   Lab Results  Component Value Date   TSH 4.580 (H) 03/11/2018   TSH 0.029 (L) 01/23/2018    Therapeutic Level Labs: No results found for: LITHIUM No results found for: VALPROATE No components found for:  CBMZ  Current Medications: Current Outpatient Medications  Medication Sig Dispense Refill  . acyclovir (ZOVIRAX) 200 MG capsule TAKE 1 CAPSULE BY MOUTH  EVERY DAY AS NEEDED 90 capsule 0  . AUTOLOGOUS CULTURE CHONDROCYTE IX Apply to eye.    . busPIRone (BUSPAR) 10 MG tablet Take 1 tablet (10 mg total) by mouth 2 (two) times daily. 180 tablet 1  . Cholecalciferol (VITAMIN D3) 50 MCG (2000 UT) capsule Take by mouth.    . doxycycline (MONODOX) 50 MG capsule 2 (two) times daily.   2  . DUREZOL 0.05 % EMUL Place 1 drop into both eyes 2 (two) times daily.  0  . gabapentin (NEURONTIN) 300 MG capsule Take 1 capsule (300 mg total) by mouth 2 (two) times daily. 180 capsule 1  . ibuprofen (ADVIL,MOTRIN) 200 MG  tablet Take 200 mg by mouth every 6 (six) hours as needed for moderate pain.    Marland Kitchen levothyroxine (SYNTHROID, LEVOTHROID) 75 MCG tablet Take 75 mcg by mouth daily before breakfast.    . mirtazapine (REMERON) 7.5 MG tablet Take 1 tablet (7.5 mg total) by mouth at bedtime. 90 tablet 1  . Multiple Vitamins-Minerals (OCUVITE ADULT 50+ PO) Take by mouth.    . Omega-3 Fatty Acids (FISH OIL) 1000 MG CAPS Take by  mouth.    . Polyethyl Glyc-Propyl Glyc PF (SYSTANE PRESERVATIVE FREE) 0.4-0.3 % SOLN Apply to eye.    . propranolol (INDERAL) 10 MG tablet Take 1 tablet (10 mg total) by mouth 3 (three) times daily as needed. 180 tablet 1  . solifenacin (VESICARE) 10 MG tablet Take 1 tablet (10 mg total) by mouth daily. 90 tablet 3  . White Petrolatum-Mineral Oil (GENTEAL TEARS NIGHT-TIME) OINT Apply to eye.     No current facility-administered medications for this visit.      Musculoskeletal: Strength & Muscle Tone: UTA Gait & Station: normal Patient leans: N/A  Psychiatric Specialty Exam: Review of Systems  Psychiatric/Behavioral: Negative for hallucinations. The patient is not nervous/anxious.   All other systems reviewed and are negative.   There were no vitals taken for this visit.There is no height or weight on file to calculate BMI.  General Appearance: Casual  Eye Contact:  Fair  Speech:  Clear and Coherent  Volume:  Normal  Mood:  Euthymic  Affect:  Appropriate  Thought Process:  Goal Directed and Descriptions of Associations: Intact  Orientation:  Full (Time, Place, and Person)  Thought Content: Logical   Suicidal Thoughts:  No  Homicidal Thoughts:  No  Memory:  Immediate;   Fair Recent;   Fair Remote;   Fair  Judgement:  Fair  Insight:  Fair  Psychomotor Activity:  Normal  Concentration:  Concentration: Fair and Attention Span: Fair  Recall:  [employer] of Knowledge: Fair  Language: Fair  Akathisia:  No  Handed:  Right  AIMS (if indicated): uta  Assets:  Communication  Skills Desire for Improvement Housing Intimacy Social Support  ADL's:  Intact  Cognition: WNL  Sleep:  Fair   Screenings: GAD-7     Office Visit from 08/14/2019 in [facility]  Total GAD-7 Score  5    PHQ2-9     Office Visit from 10/09/2016 in [place] at [facility] Visit from 04/18/2016 in [place] at [facility] Visit from 07/30/2014 in [place] at [facility] Total Score  0  0  0       Assessment and Plan: Ciji is a 68 year old Caucasian female who is married, retired, lives in [place], has a history of PTSD, anxiety disorder, thyroid problems, was evaluated by telemedicine today.  Patient is currently making progress on the current medication regimen.  She will continue psychotherapy sessions.  Plan GAD-stable Gabapentin 300 mg p.o. 3 times daily. BuSpar 10 mg p.o. twice daily Remeron 7.5 mg p.o. nightly Propranolol 10 mg p.o. 3 times daily as needed GAD 7 equals 5  PTSD- stable Continue CBT with Ms. [name]  Insomnia-stable Mirtazapine 7.5 mg p.o. nightly  Follow-up in clinic in 4 to 6 months or sooner if needed.  March 3 at 2 PM  I have spent atleast 15 minutes non face to face with patient today. More than 50 % of the time was spent for psychoeducation and supportive psychotherapy and care coordination. This note was generated in part or whole with voice recognition software. Voice recognition is usually quite accurate but there are transcription errors that can and very often do occur. I apologize for any typographical errors that were not detected and corrected.       Ursula Alert, MD 08/14/2019, 5:15 PM

## 2019-09-11 ENCOUNTER — Encounter: Payer: Self-pay | Admitting: Internal Medicine

## 2019-10-02 ENCOUNTER — Other Ambulatory Visit: Payer: Self-pay | Admitting: Psychiatry

## 2019-10-02 DIAGNOSIS — F411 Generalized anxiety disorder: Secondary | ICD-10-CM

## 2019-10-03 ENCOUNTER — Encounter: Payer: Self-pay | Admitting: Internal Medicine

## 2019-10-06 ENCOUNTER — Other Ambulatory Visit: Payer: Self-pay

## 2019-10-06 DIAGNOSIS — Z20822 Contact with and (suspected) exposure to covid-19: Secondary | ICD-10-CM

## 2019-10-08 ENCOUNTER — Telehealth: Payer: Self-pay

## 2019-10-08 DIAGNOSIS — Z20828 Contact with and (suspected) exposure to other viral communicable diseases: Secondary | ICD-10-CM

## 2019-10-08 DIAGNOSIS — Z20822 Contact with and (suspected) exposure to covid-19: Secondary | ICD-10-CM

## 2019-10-08 LAB — NOVEL CORONAVIRUS, NAA: SARS-CoV-2, NAA: DETECTED — AB

## 2019-10-08 NOTE — Telephone Encounter (Signed)
Merry Proud (DPR left v/m requesting cb to go over pts symptoms due to + covid testing. I left v/m requesting Merry Proud to call [facility].

## 2019-10-08 NOTE — Telephone Encounter (Signed)
jeff (DRP signed) and wants to know what can be done to help pt with her covid symptoms. Pt spoke with [facility] nurse about symptoms. Merry Proud said pt has fever 100.4 now and chills; pt has had fever for 4 days. Muscle achy all over; extreme fatigue, difficult for pt to get out of bed. Headache now is 7 pain level; earlier h/a was an 8. Prod cough with yellow creamy phlegm. Pt has joint pain all over body. Fine rash that pt can feel more than see is on ankles. No CP, pt has SOB after coughing episode; N&V& diarrhea; last vomited 2 days ago; last watery diarrhea was 44 hrs ago.pt having terrible sharp lower abd pain on 10/06/19.last urinated this morning at 8:30 AM; pt has been trying to drink but not eating. Pt has S/T,and double vision last night and today,. Pt is feeling miserable. Pt said she cannot sit up in bed she is so weak. Advised pt she needs to go to ED for eval. Pt said she is not going to go to ED to sit for hours. Avie Echevaria NP advised that pt does need to be evaluated at ED and a virtual appt cannot be scheduled due to pt needing a face to face visit. Pt voiced understanding and her husband will take pt to [facility] ED, 911 was denied by pt. pts husband has positive covid also and I offered to call [facility] ED for protocol of when pt gets to ED what to do or is there a number to call and someone will come to car to assist pt in w/c. Pt said no her husband would call [facility].

## 2019-10-08 NOTE — Telephone Encounter (Signed)
Called office to report COVID-19 positive. Patient speaking to office about her symptoms. Left message with Marianna Fuss to have [facility] Silversmith or [place] call back for questions.

## 2019-10-11 ENCOUNTER — Telehealth: Payer: Self-pay

## 2019-10-11 ENCOUNTER — Emergency Department: Payer: Managed Care, Other (non HMO)

## 2019-10-11 ENCOUNTER — Emergency Department
Admission: EM | Admit: 2019-10-11 | Discharge: 2019-10-11 | Disposition: A | Discharge status: Home / Self Care | Payer: Managed Care, Other (non HMO) | Attending: Emergency Medicine | Admitting: Emergency Medicine

## 2019-10-11 ENCOUNTER — Other Ambulatory Visit: Payer: Self-pay

## 2019-10-11 DIAGNOSIS — R918 Other nonspecific abnormal finding of lung field: Secondary | ICD-10-CM

## 2019-10-11 DIAGNOSIS — U071 COVID-19: Secondary | ICD-10-CM | POA: Insufficient documentation

## 2019-10-11 DIAGNOSIS — E039 Hypothyroidism, unspecified: Secondary | ICD-10-CM | POA: Insufficient documentation

## 2019-10-11 DIAGNOSIS — Z79899 Other long term (current) drug therapy: Secondary | ICD-10-CM | POA: Diagnosis not present

## 2019-10-11 DIAGNOSIS — R05 Cough: Secondary | ICD-10-CM | POA: Diagnosis present

## 2019-10-11 LAB — CBC WITH DIFFERENTIAL/PLATELET
Abs Immature Granulocytes: 0 10*3/uL (ref 0.00–0.07)
Basophils Absolute: 0 10*3/uL (ref 0.0–0.1)
Basophils Relative: 0 %
Eosinophils Absolute: 0 10*3/uL (ref 0.0–0.5)
Eosinophils Relative: 1 %
HCT: 44.3 % (ref 36.0–46.0)
Hemoglobin: 14.5 g/dL (ref 12.0–15.0)
Immature Granulocytes: 0 %
Lymphocytes Relative: 53 %
Lymphs Abs: 1.9 10*3/uL (ref 0.7–4.0)
MCH: 30.1 pg (ref 26.0–34.0)
MCHC: 32.7 g/dL (ref 30.0–36.0)
MCV: 91.9 fL (ref 80.0–100.0)
Monocytes Absolute: 0.3 10*3/uL (ref 0.1–1.0)
Monocytes Relative: 10 %
Neutro Abs: 1.3 10*3/uL — ABNORMAL LOW (ref 1.7–7.7)
Neutrophils Relative %: 36 %
Platelets: 149 10*3/uL — ABNORMAL LOW (ref 150–400)
RBC: 4.82 MIL/uL (ref 3.87–5.11)
RDW: 13 % (ref 11.5–15.5)
WBC: 3.5 10*3/uL — ABNORMAL LOW (ref 4.0–10.5)
nRBC: 0 % (ref 0.0–0.2)

## 2019-10-11 LAB — COMPREHENSIVE METABOLIC PANEL
ALT: 22 U/L (ref 0–44)
AST: 29 U/L (ref 15–41)
Albumin: 4.1 g/dL (ref 3.5–5.0)
Alkaline Phosphatase: 65 U/L (ref 38–126)
Anion gap: 7 (ref 5–15)
BUN: 18 mg/dL (ref 8–23)
CO2: 28 mmol/L (ref 22–32)
Calcium: 8.8 mg/dL — ABNORMAL LOW (ref 8.9–10.3)
Chloride: 107 mmol/L (ref 98–111)
Creatinine, Ser: 0.78 mg/dL (ref 0.44–1.00)
GFR calc Af Amer: >60 mL/min (ref >60)
GFR calc non Af Amer: >60 mL/min (ref >60)
Glucose, Bld: 97 mg/dL (ref 70–99)
Potassium: 3.6 mmol/L (ref 3.5–5.1)
Sodium: 142 mmol/L (ref 135–145)
Total Bilirubin: 0.3 mg/dL (ref 0.3–1.2)
Total Protein: 6.8 g/dL (ref 6.5–8.1)

## 2019-10-11 LAB — TROPONIN I (HIGH SENSITIVITY): Troponin I (High Sensitivity): 4 ng/L (ref <18)

## 2019-10-11 NOTE — ED Provider Notes (Signed)
[facility] Emergency Department Provider Note   ____________________________________________   First MD Initiated Contact with Patient 10/11/19 1803     (approximate)  I have reviewed the triage vital signs and the nursing notes.   HISTORY  Chief Complaint COVID Positive   HPI [name] is a 68 y.o. female who was diagnosed with Covid 8 days ago.  She still running fevers up to 101 at home.  She is still feeling tired.  She has an occasional cough.  Otherwise she is feeling okay.  Infectious disease nurse at health department told her to come in here to be checked since she still having fever and tiredness 8 days out.         Past Medical History:  Diagnosis Date  . Anxiety   . Depression   . Thyroid disease     Patient Active Problem List   Diagnosis Date Noted  . GAD (generalized anxiety disorder) 08/14/2019  . Chronic post-traumatic stress disorder (PTSD) 08/14/2019  . History of cold sores 04/23/2018  . OAB (overactive bladder) 04/18/2016  . Anxiety and depression 07/30/2014  . Hypothyroidism 07/30/2014    Past Surgical History:  Procedure Laterality Date  . ABDOMINAL HYSTERECTOMY  1992   Total  . BREAST BIOPSY Right 1960's   negative  . BREAST BIOPSY Right 07/20/2015   neg  . CERVICAL FUSION  2005,2010   C2-C5  . REDUCTION MAMMAPLASTY Bilateral 2016  . TONSILLECTOMY  1961    Prior to Admission medications   Medication Sig Start Date End Date Taking? Authorizing Provider  acyclovir (ZOVIRAX) 200 MG capsule TAKE 1 CAPSULE BY MOUTH  EVERY DAY AS NEEDED 09/05/17   Jearld Fenton, NP  AUTOLOGOUS CULTURE CHONDROCYTE IX Apply to eye. 03/29/17   [provider]  busPIRone (BUSPAR) 10 MG tablet Take 1 tablet (10 mg total) by mouth 2 (two) times daily. 08/14/19   Ursula Alert, MD  Cholecalciferol (VITAMIN D3) 50 MCG (2000 UT) capsule Take by mouth.    [provider]  doxycycline (MONODOX) 50 MG capsule 2 (two)  times daily.  09/09/17   [provider]  DUREZOL 0.05 % EMUL Place 1 drop into both eyes 2 (two) times daily. 01/08/17   [provider]  gabapentin (NEURONTIN) 300 MG capsule Take 1 capsule (300 mg total) by mouth 2 (two) times daily. 08/14/19   Ursula Alert, MD  ibuprofen (ADVIL,MOTRIN) 200 MG tablet Take 200 mg by mouth every 6 (six) hours as needed for moderate pain.    [provider]  levothyroxine (SYNTHROID, LEVOTHROID) 75 MCG tablet Take 75 mcg by mouth daily before breakfast.    [provider]  mirtazapine (REMERON) 7.5 MG tablet Take 1 tablet (7.5 mg total) by mouth at bedtime. 08/14/19   Ursula Alert, MD  Multiple Vitamins-Minerals (OCUVITE ADULT 50+ PO) Take by mouth.    [provider]  Omega-3 Fatty Acids (FISH OIL) 1000 MG CAPS Take by mouth.    [provider]  Polyethyl Glyc-Propyl Glyc PF (SYSTANE PRESERVATIVE FREE) 0.4-0.3 % SOLN Apply to eye.    [provider]  propranolol (INDERAL) 10 MG tablet TAKE 1 TABLET BY MOUTH 3  TIMES DAILY AS NEEDED 10/02/19   Ursula Alert, MD  solifenacin (VESICARE) 10 MG tablet Take 1 tablet (10 mg total) by mouth daily. 07/14/19   Bjorn Loser, MD  White Petrolatum-Mineral Oil (GENTEAL TEARS NIGHT-TIME) OINT Apply to eye.    [provider]  Allergies Codeine, Iodinated diagnostic agents, and Metrizamide  Family History  Problem Relation Age of Onset  . Cancer Mother        Uterine, Ovarian  . Anxiety disorder Mother   . Depression Mother   . Heart disease Father   . Stroke Father   . Diabetes Father   . Cancer Maternal Grandmother   . ADD / ADHD Daughter   . ADD / ADHD Grandchild   . Prostate cancer Neg Hx   . Kidney cancer Neg Hx   . Breast cancer Neg Hx     Social History Social History   Tobacco Use  . Smoking status: Never Smoker  . Smokeless tobacco: Never Used  Substance Use Topics  . Alcohol use: Yes    Alcohol/week: 1.0 standard  drinks    Types: 1 Glasses of wine per week    Comment: 2-3 nights week--1 glass of wine  . Drug use: No    Review of Systems  Constitutional:  fever/chills Eyes: No visual changes. ENT: No sore throat. Cardiovascular: Denies chest pain. Respiratory: Denies shortness of breath. Gastrointestinal: No abdominal pain.  No nausea, no vomiting.  No diarrhea.  No constipation. Genitourinary: Negative for dysuria. Musculoskeletal: Negative for back pain. Skin: Negative for rash. Neurological: Negative for headaches, focal weakness ____________________________________________   PHYSICAL EXAM:  VITAL SIGNS: ED Triage Vitals  Enc Vitals Group     BP 10/11/19 1642 129/66     Pulse Rate 10/11/19 1642 63     Resp 10/11/19 1642 18     Temp 10/11/19 1642 99.5 F (37.5 C)     Temp Source 10/11/19 1642 Oral     SpO2 10/11/19 1642 96 %     Weight 10/11/19 1641 150 lb (68 kg)     Height 10/11/19 1641 5\' 6"  (1.676 m)     Head Circumference --      Peak Flow --      Pain Score 10/11/19 1641 0     Pain Loc --      Pain Edu? --      Excl. in [place]? --     Constitutional: Alert and oriented. Well appearing and in no acute distress. Eyes: Conjunctivae are normal.  Head: Atraumatic. Nose: No congestion/rhinnorhea. Mouth/Throat: Mucous membranes are moist.  Oropharynx non-erythematous. Neck: No stridor.   Cardiovascular: Normal rate, regular rhythm. Grossly normal heart sounds.  Good peripheral circulation. Respiratory: Normal respiratory effort.  No retractions. Lungs CTAB. Gastrointestinal: Soft and nontender. No distention. No abdominal bruits.  Musculoskeletal: No lower extremity tenderness nor edema.  Neurologic:  Normal speech and language. No gross focal neurologic deficits are appreciated.  Skin:  Skin is warm, dry and intact. No rash noted.   ____________________________________________   LABS (all labs ordered are listed, but only abnormal results are displayed)  Labs  Reviewed  COMPREHENSIVE METABOLIC PANEL - Abnormal; Notable for the following components:      Result Value   Calcium 8.8 (*)    All other components within normal limits  CBC WITH DIFFERENTIAL/PLATELET - Abnormal; Notable for the following components:   WBC 3.5 (*)    Platelets 149 (*)    Neutro Abs 1.3 (*)    All other components within normal limits  TROPONIN I (HIGH SENSITIVITY)  TROPONIN I (HIGH SENSITIVITY)   ____________________________________________  EKG  EKG read interpreted by me shows normal sinus rhythm rate of 65 left axis nonspecific ST-T changes ____________________________________________  RADIOLOGY  ED MD interpretation: Chest x-ray read  by radiology reviewed by me does have a vague rounded density in the lateral part of the right upper lobe.  Just could be a shadow of the edge of the scapula on the ribs or something from Covid or something else.  We will check some lab work and see what is going on. Second chest x-ray read by radiology confirms the vague density. Official radiology report(s): Dg Chest Portable 2 Views  Result Date: 10/11/2019 CLINICAL DATA:  Rounded density seen on recent chest x-ray. EXAM: CHEST  2 VIEW PORTABLE COMPARISON:  10/11/2019 FINDINGS: There is a persistent peripheral rounded density in the right upper lung zone overlying the margin of the scapula. There is no pneumothorax. There is atelectasis versus scarring in the lingula and at the left lung base. There are coarse interstitial lung markings bilaterally. IMPRESSION: 1. Persistent small rounded density in the peripheral right upper lung zone. A 4-6 week follow-up two-view chest x-ray is recommended to confirm stability or resolution of this finding. 2. Prominent interstitial lung markings bilaterally which can be seen in patients with an atypical infectious process such as viral pneumonia. Electronically Signed   By: Constance Holster M.D.   On: 10/11/2019 19:09   Dg Chest Portable 1  View  Result Date: 10/11/2019 CLINICAL DATA:  COVID 19 positive.  Cough. EXAM: PORTABLE CHEST 1 VIEW COMPARISON:  March 08, 2017 FINDINGS: The heart, hila, and mediastinum are normal. No pneumothorax. Vague small rounded density projected over the periphery of the right upper lung, not seen on the 2018 study. No other abnormalities seen within the lungs other than scar or atelectasis in the left base. IMPRESSION: 1. Vague, rounded density in the lateral right mid to upper lung may represent sequela of the patient's known COVID-19 diagnosis. Recommend short-term follow-up imaging to ensure resolution. Electronically Signed   By: Dorise Bullion III M.D   On: 10/11/2019 18:23    ____________________________________________   PROCEDURES  Procedure(s) performed (including Critical Care):  Procedures   ____________________________________________   INITIAL IMPRESSION / ASSESSMENT AND PLAN / ED COURSE  [name] was evaluated in Emergency Department on 10/11/2019 for the symptoms described in the history of present illness. She was evaluated in the context of the global COVID-19 pandemic, which necessitated consideration that the patient might be at risk for infection with the SARS-CoV-2 virus that causes COVID-19. Institutional protocols and algorithms that pertain to the evaluation of patients at risk for COVID-19 are in a state of rapid change based on information released by regulatory bodies including the CDC and federal and state organizations. These policies and algorithms were followed during the patient's care in the ED.    Patient's labs are okay.  Patient does have a density on chest x-ray which will need to be followed up in 4 to 6 weeks.  We will let patient go she will return if she is worse or not any better in a week or so.          ____________________________________________   FINAL CLINICAL IMPRESSION(S) / ED DIAGNOSES  Final diagnoses:  Opacity of lung on imaging  study     ED Discharge Orders    None       Note:  This document was prepared using Dragon voice recognition software and may include unintentional dictation errors.    Nena Polio, MD 10/11/19 1945

## 2019-10-11 NOTE — ED Triage Notes (Signed)
Pt arrived via POV states the Health Department advised patient to be re-evaluated because she is feeling more tired. Pt states the health department told her she should be getting better by now.  Pt states she has no new symptoms present. Pt is not short of breath and speaking in complete sentences without difficulty.

## 2019-10-11 NOTE — Telephone Encounter (Addendum)
Patient husband called in to request a order be put in for a chest xray. Patient states that she is having extreme weakness and doesn't want to go to ED for the xray. Patient spoke with the health dept and they advise her to get xray. Called Marlene Bast the on call CMA but no answer and I left a message for her to callback. Spoke with Dr. Raoul Pitch on call provider and she advise that patient will need to go to ED if it is urgent and if she can wait until Monday she will need to contact her PCP and do a virtual visit. Patient verbalized understanding and states that she will go to a urgent care

## 2019-10-11 NOTE — ED Notes (Signed)
Patient is resting comfortably. Pt updated with plan for a 2 view xray. Pt denies needs at this time.

## 2019-10-11 NOTE — Telephone Encounter (Signed)
Noted  

## 2019-10-11 NOTE — Discharge Instructions (Addendum)
Please return for increasing shortness of breath, higher fever or feeling sicker.  Otherwise quarantine yourself till you have been without fever for at least 4 days.  Continue to follow-up for the Covid.  Please have your doctor check on you and repeat the x-ray in 4 to 6 weeks to make sure that that vague density of it turned up on the chest x-ray has resolved.

## 2019-10-11 NOTE — ED Notes (Signed)
First Nurse Note: Pt to ED, Pt is COVID +. States that health department told her to come in and be retested because she is now having fatigue.

## 2019-10-13 ENCOUNTER — Ambulatory Visit (INDEPENDENT_AMBULATORY_CARE_PROVIDER_SITE_OTHER): Payer: Managed Care, Other (non HMO) | Admitting: Internal Medicine

## 2019-10-13 ENCOUNTER — Encounter: Payer: Self-pay | Admitting: Internal Medicine

## 2019-10-13 DIAGNOSIS — R5081 Fever presenting with conditions classified elsewhere: Secondary | ICD-10-CM

## 2019-10-13 DIAGNOSIS — U071 COVID-19: Secondary | ICD-10-CM | POA: Diagnosis not present

## 2019-10-13 DIAGNOSIS — R05 Cough: Secondary | ICD-10-CM

## 2019-10-13 DIAGNOSIS — R5383 Other fatigue: Secondary | ICD-10-CM

## 2019-10-13 DIAGNOSIS — R059 Cough, unspecified: Secondary | ICD-10-CM

## 2019-10-13 NOTE — Patient Instructions (Signed)
COVID-19 Frequently Asked Questions °COVID-19 (coronavirus disease) is an infection that is caused by a large family of viruses. Some viruses cause illness in people and others cause illness in animals like camels, cats, and bats. In some cases, the viruses that cause illness in animals can spread to humans. °Where did the coronavirus come from? °In December 2019, [place] told the World Health Organization (WHO) of several cases of lung disease (human respiratory illness). These cases were linked to an open seafood and livestock market in the city of [place]. The link to the seafood and livestock market suggests that the virus may have spread from animals to humans. However, since that first outbreak in December, the virus has also been shown to spread from person to person. °What is the name of the disease and the virus? °Disease name °Early on, this disease was called novel coronavirus. This is because scientists determined that the disease was caused by a new (novel) respiratory virus. The World Health Organization (WHO) has now named the disease COVID-19, or coronavirus disease. °Virus name °The virus that causes the disease is called severe acute respiratory syndrome coronavirus 2 (SARS-CoV-2). °More information on disease and virus naming °World Health Organization (WHO): www.who.int/emergencies/diseases/novel-coronavirus-2019/technical-guidance/naming-the-coronavirus-disease-(covid-2019)-and-the-virus-that-causes-it °Who is at risk for complications from coronavirus disease? °Some people may be at higher risk for complications from coronavirus disease. This includes older adults and people who have chronic diseases, such as heart disease, diabetes, and lung disease. °If you are at higher risk for complications, take these extra precautions: °· Avoid close contact with people who are sick or have a fever or cough. Stay at least 3-6 ft (1-2 m) away from them, if possible. °· Wash your hands often with soap and  water for at least 20 seconds. °· Avoid touching your face, mouth, nose, or eyes. °· Keep supplies on hand at home, such as food, medicine, and cleaning supplies. °· Stay home as much as possible. °· Avoid social gatherings and travel. °How does coronavirus disease spread? °The virus that causes coronavirus disease spreads easily from person to person (is contagious). There are also cases of community-spread disease. This means the disease has spread to: °· People who have no known contact with other infected people. °· People who have not traveled to areas where there are known cases. °It appears to spread from one person to another through droplets from coughing or sneezing. °Can I get the virus from touching surfaces or objects? °There is still a lot that we do not know about the virus that causes coronavirus disease. Scientists are basing a lot of information on what they know about similar viruses, such as: °· Viruses cannot generally survive on surfaces for long. They need a human body (host) to survive. °· It is more likely that the virus is spread by close contact with people who are sick (direct contact), such as through: °? Shaking hands or hugging. °? Breathing in respiratory droplets that travel through the air. This can happen when an infected person coughs or sneezes on or near other people. °· It is less likely that the virus is spread when a person touches a surface or object that has the virus on it (indirect contact). The virus may be able to enter the body if the person touches a surface or object and then touches his or her face, eyes, nose, or mouth. °Can a person spread the virus without having symptoms of the disease? °It may be possible for the virus to spread before a person   has symptoms of the disease, but this is most likely not the main way the virus is spreading. It is more likely for the virus to spread by being in close contact with people who are sick and breathing in the respiratory  droplets of a sick person's cough or sneeze. °What are the symptoms of coronavirus disease? °Symptoms vary from person to person and can range from mild to severe. Symptoms may include: °· Fever. °· Cough. °· Tiredness, weakness, or fatigue. °· Fast breathing or feeling short of breath. °These symptoms can appear anywhere from 2 to 14 days after you have been exposed to the virus. If you develop symptoms, call your health care provider. People with severe symptoms may need hospital care. °If I am exposed to the virus, how long does it take before symptoms start? °Symptoms of coronavirus disease may appear anywhere from 2 to 14 days after a person has been exposed to the virus. If you develop symptoms, call your health care provider. °Should I be tested for this virus? °Your health care provider will decide whether to test you based on your symptoms, history of exposure, and your risk factors. °How does a health care provider test for this virus? °Health care providers will collect samples to send for testing. Samples may include: °· Taking a swab of fluid from the nose. °· Taking fluid from the lungs by having you cough up mucus (sputum) into a sterile cup. °· Taking a blood sample. °· Taking a stool or urine sample. °Is there a treatment or vaccine for this virus? °Currently, there is no vaccine to prevent coronavirus disease. Also, there are no medicines like antibiotics or antivirals to treat the virus. A person who becomes sick is given supportive care, which means rest and fluids. A person may also relieve his or her symptoms by using over-the-counter medicines that treat sneezing, coughing, and runny nose. These are the same medicines that a person takes for the common cold. °If you develop symptoms, call your health care provider. People with severe symptoms may need hospital care. °What can I do to protect myself and my family from this virus? ° °  ° °You can protect yourself and your family by taking the  same actions that you would take to prevent the spread of other viruses. Take the following actions: °· Wash your hands often with soap and water for at least 20 seconds. If soap and water are not available, use alcohol-based hand sanitizer. °· Avoid touching your face, mouth, nose, or eyes. °· Cough or sneeze into a tissue, sleeve, or elbow. Do not cough or sneeze into your hand or the air. °? If you cough or sneeze into a tissue, throw it away immediately and wash your hands. °· Disinfect objects and surfaces that you frequently touch every day. °· Avoid close contact with people who are sick or have a fever or cough. Stay at least 3-6 ft (1-2 m) away from them, if possible. °· Stay home if you are sick, except to get medical care. Call your health care provider before you get medical care. °· Make sure your vaccines are up to date. Ask your health care provider what vaccines you need. °What should I do if I need to travel? °Follow travel recommendations from your local health authority, the CDC, and WHO. °Travel information and advice °· Centers for Disease Control and Prevention (CDC): www.cdc.gov/coronavirus/2019-ncov/travelers/index.html °· World Health Organization (WHO): www.who.int/emergencies/diseases/novel-coronavirus-2019/travel-advice °Know the risks and take action to protect your health °·   You are at higher risk of getting coronavirus disease if you are traveling to areas with an outbreak or if you are exposed to travelers from areas with an outbreak. °· Wash your hands often and practice good hygiene to lower the risk of catching or spreading the virus. °What should I do if I am sick? °General instructions to stop the spread of infection °· Wash your hands often with soap and water for at least 20 seconds. If soap and water are not available, use alcohol-based hand sanitizer. °· Cough or sneeze into a tissue, sleeve, or elbow. Do not cough or sneeze into your hand or the air. °· If you cough or  sneeze into a tissue, throw it away immediately and wash your hands. °· Stay home unless you must get medical care. Call your health care provider or local health authority before you get medical care. °· Avoid public areas. Do not take public transportation, if possible. °· If you can, wear a mask if you must go out of the house or if you are in close contact with someone who is not sick. °Keep your home clean °· Disinfect objects and surfaces that are frequently touched every day. This may include: °? Counters and tables. °? Doorknobs and light switches. °? Sinks and faucets. °? Electronics such as phones, remote controls, keyboards, computers, and tablets. °· Wash dishes in hot, soapy water or use a dishwasher. Air-dry your dishes. °· Wash laundry in hot water. °Prevent infecting other household members °· Let healthy household members care for children and pets, if possible. If you have to care for children or pets, wash your hands often and wear a mask. °· Sleep in a different bedroom or bed, if possible. °· Do not share personal items, such as razors, toothbrushes, deodorant, combs, brushes, towels, and washcloths. °Where to find more information °Centers for Disease Control and Prevention (CDC) °· Information and news updates: www.cdc.gov/coronavirus/2019-ncov °World Health Organization (WHO) °· Information and news updates: www.who.int/emergencies/diseases/novel-coronavirus-2019 °· Coronavirus health topic: www.who.int/health-topics/coronavirus °· Questions and answers on COVID-19: www.who.int/news-room/q-a-detail/q-a-coronaviruses °· Global tracker: who.sprinklr.com °American Academy of Pediatrics (AAP) °· Information for families: www.healthychildren.org/English/health-issues/conditions/chest-lungs/Pages/2019-Novel-Coronavirus.aspx °The coronavirus situation is changing rapidly. Check your local health authority website or the CDC and WHO websites for updates and news. °When should I contact a health care  provider? °· Contact your health care provider if you have symptoms of an infection, such as fever or cough, and you: °? Have been near anyone who is known to have coronavirus disease. °? Have come into contact with a person who is suspected to have coronavirus disease. °? Have traveled outside of the country. °When should I get emergency medical care? °· Get help right away by calling your local emergency services (911 in the [place] if you have: °? Trouble breathing. °? Pain or pressure in your chest. °? Confusion. °? Blue-tinged lips and fingernails. °? Difficulty waking from sleep. °? Symptoms that get worse. °Let the emergency medical personnel know if you think you have coronavirus disease. °Summary °· A new respiratory virus is spreading from person to person and causing COVID-19 (coronavirus disease). °· The virus that causes COVID-19 appears to spread easily. It spreads from one person to another through droplets from coughing or sneezing. °· Older adults and those with chronic diseases are at higher risk of disease. If you are at higher risk for complications, take extra precautions. °· There is currently no vaccine to prevent coronavirus disease. There are no medicines, such as antibiotics or   antivirals, to treat the virus. °· You can protect yourself and your family by washing your hands often, avoiding touching your face, and covering your coughs and sneezes. °This information is not intended to replace advice given to you by your health care provider. Make sure you discuss any questions you have with your health care provider. °Document Released: 03/25/2019 Document Revised: 03/25/2019 Document Reviewed: 03/25/2019 °Elsevier Patient Education © 2020 [employer] ° °

## 2019-10-13 NOTE — Progress Notes (Signed)
Virtual Visit via Video Note  I connected with [name] on 10/13/19 at  4:00 PM EST by a video enabled telemedicine application and verified that I am speaking with the correct person using two identifiers.  Location: Patient: Home Provider: Office   I discussed the limitations of evaluation and management by telemedicine and the availability of in person appointments. The patient expressed understanding and agreed to proceed.  History of Present Illness:  Pt due for ER follow up. Went to the ER 10/31 with c/o fatigue, fever and cough after being diagnosed with COVID 19 8 days prior. Chest xray was concerning for improving RUQ viral pneumonia. Labs were unremarkable. She was not treated with any medication. She was advised to treat symptoms symptomatically and follow up with PCP. Since discharge, she reports persistent fever, fatigue and intermittent cough. She is taking Tylenol for the headaches but otherwise no medications OTC.    Past Medical History:  Diagnosis Date  . Anxiety   . Depression   . Thyroid disease     Current Outpatient Medications  Medication Sig Dispense Refill  . acyclovir (ZOVIRAX) 200 MG capsule TAKE 1 CAPSULE BY MOUTH  EVERY DAY AS NEEDED 90 capsule 0  . AUTOLOGOUS CULTURE CHONDROCYTE IX Apply to eye.    . busPIRone (BUSPAR) 10 MG tablet Take 1 tablet (10 mg total) by mouth 2 (two) times daily. 180 tablet 1  . Cholecalciferol (VITAMIN D3) 50 MCG (2000 UT) capsule Take by mouth.    . doxycycline (MONODOX) 50 MG capsule 2 (two) times daily.   2  . DUREZOL 0.05 % EMUL Place 1 drop into both eyes 2 (two) times daily.  0  . gabapentin (NEURONTIN) 300 MG capsule Take 1 capsule (300 mg total) by mouth 2 (two) times daily. 180 capsule 1  . ibuprofen (ADVIL,MOTRIN) 200 MG tablet Take 200 mg by mouth every 6 (six) hours as needed for moderate pain.    Marland Kitchen levothyroxine (SYNTHROID, LEVOTHROID) 75 MCG tablet Take 75 mcg by mouth daily before breakfast.    . mirtazapine  (REMERON) 7.5 MG tablet Take 1 tablet (7.5 mg total) by mouth at bedtime. 90 tablet 1  . Multiple Vitamins-Minerals (OCUVITE ADULT 50+ PO) Take by mouth.    . Omega-3 Fatty Acids (FISH OIL) 1000 MG CAPS Take by mouth.    Vladimir Faster Glyc-Propyl Glyc PF (SYSTANE PRESERVATIVE FREE) 0.4-0.3 % SOLN Apply to eye.    . propranolol (INDERAL) 10 MG tablet TAKE 1 TABLET BY MOUTH 3  TIMES DAILY AS NEEDED 270 tablet 3  . solifenacin (VESICARE) 10 MG tablet Take 1 tablet (10 mg total) by mouth daily. 90 tablet 3  . White Petrolatum-Mineral Oil (GENTEAL TEARS NIGHT-TIME) OINT Apply to eye.     No current facility-administered medications for this visit.     Allergies  Allergen Reactions  . Codeine Hives  . Iodinated Diagnostic Agents Itching  . Metrizamide Itching    Family History  Problem Relation Age of Onset  . Cancer Mother        Uterine, Ovarian  . Anxiety disorder Mother   . Depression Mother   . Heart disease Father   . Stroke Father   . Diabetes Father   . Cancer Maternal Grandmother   . ADD / ADHD Daughter   . ADD / ADHD Grandchild   . Prostate cancer Neg Hx   . Kidney cancer Neg Hx   . Breast cancer Neg Hx     Social History  Socioeconomic History  . Marital status: Married    Spouse name: jeff  . Number of children: 2  . Years of education: Not on file  . Highest education level: Master's degree (e.g., MA, MS, MEng, MEd, MSW, MBA)  Occupational History    Comment: retired  Scientific laboratory technician  . Financial resource strain: Not hard at all  . Food insecurity    Worry: Never true    Inability: Never true  . Transportation needs    Medical: No    Non-medical: No  Tobacco Use  . Smoking status: Never Smoker  . Smokeless tobacco: Never Used  Substance and Sexual Activity  . Alcohol use: Yes    Alcohol/week: 1.0 standard drinks    Types: 1 Glasses of wine per week    Comment: 2-3 nights week--1 glass of wine  . Drug use: No  . Sexual activity: Yes    Birth  control/protection: None  Lifestyle  . Physical activity    Days per week: 0 days    Minutes per session: 0 min  . Stress: Very much  Relationships  . Social connections    Talks on phone: More than three times a week    Gets together: Three times a week    Attends religious service: More than 4 times per year    Active member of club or organization: No    Attends meetings of clubs or organizations: Never    Relationship status: Married  . Intimate partner violence    Fear of current or ex partner: No    Emotionally abused: No    Physically abused: No    Forced sexual activity: No  Other Topics Concern  . Not on file  Social History Narrative  . Not on file     Constitutional: Pt reports fatigue, fever. Denies malaise, headache or abrupt weight changes.  HEENT: Denies eye pain, eye redness, ear pain, ringing in the ears, wax buildup, runny nose, nasal congestion, bloody nose, or sore throat. Respiratory: Pt reports cough. Denies difficulty breathing, shortness of breath, or sputum production.   Cardiovascular: Denies chest pain, chest tightness, palpitations or swelling in the hands or feet.    No other specific complaints in a complete review of systems (except as listed in HPI above).  Observations/Objective:  There were no vitals taken for this visit. Wt Readings from Last 3 Encounters:  10/11/19 150 lb (68 kg)  07/14/19 162 lb (73.5 kg)  06/26/19 157 lb (71.2 kg)    General: Appears her stated age, well developed, well nourished in NAD. Skin: Warm, dry and intact. No rashes noted. Pulmonary/Chest: Normal effort. No respiratory distress.  Neurological: Alert and oriented.  BMET    Component Value Date/Time   NA 142 10/11/2019 1815   NA 140 10/23/2016 1323   K 3.6 10/11/2019 1815   CL 107 10/11/2019 1815   CO2 28 10/11/2019 1815   GLUCOSE 97 10/11/2019 1815   BUN 18 10/11/2019 1815   BUN 17 10/23/2016 1323   CREATININE 0.78 10/11/2019 1815   CALCIUM 8.8  (L) 10/11/2019 1815   GFRNONAA >60 10/11/2019 1815   GFRAA >60 10/11/2019 1815    Lipid Panel     Component Value Date/Time   CHOL 264 (H) 10/09/2016 1110   TRIG 79 10/09/2016 1110   HDL 93 10/09/2016 1110   CHOLHDL 2.8 10/09/2016 1110   LDLCALC 155 (H) 10/09/2016 1110    CBC    Component Value Date/Time   WBC 3.5 (  L) 10/11/2019 1815   RBC 4.82 10/11/2019 1815   HGB 14.5 10/11/2019 1815   HGB 13.2 10/23/2016 1323   HCT 44.3 10/11/2019 1815   HCT 40.6 10/23/2016 1323   PLT 149 (L) 10/11/2019 1815   PLT 218 10/23/2016 1323   MCV 91.9 10/11/2019 1815   MCV 92 10/23/2016 1323   MCH 30.1 10/11/2019 1815   MCHC 32.7 10/11/2019 1815   RDW 13.0 10/11/2019 1815   RDW 13.2 10/23/2016 1323   LYMPHSABS 1.9 10/11/2019 1815   MONOABS 0.3 10/11/2019 1815   EOSABS 0.0 10/11/2019 1815   BASOSABS 0.0 10/11/2019 1815    Hgb A1C No results found for: HGBA1C      Assessment and Plan:  ER Follow Up for COVID 19:  ER notes, labs and imaging reviewed She is stable at this time, continue supportive care Recommend self quarantine, social distancing masking and frequent handwashing ER precautions discussed Will plan to follow up chest xray 11/19  Follow Up Instructions:    I discussed the assessment and treatment plan with the patient. The patient was provided an opportunity to ask questions and all were answered. The patient agreed with the plan and demonstrated an understanding of the instructions.   The patient was advised to call back or seek an in-person evaluation if the symptoms worsen or if the condition fails to improve as anticipated.     Webb Silversmith, NP

## 2019-10-20 ENCOUNTER — Encounter: Payer: Managed Care, Other (non HMO) | Admitting: Internal Medicine

## 2019-10-30 ENCOUNTER — Ambulatory Visit (INDEPENDENT_AMBULATORY_CARE_PROVIDER_SITE_OTHER)
Admission: RE | Admit: 2019-10-30 | Discharge: 2019-10-30 | Disposition: A | Discharge status: Home / Self Care | Payer: Managed Care, Other (non HMO) | Source: Ambulatory Visit | Attending: Internal Medicine | Admitting: Internal Medicine

## 2019-10-30 ENCOUNTER — Encounter: Payer: Self-pay | Admitting: Internal Medicine

## 2019-10-30 ENCOUNTER — Other Ambulatory Visit: Payer: Self-pay

## 2019-10-30 ENCOUNTER — Ambulatory Visit (INDEPENDENT_AMBULATORY_CARE_PROVIDER_SITE_OTHER): Payer: Managed Care, Other (non HMO) | Admitting: Internal Medicine

## 2019-10-30 VITALS — BP 110/74 | HR 68 | Temp 97.4°F | Ht 65.5 in | Wt 163.0 lb

## 2019-10-30 DIAGNOSIS — F419 Anxiety disorder, unspecified: Secondary | ICD-10-CM | POA: Diagnosis not present

## 2019-10-30 DIAGNOSIS — E559 Vitamin D deficiency, unspecified: Secondary | ICD-10-CM

## 2019-10-30 DIAGNOSIS — F329 Major depressive disorder, single episode, unspecified: Secondary | ICD-10-CM

## 2019-10-30 DIAGNOSIS — Z23 Encounter for immunization: Secondary | ICD-10-CM

## 2019-10-30 DIAGNOSIS — J129 Viral pneumonia, unspecified: Secondary | ICD-10-CM | POA: Diagnosis not present

## 2019-10-30 DIAGNOSIS — Z0000 Encounter for general adult medical examination without abnormal findings: Secondary | ICD-10-CM | POA: Diagnosis not present

## 2019-10-30 DIAGNOSIS — E039 Hypothyroidism, unspecified: Secondary | ICD-10-CM

## 2019-10-30 DIAGNOSIS — G629 Polyneuropathy, unspecified: Secondary | ICD-10-CM

## 2019-10-30 DIAGNOSIS — F32A Depression, unspecified: Secondary | ICD-10-CM

## 2019-10-30 DIAGNOSIS — N3281 Overactive bladder: Secondary | ICD-10-CM | POA: Diagnosis not present

## 2019-10-30 NOTE — Assessment & Plan Note (Addendum)
Continue taking Levothyroxine 75 mcg as prescribed Will check TSH and free T4 Will monitor

## 2019-10-30 NOTE — Assessment & Plan Note (Addendum)
Continue Vesicare 10 mg  Will monitor

## 2019-10-30 NOTE — Progress Notes (Signed)
Subjective:    Patient ID: [name], female    DOB: 1951-11-08, 68 y.o.   MRN: [id number]  HPI  Pt presents to the clinic today for her annual exam. She is also due to follow up chronic conditions.   Hypothyrodism: Last TSH was 4.580, Free T4 was 0.97 on 03/2018. She is taking Levothyroxine 75 mcg as prescribed. Follows with Dr. Gabriel Carina from endocrinology.   OAB: Managed on Vesicare. Follows with urology yearly.  Anxiety/Depression: Triggered by mother and father being chronically ill and passing. Takes Buspar 10mg  and Mirtazapine 7.5 mg for sleep. She sees Dr Shea Evans for her anxiety and last visit was 2 months ago.   She is also due to follow up chest xray for evaluation of resolution of viral pneumonia.  Flu: 08/2019 Tetanus: 09/2016 Pneumovax: never Prevnar:  never Zostovax: 11/2016 Shingrix: everinterested Pap Smear: Hysterectomy Mammogram: 12/2018, schedule for 12/2019 Bone Density: 11/2016 Colon Screening: 05/2016 Vision Screening: Annually Dentist: Biannually   Diet: She eats meats and mostly seafood. She consumes fruits and veggies daily. She rarely eats fried foods. She drinks mostly water and occasionally Sprite and decaf tea.  Exercise: She tries to walk 10,000 steps a day and does some yard work.   Review of Systems  Past Medical History:  Diagnosis Date  . Anxiety   . Depression   . Thyroid disease     Current Outpatient Medications  Medication Sig Dispense Refill  . acyclovir (ZOVIRAX) 200 MG capsule TAKE 1 CAPSULE BY MOUTH  EVERY DAY AS NEEDED 90 capsule 0  . AUTOLOGOUS CULTURE CHONDROCYTE IX Apply to eye.    . busPIRone (BUSPAR) 10 MG tablet Take 1 tablet (10 mg total) by mouth 2 (two) times daily. 180 tablet 1  . Cholecalciferol (VITAMIN D3) 50 MCG (2000 UT) capsule Take by mouth.    . doxycycline (MONODOX) 50 MG capsule 2 (two) times daily.   2  . DUREZOL 0.05 % EMUL Place 1 drop into both eyes 2 (two) times daily.  0  . gabapentin (NEURONTIN) 300 MG  capsule Take 1 capsule (300 mg total) by mouth 2 (two) times daily. 180 capsule 1  . ibuprofen (ADVIL,MOTRIN) 200 MG tablet Take 200 mg by mouth every 6 (six) hours as needed for moderate pain.    Marland Kitchen levothyroxine (SYNTHROID, LEVOTHROID) 75 MCG tablet Take 75 mcg by mouth daily before breakfast.    . mirtazapine (REMERON) 7.5 MG tablet Take 1 tablet (7.5 mg total) by mouth at bedtime. 90 tablet 1  . Multiple Vitamins-Minerals (OCUVITE ADULT 50+ PO) Take by mouth.    . Omega-3 Fatty Acids (FISH OIL) 1000 MG CAPS Take by mouth.    Vladimir Faster Glyc-Propyl Glyc PF (SYSTANE PRESERVATIVE FREE) 0.4-0.3 % SOLN Apply to eye.    . propranolol (INDERAL) 10 MG tablet TAKE 1 TABLET BY MOUTH 3  TIMES DAILY AS NEEDED 270 tablet 3  . solifenacin (VESICARE) 10 MG tablet Take 1 tablet (10 mg total) by mouth daily. 90 tablet 3  . White Petrolatum-Mineral Oil (GENTEAL TEARS NIGHT-TIME) OINT Apply to eye.     No current facility-administered medications for this visit.     Allergies  Allergen Reactions  . Codeine Hives  . Iodinated Diagnostic Agents Itching  . Metrizamide Itching    Family History  Problem Relation Age of Onset  . Cancer Mother        Uterine, Ovarian  . Anxiety disorder Mother   . Depression Mother   . Heart disease  Father   . Stroke Father   . Diabetes Father   . Cancer Maternal Grandmother   . ADD / ADHD Daughter   . ADD / ADHD Grandchild   . Prostate cancer Neg Hx   . Kidney cancer Neg Hx   . Breast cancer Neg Hx     Social History   Socioeconomic History  . Marital status: Married    Spouse name: jeff  . Number of children: 2  . Years of education: Not on file  . Highest education level: Master's degree (e.g., MA, MS, MEng, MEd, MSW, MBA)  Occupational History    Comment: retired  Scientific laboratory technician  . Financial resource strain: Not hard at all  . Food insecurity    Worry: Never true    Inability: Never true  . Transportation needs    Medical: No    Non-medical: No   Tobacco Use  . Smoking status: Never Smoker  . Smokeless tobacco: Never Used  Substance and Sexual Activity  . Alcohol use: Yes    Alcohol/week: 1.0 standard drinks    Types: 1 Glasses of wine per week    Comment: 2-3 nights week--1 glass of wine  . Drug use: No  . Sexual activity: Yes    Birth control/protection: None  Lifestyle  . Physical activity    Days per week: 0 days    Minutes per session: 0 min  . Stress: Very much  Relationships  . Social connections    Talks on phone: More than three times a week    Gets together: Three times a week    Attends religious service: More than 4 times per year    Active member of club or organization: No    Attends meetings of clubs or organizations: Never    Relationship status: Married  . Intimate partner violence    Fear of current or ex partner: No    Emotionally abused: No    Physically abused: No    Forced sexual activity: No  Other Topics Concern  . Not on file  Social History Narrative  . Not on file     Constitutional: Denies fever, malaise, fatigue, headache or abrupt weight changes.  HEENT: Denies eye pain, eye redness, ear pain, ringing in the ears, wax buildup, runny nose, nasal congestion, bloody nose, or sore throat. Respiratory: Denies difficulty breathing, shortness of breath, cough or sputum production.   Cardiovascular: Denies chest pain, chest tightness, palpitations or swelling in the hands or feet.  Gastrointestinal: Denies abdominal pain, bloating, constipation, diarrhea or blood in the stool.  GU: Pt reports overactive bladder. Denies urgency, frequency, pain with urination, burning sensation, blood in urine, odor or discharge. Musculoskeletal: Denies decrease in range of motion, difficulty with gait, muscle pain or joint pain and swelling.  Skin: Denies redness, rashes, lesions or ulcercations.  Neurological: Pt reports neuropathy in legs. Denies dizziness, difficulty with memory, difficulty with speech or  problems with balance and coordination.  Psych: Pt reports anxiety and depression. Denies SI/HI.  No other specific complaints in a complete review of systems (except as listed in HPI above).     Objective:   Physical Exam  BP 110/74   Pulse 68   Temp (!) 97.4 F (36.3 C) (Temporal)   Ht 5' 5.5" (1.664 m)   Wt 163 lb (73.9 kg)   SpO2 98%   BMI 26.71 kg/m   Wt Readings from Last 3 Encounters:  10/11/19 150 lb (68 kg)  07/14/19 162  lb (73.5 kg)  06/26/19 157 lb (71.2 kg)    General: Appears her stated age, well developed in NAD. Skin: Warm, dry and intact. No rashes, lesions or ulcerations noted. HEENT: Head: normal shape and size; Eyes: sclera white, no icterus, conjunctiva pink and EOMs intact.  Neck:  Neck supple, trachea midline. No masses, lumps or thyromegaly present.  Cardiovascular: Normal rate and rhythm. S1,S2 noted.  No murmur, rubs or gallops noted. No JVD or BLE edema. No carotid bruits noted. Pulmonary/Chest: Normal effort and positive vesicular breath sounds. No respiratory distress. No wheezes, rales or ronchi noted.  Abdomen: Soft and nontender. Normal bowel sounds. No distention or masses noted. Liver, spleen and kidneys non palpable. Musculoskeletal: Strength 5/5 BUE/BLE. No difficulty with gait.  Neurological: Alert and oriented. Coordination normal.  Psychiatric: Mood and affect normal. Behavior is normal. Judgment and thought content normal.     BMET    Component Value Date/Time   NA 142 10/11/2019 1815   NA 140 10/23/2016 1323   K 3.6 10/11/2019 1815   CL 107 10/11/2019 1815   CO2 28 10/11/2019 1815   GLUCOSE 97 10/11/2019 1815   BUN 18 10/11/2019 1815   BUN 17 10/23/2016 1323   CREATININE 0.78 10/11/2019 1815   CALCIUM 8.8 (L) 10/11/2019 1815   GFRNONAA >60 10/11/2019 1815   GFRAA >60 10/11/2019 1815    Lipid Panel     Component Value Date/Time   CHOL 264 (H) 10/09/2016 1110   TRIG 79 10/09/2016 1110   HDL 93 10/09/2016 1110   CHOLHDL  2.8 10/09/2016 1110   LDLCALC 155 (H) 10/09/2016 1110    CBC    Component Value Date/Time   WBC 3.5 (L) 10/11/2019 1815   RBC 4.82 10/11/2019 1815   HGB 14.5 10/11/2019 1815   HGB 13.2 10/23/2016 1323   HCT 44.3 10/11/2019 1815   HCT 40.6 10/23/2016 1323   PLT 149 (L) 10/11/2019 1815   PLT 218 10/23/2016 1323   MCV 91.9 10/11/2019 1815   MCV 92 10/23/2016 1323   MCH 30.1 10/11/2019 1815   MCHC 32.7 10/11/2019 1815   RDW 13.0 10/11/2019 1815   RDW 13.2 10/23/2016 1323   LYMPHSABS 1.9 10/11/2019 1815   MONOABS 0.3 10/11/2019 1815   EOSABS 0.0 10/11/2019 1815   BASOSABS 0.0 10/11/2019 1815    Hgb A1C No results found for: HGBA1C         Assessment & Plan:  Preventative Health Maintenance:   Flu shot UTD Tetanus: UTD Prevnar today Pneumovax in 1 year Zostavax: UTD Shingrix: interested, will check with insurance Pap Smear: Hesterectomy Mammogram: Scheduled for 12/2019 Bone Density UTD Colon Screening: UTD Encouraged to eat a healthy and balanced diet Encouraged patient to see a dentist and vision checked annually Ordered CBC, CMET, TSH, Free T3/T4, Vit D and lipid panel.   Abnormal Chest Xray:  Repeat chest xray today  Neuropathy:  Will check Vit D and B12 today  RTC in 1 year, sooner if needed Webb Silversmith, NP

## 2019-10-30 NOTE — Assessment & Plan Note (Addendum)
Continue taking Buspar 10mg  and Mirtazpine 7.5 mg as prescribed Continue appointments with Dr Shea Evans Will monitor

## 2019-10-31 ENCOUNTER — Encounter: Payer: Self-pay | Admitting: Internal Medicine

## 2019-10-31 LAB — COMPREHENSIVE METABOLIC PANEL
ALT: 32 IU/L (ref 0–32)
AST: 30 IU/L (ref 0–40)
Albumin/Globulin Ratio: 2 (ref 1.2–2.2)
Albumin: 4.2 g/dL (ref 3.8–4.8)
Alkaline Phosphatase: 98 IU/L (ref 39–117)
BUN/Creatinine Ratio: 18 (ref 12–28)
BUN: 14 mg/dL (ref 8–27)
Bilirubin Total: 0.3 mg/dL (ref 0.0–1.2)
CO2: 26 mmol/L (ref 20–29)
Calcium: 9.5 mg/dL (ref 8.7–10.3)
Chloride: 106 mmol/L (ref 96–106)
Creatinine, Ser: 0.79 mg/dL (ref 0.57–1.00)
GFR calc Af Amer: 90 mL/min/{1.73_m2} (ref >59)
GFR calc non Af Amer: 78 mL/min/{1.73_m2} (ref >59)
Globulin, Total: 2.1 g/dL (ref 1.5–4.5)
Glucose: 96 mg/dL (ref 65–99)
Potassium: 4.4 mmol/L (ref 3.5–5.2)
Sodium: 142 mmol/L (ref 134–144)
Total Protein: 6.3 g/dL (ref 6.0–8.5)

## 2019-10-31 LAB — LIPID PANEL
Chol/HDL Ratio: 4 ratio (ref 0.0–4.4)
Cholesterol, Total: 230 mg/dL — ABNORMAL HIGH (ref 100–199)
HDL: 57 mg/dL (ref >39)
LDL Chol Calc (NIH): 150 mg/dL — ABNORMAL HIGH (ref 0–99)
Triglycerides: 129 mg/dL (ref 0–149)
VLDL Cholesterol Cal: 23 mg/dL (ref 5–40)

## 2019-10-31 LAB — CBC
Hematocrit: 41.6 % (ref 34.0–46.6)
Hemoglobin: 14.1 g/dL (ref 11.1–15.9)
MCH: 30.5 pg (ref 26.6–33.0)
MCHC: 33.9 g/dL (ref 31.5–35.7)
MCV: 90 fL (ref 79–97)
Platelets: 173 10*3/uL (ref 150–450)
RBC: 4.62 x10E6/uL (ref 3.77–5.28)
RDW: 12.8 % (ref 11.7–15.4)
WBC: 6.5 10*3/uL (ref 3.4–10.8)

## 2019-10-31 LAB — TSH: TSH: 1.44 u[IU]/mL (ref 0.450–4.500)

## 2019-10-31 LAB — T4, FREE: Free T4: 1.26 ng/dL (ref 0.82–1.77)

## 2019-10-31 LAB — VITAMIN B12: Vitamin B-12: 526 pg/mL (ref 232–1245)

## 2019-10-31 LAB — VITAMIN D 25 HYDROXY (VIT D DEFICIENCY, FRACTURES): Vit D, 25-Hydroxy: 44.8 ng/mL (ref 30.0–100.0)

## 2019-10-31 NOTE — Addendum Note (Signed)
Addended by: Lurlean Nanny on: 10/31/2019 11:48 AM   Modules accepted: Orders

## 2019-10-31 NOTE — Addendum Note (Signed)
Addended by: Lurlean Nanny on: 10/31/2019 11:54 AM   Modules accepted: Orders

## 2019-10-31 NOTE — Patient Instructions (Signed)

## 2019-11-26 ENCOUNTER — Other Ambulatory Visit: Payer: Self-pay | Admitting: Internal Medicine

## 2019-11-26 DIAGNOSIS — Z1231 Encounter for screening mammogram for malignant neoplasm of breast: Secondary | ICD-10-CM

## 2019-12-29 ENCOUNTER — Other Ambulatory Visit: Payer: Self-pay | Admitting: Internal Medicine

## 2019-12-29 MED ORDER — ACYCLOVIR 200 MG PO CAPS: 200.0000 mg | ORAL_CAPSULE | Freq: Every day | ORAL | 0 refills | Status: DC

## 2019-12-31 ENCOUNTER — Ambulatory Visit (INDEPENDENT_AMBULATORY_CARE_PROVIDER_SITE_OTHER): Payer: Managed Care, Other (non HMO)

## 2019-12-31 ENCOUNTER — Ambulatory Visit
Admission: RE | Admit: 2019-12-31 | Discharge: 2019-12-31 | Disposition: A | Discharge status: Home / Self Care | Payer: Managed Care, Other (non HMO) | Source: Ambulatory Visit | Attending: Internal Medicine | Admitting: Internal Medicine

## 2019-12-31 ENCOUNTER — Other Ambulatory Visit: Payer: Self-pay

## 2019-12-31 ENCOUNTER — Encounter: Payer: Self-pay | Admitting: Internal Medicine

## 2019-12-31 DIAGNOSIS — Z1231 Encounter for screening mammogram for malignant neoplasm of breast: Secondary | ICD-10-CM | POA: Diagnosis present

## 2019-12-31 DIAGNOSIS — Z23 Encounter for immunization: Secondary | ICD-10-CM | POA: Diagnosis not present

## 2020-01-19 ENCOUNTER — Ambulatory Visit: Payer: Managed Care, Other (non HMO) | Attending: Internal Medicine

## 2020-01-19 DIAGNOSIS — Z23 Encounter for immunization: Secondary | ICD-10-CM | POA: Insufficient documentation

## 2020-01-19 NOTE — Progress Notes (Signed)
   Covid-19 Vaccination Clinic  Name:  [name]    MRN: [id number] DOB: December 23, 1950  01/19/2020  [name] was observed post Covid-19 immunization for 15 minutes without incidence. She was provided with Vaccine Information Sheet and instruction to access the V-Safe system.   [name] was instructed to call 911 with any severe reactions post vaccine: Marland Kitchen Difficulty breathing  . Swelling of your face and throat  . A fast heartbeat  . A bad rash all over your body  . Dizziness and weakness    Immunizations Administered    Name Date Dose VIS Date Route   Pfizer COVID-19 Vaccine 01/19/2020  4:06 PM 0.3 mL 11/21/2019 Intramuscular   Manufacturer: [place]   Lot: [id number]   [place]: [id number]

## 2020-02-11 ENCOUNTER — Encounter: Payer: Self-pay | Admitting: Psychiatry

## 2020-02-11 ENCOUNTER — Other Ambulatory Visit: Payer: Self-pay

## 2020-02-11 ENCOUNTER — Ambulatory Visit (INDEPENDENT_AMBULATORY_CARE_PROVIDER_SITE_OTHER): Payer: 59 | Admitting: Psychiatry

## 2020-02-11 DIAGNOSIS — F411 Generalized anxiety disorder: Secondary | ICD-10-CM

## 2020-02-11 DIAGNOSIS — F4312 Post-traumatic stress disorder, chronic: Secondary | ICD-10-CM | POA: Diagnosis not present

## 2020-02-11 MED ORDER — GABAPENTIN 300 MG PO CAPS: 300.0000 mg | ORAL_CAPSULE | Freq: Two times a day (BID) | ORAL | 1 refills | Status: DC

## 2020-02-11 MED ORDER — MIRTAZAPINE 7.5 MG PO TABS: 7.5000 mg | ORAL_TABLET | Freq: Every day | ORAL | 1 refills | Status: DC

## 2020-02-11 MED ORDER — BUSPIRONE HCL 10 MG PO TABS: 10.0000 mg | ORAL_TABLET | Freq: Two times a day (BID) | ORAL | 1 refills | Status: DC

## 2020-02-11 NOTE — Progress Notes (Signed)
Provider Location : [facility] Patient Location : [facility]/textile technologist Visit via Video Note  I connected with [name] on 02/11/20 at  2:00 PM EST by a video enabled telemedicine application and verified that I am speaking with the correct person using two identifiers.   I discussed the limitations of evaluation and management by telemedicine and the availability of in person appointments. The patient expressed understanding and agreed to proceed.     I discussed the assessment and treatment plan with the patient. The patient was provided an opportunity to ask questions and all were answered. The patient agreed with the plan and demonstrated an understanding of the instructions.   The patient was advised to call back or seek an in-person evaluation if the symptoms worsen or if the condition fails to improve as anticipated.  [facility] MD OP Progress Note  02/11/2020 5:36 PM [name]  MRN:  [id number]  Chief Complaint:  Chief Complaint    Follow-up     HPI: [name] is a 69 year old Caucasian female, retired, married, lives in [place], has a history of PTSD, GAD was evaluated by telemedicine today.  A video call was initiated however due to connection problem it had to be changed to a phone call during the session.  Patient today reports she is currently doing well with regards to her anxiety symptoms.  Patient denies any suicidality, homicidality or perceptual disturbances.  She reports she got infected with COVID-19 in October.  She reports ever since then she has been struggling with a frontal headache at least couple of times a week.  Patient reports she is able to cope with it.  Encouraged patient to reach out to her primary care provider if it continues to be a problem.  Patient reports she would like to reduce the dosage of BuSpar if possible since her anxiety symptoms are stable.  Patient is compliant on her other medications as prescribed.  Sleep continues to be good on the  Remeron.  She continues to follow-up with therapist [name] and reports therapy sessions is beneficial.  It has been a long time since her father passed away and all her stressors due to that have resolved.  She reports she is at a better place at this time.  Patient denies any other concerns today. Visit Diagnosis:    ICD-10-CM   1. GAD (generalized anxiety disorder)  F41.1 mirtazapine (REMERON) 7.5 MG tablet    busPIRone (BUSPAR) 10 MG tablet    gabapentin (NEURONTIN) 300 MG capsule  2. Chronic post-traumatic stress disorder (PTSD)  F43.12     Past Psychiatric History: I have reviewed past psychiatric history from my progress note on 03/11/2018  Past Medical History:  Past Medical History:  Diagnosis Date  . Anxiety   . Depression   . Thyroid disease     Past Surgical History:  Procedure Laterality Date  . ABDOMINAL HYSTERECTOMY  1992   Total  . BREAST BIOPSY Right 07/20/2015   neg fat necrosis  . BREAST EXCISIONAL BIOPSY Right 1960's   negative. No scar seen  . CERVICAL FUSION  2005,2010   C2-C5  . REDUCTION MAMMAPLASTY Bilateral 2016  . TONSILLECTOMY  1961    Family Psychiatric History: I have reviewed family psychiatric history from my progress note on 03/11/2018  Family History:  Family History  Problem Relation Age of Onset  . Cancer Mother        Uterine, Ovarian  . Anxiety disorder Mother   . Depression Mother   .  Heart disease Father   . Stroke Father   . Diabetes Father   . Cancer Maternal Grandmother   . ADD / ADHD Daughter   . ADD / ADHD Grandchild   . Prostate cancer Neg Hx   . Kidney cancer Neg Hx   . Breast cancer Neg Hx     Social History: I have reviewed social history from my progress note on 03/11/2018 Social History   Socioeconomic History  . Marital status: Married    Spouse name: jeff  . Number of children: 2  . Years of education: Not on file  . Highest education level: Master's degree (e.g., MA, MS, MEng, MEd, MSW, MBA)   Occupational History    Comment: retired  Tobacco Use  . Smoking status: Never Smoker  . Smokeless tobacco: Never Used  Substance and Sexual Activity  . Alcohol use: Yes    Alcohol/week: 1.0 standard drinks    Types: 1 Glasses of wine per week    Comment: 2-3 nights week--1 glass of wine  . Drug use: No  . Sexual activity: Yes    Birth control/protection: None  Other Topics Concern  . Not on file  Social History Narrative  . Not on file   Social Determinants of Health   Financial Resource Strain:   . Difficulty of Paying Living Expenses: Not on file  Food Insecurity:   . Worried About Charity fundraiser in the Last Year: Not on file  . Ran Out of Food in the Last Year: Not on file  Transportation Needs:   . Lack of Transportation (Medical): Not on file  . Lack of Transportation (Non-Medical): Not on file  Physical Activity:   . Days of Exercise per Week: Not on file  . Minutes of Exercise per Session: Not on file  Stress:   . Feeling of Stress : Not on file  Social Connections:   . Frequency of Communication with Friends and Family: Not on file  . Frequency of Social Gatherings with Friends and Family: Not on file  . Attends Religious Services: Not on file  . Active Member of Clubs or Organizations: Not on file  . Attends Archivist Meetings: Not on file  . Marital Status: Not on file    Allergies:  Allergies  Allergen Reactions  . Codeine Hives  . Iodinated Diagnostic Agents Itching  . Metrizamide Itching    Metabolic Disorder Labs: No results found for: HGBA1C, MPG No results found for: PROLACTIN Lab Results  Component Value Date   CHOL 230 (H) 10/30/2019   TRIG 129 10/30/2019   HDL 57 10/30/2019   CHOLHDL 4.0 10/30/2019   LDLCALC 150 (H) 10/30/2019   LDLCALC 155 (H) 10/09/2016   Lab Results  Component Value Date   TSH 1.440 10/30/2019   TSH 4.580 (H) 03/11/2018    Therapeutic Level Labs: No results found for: LITHIUM No results  found for: VALPROATE No components found for:  CBMZ  Current Medications: Current Outpatient Medications  Medication Sig Dispense Refill  . cycloSPORINE, PF, (CEQUA) 0.09 % SOLN Apply to eye.    Marland Kitchen acyclovir (ZOVIRAX) 200 MG capsule Take 1 capsule (200 mg total) by mouth daily. 90 capsule 0  . Ascorbic Acid (VITAMIN C) 1000 MG tablet Take 1,000 mg by mouth daily.    . Biotin (BIOTIN 5000) 5 MG CAPS Take by mouth.    . busPIRone (BUSPAR) 10 MG tablet Take 1 tablet (10 mg total) by mouth 2 (two) times  daily. 180 tablet 1  . CEQUA 0.09 % SOLN Apply 1 drop to eye 2 (two) times daily.    . Cholecalciferol (VITAMIN D3) 50 MCG (2000 UT) capsule Take by mouth.    . Diclofenac Sodium 3 % GEL diclofenac 3 % topical gel  APPLY TO LESION AREAS BY TOPICAL ROUTE 2 TIMES PER DAY    . doxycycline (MONODOX) 50 MG capsule 2 (two) times daily.   2  . gabapentin (NEURONTIN) 300 MG capsule Take 1 capsule (300 mg total) by mouth 2 (two) times daily. 180 capsule 1  . ibuprofen (ADVIL,MOTRIN) 200 MG tablet Take 200 mg by mouth every 6 (six) hours as needed for moderate pain.    Marland Kitchen levothyroxine (SYNTHROID, LEVOTHROID) 75 MCG tablet Take 75 mcg by mouth daily before breakfast.    . mirtazapine (REMERON) 7.5 MG tablet Take 1 tablet (7.5 mg total) by mouth at bedtime. 90 tablet 1  . Multiple Vitamins-Minerals (OCUVITE ADULT 50+ PO) Take by mouth.    . Omega-3 Fatty Acids (FISH OIL) 1000 MG CAPS Take by mouth.    . oxyCODONE (OXY IR/ROXICODONE) 5 MG immediate release tablet oxycodone 5 mg tablet    . propranolol (INDERAL) 10 MG tablet TAKE 1 TABLET BY MOUTH 3  TIMES DAILY AS NEEDED 270 tablet 3  . solifenacin (VESICARE) 10 MG tablet Take 1 tablet (10 mg total) by mouth daily. 90 tablet 3  . White Petrolatum-Mineral Oil (GENTEAL TEARS NIGHT-TIME) OINT Apply to eye.     No current facility-administered medications for this visit.     Musculoskeletal: Strength & Muscle Tone: UTA Gait & Station: normal Patient  leans: N/A  Psychiatric Specialty Exam: Review of Systems  Neurological: Positive for headaches.  Psychiatric/Behavioral: The patient is nervous/anxious.   All other systems reviewed and are negative.   There were no vitals taken for this visit.There is no height or weight on file to calculate BMI.  General Appearance: Casual  Eye Contact:  Fair  Speech:  Clear and Coherent  Volume:  Normal   Mood: anxious on and off - coping OK  Affect:  Congruent  Thought Process:  Goal Directed and Descriptions of Associations: Intact  Orientation:  Full (Time, Place, and Person)  Thought Content: Logical   Suicidal Thoughts:  No  Homicidal Thoughts:  No  Memory:  Immediate;   Fair Recent;   Fair Remote;   Fair  Judgement:  Fair  Insight:  Fair  Psychomotor Activity:  Normal  Concentration:  Concentration: Fair and Attention Span: Fair  Recall:  [employer] of Knowledge: Fair  Language: Fair  Akathisia:  No  Handed:  Right  AIMS (if indicated): UTA  Assets:  Communication Skills Desire for Improvement Housing Social Support  ADL's:  Intact  Cognition: WNL  Sleep:  Fair   Screenings: GAD-7     Office Visit from 08/14/2019 in [place]  Total GAD-7 Score  5    PHQ2-9     Office Visit from 10/30/2019 in [place] at [facility] Visit from 10/09/2016 in [place] at [facility] Visit from 04/18/2016 in [place] at [facility] Visit from 07/30/2014 in [place] at [facility] Total Score  0  0  0  0       Assessment and Plan: Tanisi is a 69 year old Caucasian female who is married, retired, lives in [place], has a history of PTSD, anxiety disorder, thyroid problems was evaluated by telemedicine  today.  Patient is currently stable on current medication regimen.  Plan as noted below.  Plan GAD-stable Gabapentin 300 mg p.o. 3 times daily BuSpar 10 mg p.o. twice daily-patient advised to  start taking BuSpar 10 mg p.o. daily if she is able to tolerate it without worsening anxiety symptoms. Remeron 7.5 mg p.o. nightly Propranolol 10 mg p.o. 3 times daily as needed  PTSD-stable Continue CBT with [name] as needed  Insomnia-stable Mirtazapine 7.5 mg p.o. nightly  Follow-up in clinic in 5 to 6 months or sooner if needed.  I have spent atleast 20 minutes non face to face with patient today. More than 50 % of the time was spent for ordering medications and test ,psychoeducation and supportive psychotherapy and care coordination,as well as documenting clinical information in electronic health record. This note was generated in part or whole with voice recognition software. Voice recognition is usually quite accurate but there are transcription errors that can and very often do occur. I apologize for any typographical errors that were not detected and corrected.       Ursula Alert, MD 02/11/2020, 5:36 PM

## 2020-02-13 ENCOUNTER — Ambulatory Visit: Payer: Managed Care, Other (non HMO) | Attending: Internal Medicine

## 2020-02-13 DIAGNOSIS — Z23 Encounter for immunization: Secondary | ICD-10-CM | POA: Insufficient documentation

## 2020-02-13 NOTE — Progress Notes (Signed)
   Covid-19 Vaccination Clinic  Name:  [name]    MRN: [id number] DOB: Apr 17, 1951  02/13/2020  [name] was observed post Covid-19 immunization for 15 minutes without incident. She was provided with Vaccine Information Sheet and instruction to access the V-Safe system.   [name] was instructed to call 911 with any severe reactions post vaccine: Marland Kitchen Difficulty breathing  . Swelling of face and throat  . A fast heartbeat  . A bad rash all over body  . Dizziness and weakness   Immunizations Administered    Name Date Dose VIS Date Route   Pfizer COVID-19 Vaccine 02/13/2020  1:01 PM 0.3 mL 11/21/2019 Intramuscular   Manufacturer: [place]   Lot: [id number]   [place]: [id number]

## 2020-03-30 ENCOUNTER — Ambulatory Visit (INDEPENDENT_AMBULATORY_CARE_PROVIDER_SITE_OTHER): Payer: Self-pay | Admitting: Dermatology

## 2020-03-30 ENCOUNTER — Other Ambulatory Visit: Payer: Self-pay

## 2020-03-30 DIAGNOSIS — L988 Other specified disorders of the skin and subcutaneous tissue: Secondary | ICD-10-CM

## 2020-03-30 NOTE — Progress Notes (Signed)
   Follow-Up Visit   Subjective  [name] is a 69 y.o. female who presents for the following: Facial Elastosis (face, pt presents for Botox today, last txt 12/30/2019).   The following portions of the chart were reviewed this encounter and updated as appropriate:  Tobacco  Allergies  Meds  Problems  Med Hx  Surg Hx  Fam Hx      Review of Systems:  No other skin or systemic complaints except as noted in HPI or Assessment and Plan.  Objective  Well appearing patient in no apparent distress; mood and affect are within normal limits.  A focused examination was performed including face. Relevant physical exam findings are noted in the Assessment and Plan.  Objective  face: Rhytides and volume loss.   Images                   Assessment & Plan  Elastosis of skin face  Botox 20 units injected today: -Frown complex  Botox Injection - face Location: Frown complex  Informed consent: Discussed risks (infection, pain, bleeding, bruising, swelling, allergic reaction, paralysis of nearby muscles, eyelid droop, double vision, neck weakness, difficulty breathing, headache, undesirable cosmetic result, and need for additional treatment) and benefits of the procedure, as well as the alternatives.  Informed consent was obtained.  Preparation: The area was cleansed with alcohol.  Procedure Details:  Botox was injected into the dermis with a 30-gauge needle. Pressure applied to any bleeding. Ice packs offered for swelling.  Lot Number:  [id number] Expiration:  06/2022  Total Units Injected:  20  Plan: Patient was instructed to remain upright for 4 hours. Patient was instructed to avoid massaging the face and avoid vigorous exercise for the rest of the day. Tylenol may be used for headache.  Allow 2 weeks before returning to clinic for additional dosing as needed. Patient will call for any problems.   Return in about 3 months (around 06/29/2020) for Botox.     Documentation: I have reviewed the above documentation for accuracy and completeness, and I agree with the above.  Sarina Ser, MD   I, Othelia Pulling, RMA, am acting as scribe for Sarina Ser, MD .

## 2020-03-31 ENCOUNTER — Encounter: Payer: Self-pay | Admitting: Dermatology

## 2020-04-20 ENCOUNTER — Other Ambulatory Visit: Payer: Self-pay | Admitting: Internal Medicine

## 2020-04-20 NOTE — Telephone Encounter (Signed)
Last filled on 12/29/2019 #90 with 0 refill  LOV 10/30/2019 CPE  No future appointments

## 2020-05-11 ENCOUNTER — Other Ambulatory Visit: Payer: Self-pay

## 2020-05-11 MED ORDER — SOLIFENACIN SUCCINATE 10 MG PO TABS: 10.0000 mg | ORAL_TABLET | Freq: Every day | ORAL | 3 refills | Status: DC

## 2020-05-31 ENCOUNTER — Encounter: Payer: Self-pay | Admitting: Internal Medicine

## 2020-05-31 NOTE — Telephone Encounter (Signed)
Ok to send in Triamcinolone cream, can take Benadryl OTC. If needs anything further, would recommend she schedule an appt.

## 2020-06-01 ENCOUNTER — Ambulatory Visit: Payer: Managed Care, Other (non HMO) | Admitting: Family Medicine

## 2020-06-01 ENCOUNTER — Encounter: Payer: Self-pay | Admitting: Family Medicine

## 2020-06-01 ENCOUNTER — Other Ambulatory Visit: Payer: Self-pay

## 2020-06-01 VITALS — BP 116/76 | HR 67 | Temp 96.9°F | Ht 65.5 in | Wt 151.1 lb

## 2020-06-01 DIAGNOSIS — R21 Rash and other nonspecific skin eruption: Secondary | ICD-10-CM | POA: Diagnosis not present

## 2020-06-01 DIAGNOSIS — L237 Allergic contact dermatitis due to plants, except food: Secondary | ICD-10-CM | POA: Diagnosis not present

## 2020-06-01 MED ORDER — ACYCLOVIR 200 MG PO CAPS: 200.0000 mg | ORAL_CAPSULE | Freq: Every day | ORAL | Status: DC

## 2020-06-01 MED ORDER — DEXAMETHASONE SODIUM PHOSPHATE 10 MG/ML IJ SOLN
10.0000 mg | Freq: Once | INTRAMUSCULAR | Status: AC
  Administered 2020-06-01: 10 mg via INTRAMUSCULAR

## 2020-06-01 MED ORDER — TRIAMCINOLONE ACETONIDE 0.5 % EX OINT: 1.0000 "application " | TOPICAL_OINTMENT | Freq: Two times a day (BID) | CUTANEOUS | 1 refills | Status: DC | PRN

## 2020-06-01 MED ORDER — SOLIFENACIN SUCCINATE 10 MG PO TABS: 5.0000 mg | ORAL_TABLET | Freq: Every day | ORAL | Status: DC

## 2020-06-01 NOTE — Progress Notes (Signed)
This visit occurred during the SARS-CoV-2 public health emergency.  Safety protocols were in place, including screening questions prior to the visit, additional usage of staff PPE, and extensive cleaning of exam room while observing appropriate contact time as indicated for disinfecting solutions.  Itching.  Benadryl helps.  Not sleeping well from itching.  H/o potential poison ivy exposure.  She has h/o sig reaction in the past.  Diffuse itching.  On trunk, face, arms.  Sx started about recently, last few days. No wheeze.  She had GI upset with oral steroids.  She has tolerated IM steroids.    Meds, vitals, and allergies reviewed.   ROS: Per HPI unless specifically indicated in ROS section   nad ncat She has irregular linear distribution of blanching maculopapular changes consistent with rhus dermatitis.  No stridor rrr ctab

## 2020-06-01 NOTE — Patient Instructions (Signed)
Update [place] as needed.  Take care.  Glad to see you. Use triamcinolone as needed but not on your face.

## 2020-06-02 DIAGNOSIS — R21 Rash and other nonspecific skin eruption: Secondary | ICD-10-CM | POA: Insufficient documentation

## 2020-06-02 NOTE — Assessment & Plan Note (Signed)
Presumed to be, and typical for, poison ivy exposure.  Discussed options.  Can use triamcinolone topically but not on the face.  She did not tolerate oral steroids well before.  She has tolerated IM steroids.  Dexamethasone 10mg  IM done at [facility].  Routine cautions given to patient.  Update [place] as needed.  She agrees.

## 2020-06-04 ENCOUNTER — Encounter: Payer: Self-pay | Admitting: Family Medicine

## 2020-06-04 MED ORDER — METHYLPREDNISOLONE 4 MG PO TBPK: ORAL_TABLET | ORAL | 0 refills | Status: DC

## 2020-06-04 NOTE — Telephone Encounter (Signed)
See my chart message

## 2020-06-29 ENCOUNTER — Other Ambulatory Visit: Payer: Self-pay

## 2020-06-29 ENCOUNTER — Ambulatory Visit (INDEPENDENT_AMBULATORY_CARE_PROVIDER_SITE_OTHER): Payer: Managed Care, Other (non HMO) | Admitting: Dermatology

## 2020-06-29 DIAGNOSIS — L988 Other specified disorders of the skin and subcutaneous tissue: Secondary | ICD-10-CM

## 2020-06-29 NOTE — Progress Notes (Signed)
   Follow-Up Visit   Subjective  [name] is a 69 y.o. female who presents for the following: Facial Elastosis (patient is here today for Botox ).  The following portions of the chart were reviewed this encounter and updated as appropriate:  Tobacco  Allergies  Meds  Problems  Med Hx  Surg Hx  Fam Hx     Review of Systems:  No other skin or systemic complaints except as noted in HPI or Assessment and Plan.  Objective  Well appearing patient in no apparent distress; mood and affect are within normal limits.  A focused examination was performed including the face. Relevant physical exam findings are noted in the Assessment and Plan.  Objective  Head - Anterior (Face): Rhytides and volume loss.   Images     Assessment & Plan    Elastosis of skin Head - Anterior (Face)  Botox Injection - Head - Anterior (Face) Location: See attached image  Informed consent: Discussed risks (infection, pain, bleeding, bruising, swelling, allergic reaction, paralysis of nearby muscles, eyelid droop, double vision, neck weakness, difficulty breathing, headache, undesirable cosmetic result, and need for additional treatment) and benefits of the procedure, as well as the alternatives.  Informed consent was obtained.  Preparation: The area was cleansed with alcohol.  Procedure Details:  Botox was injected into the dermis with a 30-gauge needle. Pressure applied to any bleeding. Ice packs offered for swelling.  Lot Number:  [id number] Expiration:  10/2022  Total Units Injected:  20  Plan: Patient was instructed to remain upright for 4 hours. Patient was instructed to avoid massaging the face and avoid vigorous exercise for the rest of the day. Tylenol may be used for headache.  Allow 2 weeks before returning to clinic for additional dosing as needed. Patient will call for any problems.   Return for cosmetic - 3.5 months.  Luther Redo, CMA, am acting as scribe for Sarina Ser, MD  .  Documentation: I have reviewed the above documentation for accuracy and completeness, and I agree with the above.  Sarina Ser, MD

## 2020-07-01 ENCOUNTER — Encounter: Payer: Self-pay | Admitting: Dermatology

## 2020-08-04 ENCOUNTER — Other Ambulatory Visit: Payer: Self-pay | Admitting: Psychiatry

## 2020-08-04 DIAGNOSIS — F411 Generalized anxiety disorder: Secondary | ICD-10-CM

## 2020-08-05 ENCOUNTER — Telehealth (INDEPENDENT_AMBULATORY_CARE_PROVIDER_SITE_OTHER): Payer: 59 | Admitting: Psychiatry

## 2020-08-05 ENCOUNTER — Other Ambulatory Visit: Payer: Self-pay

## 2020-08-05 ENCOUNTER — Encounter: Payer: Self-pay | Admitting: Psychiatry

## 2020-08-05 DIAGNOSIS — F411 Generalized anxiety disorder: Secondary | ICD-10-CM

## 2020-08-05 DIAGNOSIS — F4312 Post-traumatic stress disorder, chronic: Secondary | ICD-10-CM | POA: Diagnosis not present

## 2020-08-05 MED ORDER — GABAPENTIN 300 MG PO CAPS: 300.0000 mg | ORAL_CAPSULE | Freq: Two times a day (BID) | ORAL | 1 refills | Status: DC

## 2020-08-05 MED ORDER — MIRTAZAPINE 7.5 MG PO TABS: 7.5000 mg | ORAL_TABLET | Freq: Every day | ORAL | 1 refills | Status: DC

## 2020-08-05 NOTE — Progress Notes (Signed)
Provider Location : [facility] Patient Location : Home  Participants: Patient , Provider  Virtual Visit via Video Note  I connected with [name] on 08/05/20 at  2:00 PM EDT by a video enabled telemedicine application and verified that I am speaking with the correct person using two identifiers.   I discussed the limitations of evaluation and management by telemedicine and the availability of in person appointments. The patient expressed understanding and agreed to proceed.    I discussed the assessment and treatment plan with the patient. The patient was provided an opportunity to ask questions and all were answered. The patient agreed with the plan and demonstrated an understanding of the instructions.   The patient was advised to call back or seek an in-person evaluation if the symptoms worsen or if the condition fails to improve as anticipated.   [place] MD OP Progress Note  08/05/2020 6:06 PM Venora Kautzman  MRN:  [id number]  Chief Complaint:  Chief Complaint    Follow-up     HPI: [name] is a 69 year old Caucasian female, retired, married, lives in [place], has a history of PTSD, GAD was evaluated by telemedicine today.  Patient today reports she is currently doing well with regards to her mood.  She denies any significant anxiety symptoms.  She denies any significant depression.  She reports she is sleeping well.  She continues to take mirtazapine for the same.  She no longer takes the BuSpar and reports she is doing well without it.  She reports that gabapentin is beneficial since it also helps with her aches and pain.  Patient denies any suicidality, homicidality or perceptual disturbances.  She continues to be in therapy with [name] and reports therapy sessions as beneficial.  She sees her every 4 weeks or so.  She reports she is trying to stay safe from the COVID-19 virus and is vaccinated.  She did get infected last year and does not want to go through  that again.  Patient continues to report good relationship with her children and grandkids and reports she is enjoying her summer.    Visit Diagnosis:    ICD-10-CM   1. GAD (generalized anxiety disorder)  F41.1 gabapentin (NEURONTIN) 300 MG capsule    mirtazapine (REMERON) 7.5 MG tablet  2. Chronic post-traumatic stress disorder (PTSD)  F43.12     Past Psychiatric History: I have reviewed past psychiatric history from my progress note on 03/11/2018  Past Medical History:  Past Medical History:  Diagnosis Date  . Anxiety   . Depression   . Thyroid disease     Past Surgical History:  Procedure Laterality Date  . ABDOMINAL HYSTERECTOMY  1992   Total  . BREAST BIOPSY Right 07/20/2015   neg fat necrosis  . BREAST EXCISIONAL BIOPSY Right 1960's   negative. No scar seen  . CERVICAL FUSION  2005,2010   C2-C5  . REDUCTION MAMMAPLASTY Bilateral 2016  . TONSILLECTOMY  1961    Family Psychiatric History: I have reviewed family psychiatric history from my progress note on 03/11/2018  Family History:  Family History  Problem Relation Age of Onset  . Cancer Mother        Uterine, Ovarian  . Anxiety disorder Mother   . Depression Mother   . Heart disease Father   . Stroke Father   . Diabetes Father   . Cancer Maternal Grandmother   . ADD / ADHD Daughter   . ADD / ADHD Grandchild   . Prostate cancer  Neg Hx   . Kidney cancer Neg Hx   . Breast cancer Neg Hx     Social History: I have reviewed social history from my progress note on 03/11/2018 Social History   Socioeconomic History  . Marital status: Married    Spouse name: jeff  . Number of children: 2  . Years of education: Not on file  . Highest education level: Master's degree (e.g., MA, MS, MEng, MEd, MSW, MBA)  Occupational History    Comment: retired  Tobacco Use  . Smoking status: Never Smoker  . Smokeless tobacco: Never Used  Vaping Use  . Vaping Use: Never used  Substance and Sexual Activity  . Alcohol use: Yes     Alcohol/week: 1.0 standard drink    Types: 1 Glasses of wine per week    Comment: 2-3 nights week--1 glass of wine  . Drug use: No  . Sexual activity: Yes    Birth control/protection: None  Other Topics Concern  . Not on file  Social History Narrative  . Not on file   Social Determinants of Health   Financial Resource Strain:   . Difficulty of Paying Living Expenses: Not on file  Food Insecurity:   . Worried About Charity fundraiser in the Last Year: Not on file  . Ran Out of Food in the Last Year: Not on file  Transportation Needs:   . Lack of Transportation (Medical): Not on file  . Lack of Transportation (Non-Medical): Not on file  Physical Activity:   . Days of Exercise per Week: Not on file  . Minutes of Exercise per Session: Not on file  Stress:   . Feeling of Stress : Not on file  Social Connections:   . Frequency of Communication with Friends and Family: Not on file  . Frequency of Social Gatherings with Friends and Family: Not on file  . Attends Religious Services: Not on file  . Active Member of Clubs or Organizations: Not on file  . Attends Archivist Meetings: Not on file  . Marital Status: Not on file    Allergies:  Allergies  Allergen Reactions  . Codeine Hives  . Iodinated Diagnostic Agents Itching  . Metrizamide Itching    Metabolic Disorder Labs: No results found for: HGBA1C, MPG No results found for: PROLACTIN Lab Results  Component Value Date   CHOL 230 (H) 10/30/2019   TRIG 129 10/30/2019   HDL 57 10/30/2019   CHOLHDL 4.0 10/30/2019   LDLCALC 150 (H) 10/30/2019   LDLCALC 155 (H) 10/09/2016   Lab Results  Component Value Date   TSH 1.440 10/30/2019   TSH 4.580 (H) 03/11/2018    Therapeutic Level Labs: No results found for: LITHIUM No results found for: VALPROATE No components found for:  CBMZ  Current Medications: Current Outpatient Medications  Medication Sig Dispense Refill  . acyclovir (ZOVIRAX) 200 MG capsule  Take 1 capsule (200 mg total) by mouth daily. As needed.    . Biotin (BIOTIN 5000) 5 MG CAPS Take by mouth.    . Cholecalciferol (VITAMIN D3) 50 MCG (2000 UT) capsule Take by mouth.    . doxycycline (MONODOX) 50 MG capsule 2 (two) times daily.   2  . gabapentin (NEURONTIN) 300 MG capsule Take 1 capsule (300 mg total) by mouth 2 (two) times daily. 180 capsule 1  . ibuprofen (ADVIL,MOTRIN) 200 MG tablet Take 200 mg by mouth every 6 (six) hours as needed for moderate pain.    Marland Kitchen  levothyroxine (SYNTHROID, LEVOTHROID) 75 MCG tablet Take 75 mcg by mouth daily before breakfast.    . methylPREDNISolone (MEDROL DOSEPAK) 4 MG TBPK tablet Take with food as directed. 21 tablet 0  . mirtazapine (REMERON) 7.5 MG tablet Take 1 tablet (7.5 mg total) by mouth at bedtime. 90 tablet 1  . Multiple Vitamins-Minerals (OCUVITE ADULT 50+ PO) Take by mouth.    . Omega-3 Fatty Acids (FISH OIL) 1000 MG CAPS Take by mouth.    . propranolol (INDERAL) 10 MG tablet TAKE 1 TABLET BY MOUTH 3  TIMES DAILY AS NEEDED 270 tablet 3  . solifenacin (VESICARE) 10 MG tablet Take 0.5 tablets (5 mg total) by mouth daily.    Marland Kitchen triamcinolone ointment (KENALOG) 0.5 % Apply 1 application topically 2 (two) times daily as needed. Don't use on the face 30 g 1  . White Petrolatum-Mineral Oil (GENTEAL TEARS NIGHT-TIME) OINT Apply to eye.     No current facility-administered medications for this visit.     Musculoskeletal: Strength & Muscle Tone: UTA Gait & Station: normal Patient leans: N/A  Psychiatric Specialty Exam: Review of Systems  Psychiatric/Behavioral: Negative for agitation, behavioral problems, confusion, decreased concentration, dysphoric mood, hallucinations, self-injury, sleep disturbance and suicidal ideas. The patient is not nervous/anxious and is not hyperactive.   All other systems reviewed and are negative.   There were no vitals taken for this visit.There is no height or weight on file to calculate BMI.  General  Appearance: Casual  Eye Contact:  Fair  Speech:  Normal Rate  Volume:  Normal  Mood:  Euthymic  Affect:  Congruent  Thought Process:  Goal Directed and Descriptions of Associations: Intact  Orientation:  Full (Time, Place, and Person)  Thought Content: Logical   Suicidal Thoughts:  No  Homicidal Thoughts:  No  Memory:  Immediate;   Fair Recent;   Fair Remote;   Fair  Judgement:  Fair  Insight:  Fair  Psychomotor Activity:  Normal  Concentration:  Concentration: Fair and Attention Span: Fair  Recall:  [employer] of Knowledge: Fair  Language: Fair  Akathisia:  No  Handed:  Right  AIMS (if indicated): UTA  Assets:  Communication Skills Desire for Improvement Housing Social Support  ADL's:  Intact  Cognition: WNL  Sleep:  Fair   Screenings: GAD-7     Office Visit from 08/14/2019 in [place]  Total GAD-7 Score 5    PHQ2-9     Office Visit from 10/30/2019 in [place] at [facility] Visit from 10/09/2016 in [place] at [facility] Visit from 04/18/2016 in [place] at [facility] Visit from 07/30/2014 in [place] at [facility] Total Score 0 0 0 0       Assessment and Plan: Sequita Wise is a 69 year old Caucasian female who is married, retired, lives in [place], has a history of PTSD, anxiety disorder, thyroid problems was evaluated by telemedicine today.  Patient is currently doing well on the current medication regimen.  Plan as noted below.  Plan GAD-stable Gabapentin 300 mg p.o. 3 times daily Discontinue BuSpar for noncompliance Mirtazapine 7.5 mg p.o. nightly for sleep Propranolol 10 mg p.o. 3 times daily as needed  PTSD-stable Continue CBT with [name]. Continue Mirtazapine 7.5 mg po qhs for sleep.   Follow-up in clinic in 5 to 6 months or sooner if needed.  I have spent atleast 20 minutes face to face with patient today. More than  50 % of the time  was spent for preparing to see the patient ( e.g., review of test, records ),  ordering medications and test ,psychoeducation and supportive psychotherapy and care coordination,as well as documenting clinical information in electronic health record. This note was generated in part or whole with voice recognition software. Voice recognition is usually quite accurate but there are transcription errors that can and very often do occur. I apologize for any typographical errors that were not detected and corrected.        Ursula Alert, MD 08/05/2020, 6:06 PM

## 2020-09-06 ENCOUNTER — Encounter: Payer: Self-pay | Admitting: Internal Medicine

## 2020-09-07 ENCOUNTER — Other Ambulatory Visit: Payer: Self-pay

## 2020-09-07 ENCOUNTER — Encounter: Payer: Self-pay | Admitting: Dermatology

## 2020-09-07 ENCOUNTER — Ambulatory Visit (INDEPENDENT_AMBULATORY_CARE_PROVIDER_SITE_OTHER): Payer: Self-pay | Admitting: Dermatology

## 2020-09-07 DIAGNOSIS — L988 Other specified disorders of the skin and subcutaneous tissue: Secondary | ICD-10-CM

## 2020-09-07 NOTE — Progress Notes (Signed)
   Follow-Up Visit   Subjective  [name] is a 69 y.o. female who presents for the following: Facial Elastosis (face, pt presents for Botox, last treatment 06/29/2020). She also would like to discuss treatment for sagging skin on the cheeks.  She had a discussion with Sonia Baller, our laser technician about fractional laser/Halo laser  and BBL in the past.  The following portions of the chart were reviewed this encounter and updated as appropriate:  Tobacco  Allergies  Meds  Problems  Med Hx  Surg Hx  Fam Hx     Review of Systems:  No other skin or systemic complaints except as noted in HPI or Assessment and Plan.  Objective  Well appearing patient in no apparent distress; mood and affect are within normal limits.  A focused examination was performed including face. Relevant physical exam findings are noted in the Assessment and Plan.  Objective  face: Rhytides and volume loss.   Images     Assessment & Plan  Elastosis of skin face  Botox today 20 units injected as marked: - Frown Complex  Discussed Mid face injections of Voluma  Intralesional injection - face Location: See attached image  Informed consent: Discussed risks (infection, pain, bleeding, bruising, swelling, allergic reaction, paralysis of nearby muscles, eyelid droop, double vision, neck weakness, difficulty breathing, headache, undesirable cosmetic result, and need for additional treatment) and benefits of the procedure, as well as the alternatives.  Informed consent was obtained.  Preparation: The area was cleansed with alcohol.  Procedure Details:  Botox was injected into the dermis with a 30-gauge needle. Pressure applied to any bleeding. Ice packs offered for swelling.  Lot Number:  [id number] Expiration:  10/2022  Total Units Injected:  20  Plan: Patient was instructed to remain upright for 4 hours. Patient was instructed to avoid massaging the face and avoid vigorous exercise for the rest of the  day. Tylenol may be used for headache.  Allow 2 weeks before returning to clinic for additional dosing as needed. Patient will call for any problems.   Return for 3-64m Botox.    Discussed mid facial the Luma injections for lifting the cheeks and improving the volume loss of the midface. Also discussed fractional laser/Halo treatments to improve the crepyiness and fine wrinkling. The above discussion took place in detail.  Advised that these are 2 different things 1-volume loss and 2-crepyiness and fine wrinkling that are treated by different modalities.  I, Othelia Pulling, RMA, am acting as scribe for Sarina Ser, MD .  Documentation: I have reviewed the above documentation for accuracy and completeness, and I agree with the above.  Sarina Ser, MD

## 2020-09-22 ENCOUNTER — Telehealth: Payer: Self-pay | Admitting: Internal Medicine

## 2020-09-22 ENCOUNTER — Encounter: Payer: Self-pay | Admitting: Internal Medicine

## 2020-09-22 NOTE — Telephone Encounter (Signed)
Ok to work in.

## 2020-09-22 NOTE — Telephone Encounter (Signed)
Reached out to patient to reschedule physical due to Rollene Fare being out.Patient demanding to have physical sooner than next available. She has been scheduled for Nov 23 at 12:15 Is this ok to accommodate?

## 2020-09-30 ENCOUNTER — Ambulatory Visit (INDEPENDENT_AMBULATORY_CARE_PROVIDER_SITE_OTHER): Payer: Managed Care, Other (non HMO) | Admitting: Dermatology

## 2020-09-30 ENCOUNTER — Other Ambulatory Visit: Payer: Self-pay

## 2020-09-30 DIAGNOSIS — Z85828 Personal history of other malignant neoplasm of skin: Secondary | ICD-10-CM

## 2020-09-30 DIAGNOSIS — Z1283 Encounter for screening for malignant neoplasm of skin: Secondary | ICD-10-CM | POA: Diagnosis not present

## 2020-09-30 DIAGNOSIS — L814 Other melanin hyperpigmentation: Secondary | ICD-10-CM

## 2020-09-30 DIAGNOSIS — Z9889 Other specified postprocedural states: Secondary | ICD-10-CM

## 2020-09-30 DIAGNOSIS — D1800 Hemangioma unspecified site: Secondary | ICD-10-CM

## 2020-09-30 DIAGNOSIS — D229 Melanocytic nevi, unspecified: Secondary | ICD-10-CM | POA: Diagnosis not present

## 2020-09-30 DIAGNOSIS — L578 Other skin changes due to chronic exposure to nonionizing radiation: Secondary | ICD-10-CM

## 2020-09-30 DIAGNOSIS — L821 Other seborrheic keratosis: Secondary | ICD-10-CM

## 2020-09-30 DIAGNOSIS — L820 Inflamed seborrheic keratosis: Secondary | ICD-10-CM

## 2020-09-30 NOTE — Patient Instructions (Signed)

## 2020-09-30 NOTE — Progress Notes (Signed)
Follow-Up Visit   Subjective  [name] is a 69 y.o. female who presents for the following: Annual Exam (Pt presents for TBSE, hx of skin cancer several years ago ).  Pt had a breast reduction years ago now on the L breast she has area that will flare up when she exercises or when her heart rate is elevated  The patient presents for Total-Body Skin Exam (TBSE) for skin cancer screening and mole check.  The following portions of the chart were reviewed this encounter and updated as appropriate:  Tobacco  Allergies  Meds  Problems  Med Hx  Surg Hx  Fam Hx     Review of Systems:  No other skin or systemic complaints except as noted in HPI or Assessment and Plan.  Objective  Well appearing patient in no apparent distress; mood and affect are within normal limits.  A full examination was performed including scalp, head, eyes, ears, nose, lips, neck, chest, axillae, abdomen, back, buttocks, bilateral upper extremities, bilateral lower extremities, hands, feet, fingers, toes, fingernails, and toenails. All findings within normal limits unless otherwise noted below.  Objective  Left Thigh - Anterior: Well healed scar with no evidence of recurrence.   Objective  Left areola 10 o' clock: 1.0 cm Firm flesh papule   Images      Objective  chest (6): Erythematous keratotic or waxy stuck-on papule or plaque.    Assessment & Plan  History of basal cell carcinoma (BCC) Left Thigh - Anterior Clear. Observe for recurrence. Call clinic for new or changing lesions.  Recommend regular skin exams, daily broad-spectrum spf 30+ sunscreen use, and photoprotection.     History of reduction surgery of left breast with symptomatic patch at 10 o'clock margin of areola. Left areola 10 o' clock Suspect scar tissue from breast reduction 7 years ago  Recommend biopsy, pt declines biopsy today.   she will consult with her PCP or surgeon who performed breast reduction 7 years ago. Photos  taken today.  Inflamed seborrheic keratosis (6) chest  Destruction of lesion - chest Complexity: simple   Destruction method: cryotherapy   Informed consent: discussed and consent obtained   Timeout:  patient name, date of birth, surgical site, and procedure verified Lesion destroyed using liquid nitrogen: Yes   Region frozen until ice ball extended beyond lesion: Yes   Outcome: patient tolerated procedure well with no complications   Post-procedure details: wound care instructions given     Lentigines - Scattered tan macules - Discussed due to sun exposure - Benign, observe - Call for any changes  Seborrheic Keratoses - Stuck-on, waxy, tan-brown papules and plaques  - Discussed benign etiology and prognosis. - Observe - Call for any changes  Melanocytic Nevi - Tan-brown and/or pink-flesh-colored symmetric macules and papules - Benign appearing on exam today - Observation - Call clinic for new or changing moles - Recommend daily use of broad spectrum spf 30+ sunscreen to sun-exposed areas.   Hemangiomas - Red papules - Discussed benign nature - Observe - Call for any changes  Actinic Damage - diffuse scaly erythematous macules with underlying dyspigmentation - Recommend daily broad spectrum sunscreen SPF 30+ to sun-exposed areas, reapply every 2 hours as needed.  - Call for new or changing lesions.  Skin cancer screening performed today.  Return in about 1 year (around 09/30/2021) for TBSE .  Marta Lamas, CMA, am acting as scribe for Sarina Ser, MD .  Documentation: I have reviewed the above documentation for  accuracy and completeness, and I agree with the above.  Sarina Ser, MD

## 2020-10-05 ENCOUNTER — Encounter: Payer: Self-pay | Admitting: Dermatology

## 2020-10-14 ENCOUNTER — Other Ambulatory Visit: Payer: Self-pay

## 2020-10-14 ENCOUNTER — Ambulatory Visit (INDEPENDENT_AMBULATORY_CARE_PROVIDER_SITE_OTHER): Payer: Self-pay | Admitting: Dermatology

## 2020-10-14 DIAGNOSIS — L988 Other specified disorders of the skin and subcutaneous tissue: Secondary | ICD-10-CM

## 2020-10-14 NOTE — Patient Instructions (Signed)
Arnica cream for bruising

## 2020-10-14 NOTE — Progress Notes (Addendum)
   Follow-Up Visit   Subjective  [name] is a 69 y.o. female who presents for the following: Facial Elastosis (face, pt presents for fillers today).  The following portions of the chart were reviewed this encounter and updated as appropriate:  Tobacco  Allergies  Meds  Problems  Med Hx  Surg Hx  Fam Hx     Review of Systems:  No other skin or systemic complaints except as noted in HPI or Assessment and Plan.  Objective  Well appearing patient in no apparent distress; mood and affect are within normal limits.  A focused examination was performed including face. Relevant physical exam findings are noted in the Assessment and Plan.  Objective  mid face: Rhytides and volume loss.   Images                                 Assessment & Plan  Elastosis of skin mid face  Voluma XC 40ml injected to: Mid face  Lot [id number] exp 07/12/2021  Filling material injection - mid face Prior to the procedure, the patient's past medical history, allergies and the rare but potential risks and complications were reviewed with the patient and a signed consent was obtained. Pre and post-treatment care was discussed and instructions provided.  Location: cheeks  Filler Type: Juvederm Voluma  Procedure: The area was prepped thoroughly with hibiclens The area was prepped thoroughly with Puracyn. After introducing the needle into the desired treatment area, the syringe plunger was drawn back to ensure there was no flash of blood prior to injecting the filler in order to minimize risk of intravascular injection and vascular occlusion. After injection of the filler, the treated areas were cleansed and iced to reduce swelling. Post-treatment instructions were reviewed with the patient.       Patient tolerated the procedure well. The patient will call with any problems, questions or concerns prior to their next appointment.   Return if symptoms worsen or fail to  improve.  I, Othelia Pulling, RMA, am acting as scribe for Sarina Ser, MD .  Documentation: I have reviewed the above documentation for accuracy and completeness, and I agree with the above.  Sarina Ser, MD

## 2020-10-15 ENCOUNTER — Encounter: Payer: Self-pay | Admitting: Dermatology

## 2020-10-25 ENCOUNTER — Other Ambulatory Visit: Payer: Self-pay | Admitting: Plastic Surgery

## 2020-11-01 ENCOUNTER — Encounter: Payer: Managed Care, Other (non HMO) | Admitting: Internal Medicine

## 2020-11-02 ENCOUNTER — Other Ambulatory Visit: Payer: Self-pay

## 2020-11-02 ENCOUNTER — Encounter: Payer: Self-pay | Admitting: Internal Medicine

## 2020-11-02 ENCOUNTER — Ambulatory Visit (INDEPENDENT_AMBULATORY_CARE_PROVIDER_SITE_OTHER): Payer: Managed Care, Other (non HMO) | Admitting: Internal Medicine

## 2020-11-02 VITALS — BP 112/74 | HR 70 | Temp 96.8°F | Ht 65.25 in | Wt 149.0 lb

## 2020-11-02 DIAGNOSIS — Z23 Encounter for immunization: Secondary | ICD-10-CM | POA: Diagnosis not present

## 2020-11-02 DIAGNOSIS — F411 Generalized anxiety disorder: Secondary | ICD-10-CM | POA: Diagnosis not present

## 2020-11-02 DIAGNOSIS — F419 Anxiety disorder, unspecified: Secondary | ICD-10-CM | POA: Diagnosis not present

## 2020-11-02 DIAGNOSIS — F4312 Post-traumatic stress disorder, chronic: Secondary | ICD-10-CM | POA: Diagnosis not present

## 2020-11-02 DIAGNOSIS — Z0001 Encounter for general adult medical examination with abnormal findings: Secondary | ICD-10-CM | POA: Diagnosis not present

## 2020-11-02 DIAGNOSIS — E039 Hypothyroidism, unspecified: Secondary | ICD-10-CM

## 2020-11-02 DIAGNOSIS — N3281 Overactive bladder: Secondary | ICD-10-CM

## 2020-11-02 DIAGNOSIS — F32A Depression, unspecified: Secondary | ICD-10-CM

## 2020-11-02 DIAGNOSIS — Z8619 Personal history of other infectious and parasitic diseases: Secondary | ICD-10-CM | POA: Diagnosis not present

## 2020-11-02 NOTE — Assessment & Plan Note (Signed)
Stable on Propranolol and Mirtazapine Support offered

## 2020-11-02 NOTE — Patient Instructions (Signed)

## 2020-11-02 NOTE — Assessment & Plan Note (Signed)
Stable on Mirtazapine and Propranolol Support offered

## 2020-11-02 NOTE — Progress Notes (Signed)
Subjective:    Patient ID: [name], female    DOB: 07/15/1951, 69 y.o.   MRN: [id number]  HPI  Patient presents the clinic today for her annual exam.  She is also due to follow-up chronic conditions.  Hypothyroidism: She denies any issues of her current dose of Levothyroxine.  She does follow with endocrinology.  OAB: Managed with Vesicare.  She follows with urology.  Anxiety/Depression: Improved, she is not taking Buspar but is taking Mirtazapine. She takes Propanolol as needed. She is not currently seeing a therapist. She denies SI/HI.  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  She takes Acyclovir as needed with good relief of symptoms.  Chronic Eye Pain: Managed with Gabepentin. She has failed wean of this in the past.   Flu: 09/2020 Tetanus: 09/2016 Covid: Pfizer Pneumovax: Never Prevnar: 10/2019 Zostavax: 11/2016 Shingrix: 10/2019, 12/2019 Pap smear: hysterectomy Mammogram: 12/2019 Bone density: 10/2016 Colon screening: 05/2016 Vision screening: annually Dentist: biannually  Diet: She does eat meat. She consumes fruits and veggies daily. She tries to avoid fried foods. She drinks mostly coffee, water. Exercise:  Review of Systems  Past Medical History:  Diagnosis Date  . Anxiety   . Depression   . Thyroid disease     Current Outpatient Medications  Medication Sig Dispense Refill  . acyclovir (ZOVIRAX) 200 MG capsule Take 1 capsule (200 mg total) by mouth daily. As needed.    . Biotin (BIOTIN 5000) 5 MG CAPS Take by mouth.    . Cholecalciferol (VITAMIN D3) 50 MCG (2000 UT) capsule Take by mouth.    . doxycycline (MONODOX) 50 MG capsule 2 (two) times daily.   2  . gabapentin (NEURONTIN) 300 MG capsule Take 1 capsule (300 mg total) by mouth 2 (two) times daily. 180 capsule 1  . ibuprofen (ADVIL,MOTRIN) 200 MG tablet Take 200 mg by mouth every 6 (six) hours as needed for moderate pain.    Marland Kitchen levothyroxine (SYNTHROID, LEVOTHROID) 75 MCG tablet Take 75  mcg by mouth daily before breakfast.    . methylPREDNISolone (MEDROL DOSEPAK) 4 MG TBPK tablet Take with food as directed. 21 tablet 0  . mirtazapine (REMERON) 7.5 MG tablet Take 1 tablet (7.5 mg total) by mouth at bedtime. 90 tablet 1  . Multiple Vitamins-Minerals (OCUVITE ADULT 50+ PO) Take by mouth.    . Omega-3 Fatty Acids (FISH OIL) 1000 MG CAPS Take by mouth.    . propranolol (INDERAL) 10 MG tablet TAKE 1 TABLET BY MOUTH 3  TIMES DAILY AS NEEDED 270 tablet 3  . solifenacin (VESICARE) 10 MG tablet Take 0.5 tablets (5 mg total) by mouth daily.    Marland Kitchen triamcinolone ointment (KENALOG) 0.5 % Apply 1 application topically 2 (two) times daily as needed. Don't use on the face 30 g 1  . White Petrolatum-Mineral Oil (GENTEAL TEARS NIGHT-TIME) OINT Apply to eye.     No current facility-administered medications for this visit.    Allergies  Allergen Reactions  . Codeine Hives  . Iodinated Diagnostic Agents Itching  . Metrizamide Itching    Family History  Problem Relation Age of Onset  . Cancer Mother        Uterine, Ovarian  . Anxiety disorder Mother   . Depression Mother   . Heart disease Father   . Stroke Father   . Diabetes Father   . Cancer Maternal Grandmother   . ADD / ADHD Daughter   . ADD / ADHD Grandchild   . Prostate cancer Neg  Hx   . Kidney cancer Neg Hx   . Breast cancer Neg Hx     Social History   Socioeconomic History  . Marital status: Married    Spouse name: jeff  . Number of children: 2  . Years of education: Not on file  . Highest education level: Master's degree (e.g., MA, MS, MEng, MEd, MSW, MBA)  Occupational History    Comment: retired  Tobacco Use  . Smoking status: Never Smoker  . Smokeless tobacco: Never Used  Vaping Use  . Vaping Use: Never used  Substance and Sexual Activity  . Alcohol use: Yes    Alcohol/week: 1.0 standard drink    Types: 1 Glasses of wine per week    Comment: 2-3 nights week--1 glass of wine  . Drug use: No  . Sexual  activity: Yes    Birth control/protection: None  Other Topics Concern  . Not on file  Social History Narrative  . Not on file   Social Determinants of Health   Financial Resource Strain:   . Difficulty of Paying Living Expenses: Not on file  Food Insecurity:   . Worried About Charity fundraiser in the Last Year: Not on file  . Ran Out of Food in the Last Year: Not on file  Transportation Needs:   . Lack of Transportation (Medical): Not on file  . Lack of Transportation (Non-Medical): Not on file  Physical Activity:   . Days of Exercise per Week: Not on file  . Minutes of Exercise per Session: Not on file  Stress:   . Feeling of Stress : Not on file  Social Connections:   . Frequency of Communication with Friends and Family: Not on file  . Frequency of Social Gatherings with Friends and Family: Not on file  . Attends Religious Services: Not on file  . Active Member of Clubs or Organizations: Not on file  . Attends Archivist Meetings: Not on file  . Marital Status: Not on file  Intimate Partner Violence:   . Fear of Current or Ex-Partner: Not on file  . Emotionally Abused: Not on file  . Physically Abused: Not on file  . Sexually Abused: Not on file     Constitutional: Denies fever, malaise, fatigue, headache or abrupt weight changes.  HEENT: Pt reports chronic eye pain. Denies eye pain, eye redness, ear pain, ringing in the ears, wax buildup, runny nose, nasal congestion, bloody nose, or sore throat. Respiratory: Denies difficulty breathing, shortness of breath, cough or sputum production.   Cardiovascular: Denies chest pain, chest tightness, palpitations or swelling in the hands or feet.  Gastrointestinal: Denies abdominal pain, bloating, constipation, diarrhea or blood in the stool.  GU: Pt reports urinary frequency. Denies urgency, pain with urination, burning sensation, blood in urine, odor or discharge. Musculoskeletal: Denies decrease in range of motion,  difficulty with gait, muscle pain or joint pain and swelling.  Skin: Denies redness, rashes, lesions or ulcercations.  Neurological: Denies dizziness, difficulty with memory, difficulty with speech or problems with balance and coordination.  Psych: Patient has a history of anxiety and depression.  Denies SI/HI.  No other specific complaints in a complete review of systems (except as listed in HPI above).     Objective:   Physical Exam   BP 112/74   Pulse 70   Temp (!) 96.8 F (36 C) (Temporal)   Ht 5' 5.25" (1.657 m)   Wt 149 lb (67.6 kg)   SpO2 98%  BMI 24.61 kg/m   Wt Readings from Last 3 Encounters:  06/01/20 151 lb 2 oz (68.5 kg)  10/30/19 163 lb (73.9 kg)  10/11/19 150 lb (68 kg)    General: Appears her stated age, well developed, well nourished in NAD. Skin: Warm, dry and intact. No rashes, lesions or ulcerations noted. HEENT: Head: normal shape and size; Eyes: sclera white, no icterus, conjunctiva pink, PERRLA and EOMs intact;  Neck:  Neck supple, trachea midline. No masses, lumps or thyromegaly present.  Cardiovascular: Normal rate and rhythm. S1,S2 noted.  No murmur, rubs or gallops noted. No JVD or BLE edema. No carotid bruits noted. Pulmonary/Chest: Normal effort and positive vesicular breath sounds. No respiratory distress. No wheezes, rales or ronchi noted.  Abdomen: Soft and nontender. Normal bowel sounds. No distention or masses noted. Liver, spleen and kidneys non palpable. Musculoskeletal: Strength 5/5 BUE/BLE. No difficulty with gait.  Neurological: Alert and oriented. Cranial nerves II-XII grossly intact. Coordination normal.  Psychiatric: Mood and affect normal. Behavior is normal. Judgment and thought content normal.     BMET    Component Value Date/Time   NA 142 10/30/2019 1316   K 4.4 10/30/2019 1316   CL 106 10/30/2019 1316   CO2 26 10/30/2019 1316   GLUCOSE 96 10/30/2019 1316   GLUCOSE 97 10/11/2019 1815   BUN 14 10/30/2019 1316    CREATININE 0.79 10/30/2019 1316   CALCIUM 9.5 10/30/2019 1316   GFRNONAA 78 10/30/2019 1316   GFRAA 90 10/30/2019 1316    Lipid Panel     Component Value Date/Time   CHOL 230 (H) 10/30/2019 1316   TRIG 129 10/30/2019 1316   HDL 57 10/30/2019 1316   CHOLHDL 4.0 10/30/2019 1316   LDLCALC 150 (H) 10/30/2019 1316    CBC    Component Value Date/Time   WBC 6.5 10/30/2019 1316   WBC 3.5 (L) 10/11/2019 1815   RBC 4.62 10/30/2019 1316   RBC 4.82 10/11/2019 1815   HGB 14.1 10/30/2019 1316   HCT 41.6 10/30/2019 1316   PLT 173 10/30/2019 1316   MCV 90 10/30/2019 1316   MCH 30.5 10/30/2019 1316   MCH 30.1 10/11/2019 1815   MCHC 33.9 10/30/2019 1316   MCHC 32.7 10/11/2019 1815   RDW 12.8 10/30/2019 1316   LYMPHSABS 1.9 10/11/2019 1815   MONOABS 0.3 10/11/2019 1815   EOSABS 0.0 10/11/2019 1815   BASOSABS 0.0 10/11/2019 1815    Hgb A1C No results found for: HGBA1C         Assessment & Plan:   Preventative Health Maintenance:  Flu shot UTD Tetanus UTD Covid UTD Pneumovax today Prevnar UTD Zostavax UTD Shingrix UTD She no longer needs pap  Mammogram UTD Bone density due 2022 Colon screening UTD Encouraged her to consume a balanced diet exercise regimen Advised her to see an eye doctor and dentist annually We will check CBC, C met, TSH, free T4, lipid and vitamin D today  RTC in 1 year, sooner if needed  Webb Silversmith, NP This visit occurred during the SARS-CoV-2 public health emergency.  Safety protocols were in place, including screening questions prior to the visit, additional usage of staff PPE, and extensive cleaning of exam room while observing appropriate contact time as indicated for disinfecting solutions.

## 2020-11-02 NOTE — Addendum Note (Signed)
Addended by: Lurlean Nanny on: 11/02/2020 12:59 PM   Modules accepted: Orders

## 2020-11-02 NOTE — Assessment & Plan Note (Signed)
Continue Vesicare Will monitor

## 2020-11-02 NOTE — Assessment & Plan Note (Signed)
TSH and Free T4 today Continue Levothyroxine 

## 2020-11-02 NOTE — Assessment & Plan Note (Signed)
Continue Acyclovir Will monitor

## 2020-11-02 NOTE — Assessment & Plan Note (Signed)
She took herself off Buspar Continue Mirtazapine and Propranolol Support offered

## 2020-11-04 LAB — CBC
Hematocrit: 42.9 % (ref 34.0–46.6)
Hemoglobin: 14.5 g/dL (ref 11.1–15.9)
MCH: 30.7 pg (ref 26.6–33.0)
MCHC: 33.8 g/dL (ref 31.5–35.7)
MCV: 91 fL (ref 79–97)
Platelets: 184 10*3/uL (ref 150–450)
RBC: 4.73 x10E6/uL (ref 3.77–5.28)
RDW: 12.5 % (ref 11.7–15.4)
WBC: 6.3 10*3/uL (ref 3.4–10.8)

## 2020-11-04 LAB — COMPREHENSIVE METABOLIC PANEL
ALT: 17 IU/L (ref 0–32)
AST: 21 IU/L (ref 0–40)
Albumin/Globulin Ratio: 1.6 (ref 1.2–2.2)
Albumin: 4.4 g/dL (ref 3.8–4.8)
Alkaline Phosphatase: 86 IU/L (ref 44–121)
BUN/Creatinine Ratio: 26 (ref 12–28)
BUN: 18 mg/dL (ref 8–27)
Bilirubin Total: 0.2 mg/dL (ref 0.0–1.2)
CO2: 22 mmol/L (ref 20–29)
Calcium: 9.8 mg/dL (ref 8.7–10.3)
Chloride: 105 mmol/L (ref 96–106)
Creatinine, Ser: 0.69 mg/dL (ref 0.57–1.00)
GFR calc Af Amer: 104 mL/min/{1.73_m2} (ref >59)
GFR calc non Af Amer: 90 mL/min/{1.73_m2} (ref >59)
Globulin, Total: 2.7 g/dL (ref 1.5–4.5)
Glucose: 95 mg/dL (ref 65–99)
Potassium: 4.5 mmol/L (ref 3.5–5.2)
Sodium: 143 mmol/L (ref 134–144)
Total Protein: 7.1 g/dL (ref 6.0–8.5)

## 2020-11-04 LAB — T4, FREE: Free T4: 1.26 ng/dL (ref 0.82–1.77)

## 2020-11-04 LAB — LIPID PANEL
Chol/HDL Ratio: 3.6 ratio (ref 0.0–4.4)
Cholesterol, Total: 263 mg/dL — ABNORMAL HIGH (ref 100–199)
HDL: 73 mg/dL (ref >39)
LDL Chol Calc (NIH): 174 mg/dL — ABNORMAL HIGH (ref 0–99)
Triglycerides: 94 mg/dL (ref 0–149)
VLDL Cholesterol Cal: 16 mg/dL (ref 5–40)

## 2020-11-04 LAB — TSH: TSH: 1.86 u[IU]/mL (ref 0.450–4.500)

## 2020-11-04 LAB — VITAMIN D 25 HYDROXY (VIT D DEFICIENCY, FRACTURES): Vit D, 25-Hydroxy: 75.2 ng/mL (ref 30.0–100.0)

## 2020-11-10 ENCOUNTER — Encounter: Payer: Self-pay | Admitting: Internal Medicine

## 2020-11-10 DIAGNOSIS — E7800 Pure hypercholesterolemia, unspecified: Secondary | ICD-10-CM

## 2020-11-12 NOTE — Telephone Encounter (Signed)
Pure hypercholesteremia

## 2020-11-18 ENCOUNTER — Ambulatory Visit: Payer: Managed Care, Other (non HMO) | Admitting: Dermatology

## 2020-11-29 ENCOUNTER — Other Ambulatory Visit: Payer: Self-pay | Admitting: Internal Medicine

## 2020-11-29 DIAGNOSIS — Z1231 Encounter for screening mammogram for malignant neoplasm of breast: Secondary | ICD-10-CM

## 2020-12-25 ENCOUNTER — Encounter: Payer: Self-pay | Admitting: Internal Medicine

## 2020-12-26 ENCOUNTER — Other Ambulatory Visit: Payer: Self-pay | Admitting: Internal Medicine

## 2020-12-28 ENCOUNTER — Other Ambulatory Visit: Payer: Self-pay

## 2020-12-28 ENCOUNTER — Ambulatory Visit (INDEPENDENT_AMBULATORY_CARE_PROVIDER_SITE_OTHER): Payer: Self-pay | Admitting: Dermatology

## 2020-12-28 DIAGNOSIS — L988 Other specified disorders of the skin and subcutaneous tissue: Secondary | ICD-10-CM

## 2020-12-28 NOTE — Progress Notes (Unsigned)
   Follow-Up Visit   Subjective  [name] is a 70 y.o. female who presents for the following: Facial Elastosis (Face, pt presents for Botox today, last visit 09/07/20).  The following portions of the chart were reviewed this encounter and updated as appropriate:   Tobacco  Allergies  Meds  Problems  Med Hx  Surg Hx  Fam Hx     Review of Systems:  No other skin or systemic complaints except as noted in HPI or Assessment and Plan.  Objective  Well appearing patient in no apparent distress; mood and affect are within normal limits.  A focused examination was performed including face. Relevant physical exam findings are noted in the Assessment and Plan.  Objective  Head - Anterior (Face): Rhytides and volume loss.   Images     Assessment & Plan  Elastosis of skin Head - Anterior (Face)  Botox 20 unitis injected to: - Frown complex  Botox Injection - Head - Anterior (Face) Location: Frown complex  Informed consent: Discussed risks (infection, pain, bleeding, bruising, swelling, allergic reaction, paralysis of nearby muscles, eyelid droop, double vision, neck weakness, difficulty breathing, headache, undesirable cosmetic result, and need for additional treatment) and benefits of the procedure, as well as the alternatives.  Informed consent was obtained.  Preparation: The area was cleansed with alcohol.  Procedure Details:  Botox was injected into the dermis with a 30-gauge needle. Pressure applied to any bleeding. Ice packs offered for swelling.  Lot Number:  [id number] Expiration:  02/2023  Total Units Injected:  20  Plan: Patient was instructed to remain upright for 4 hours. Patient was instructed to avoid massaging the face and avoid vigorous exercise for the rest of the day. Tylenol may be used for headache.  Allow 2 weeks before returning to clinic for additional dosing as needed. Patient will call for any problems.   Return for 3-54m Botox.   I, Othelia Pulling,  RMA, am acting as scribe for Sarina Ser, MD .  Documentation: I have reviewed the above documentation for accuracy and completeness, and I agree with the above.  Sarina Ser, MD

## 2020-12-29 ENCOUNTER — Encounter: Payer: Self-pay | Admitting: Dermatology

## 2020-12-29 MED ORDER — LEVOTHYROXINE SODIUM 75 MCG PO TABS
75.0000 ug | ORAL_TABLET | Freq: Every day | ORAL | 0 refills | Status: DC
Start: 2020-12-29 — End: 2020-12-29

## 2020-12-29 MED ORDER — LEVOTHYROXINE SODIUM 75 MCG PO TABS
75.0000 ug | ORAL_TABLET | Freq: Every day | ORAL | 0 refills | Status: DC
Start: 2020-12-29 — End: 2021-05-12

## 2020-12-29 NOTE — Addendum Note (Signed)
Addended by: Lurlean Nanny on: 12/29/2020 11:03 AM   Modules accepted: Orders

## 2020-12-29 NOTE — Addendum Note (Signed)
Addended by: Lurlean Nanny on: 12/29/2020 09:31 AM   Modules accepted: Orders

## 2020-12-31 ENCOUNTER — Ambulatory Visit
Admission: RE | Admit: 2020-12-31 | Discharge: 2020-12-31 | Disposition: A | Discharge status: Home / Self Care | Payer: Managed Care, Other (non HMO) | Source: Ambulatory Visit | Attending: Internal Medicine | Admitting: Internal Medicine

## 2020-12-31 ENCOUNTER — Other Ambulatory Visit: Payer: Self-pay

## 2020-12-31 DIAGNOSIS — Z1231 Encounter for screening mammogram for malignant neoplasm of breast: Secondary | ICD-10-CM | POA: Insufficient documentation

## 2021-01-12 ENCOUNTER — Other Ambulatory Visit: Payer: Self-pay

## 2021-01-12 ENCOUNTER — Encounter: Payer: Self-pay | Admitting: Psychiatry

## 2021-01-12 ENCOUNTER — Telehealth (INDEPENDENT_AMBULATORY_CARE_PROVIDER_SITE_OTHER): Payer: 59 | Admitting: Psychiatry

## 2021-01-12 DIAGNOSIS — F411 Generalized anxiety disorder: Secondary | ICD-10-CM

## 2021-01-12 DIAGNOSIS — F4312 Post-traumatic stress disorder, chronic: Secondary | ICD-10-CM | POA: Diagnosis not present

## 2021-01-12 MED ORDER — GABAPENTIN 300 MG PO CAPS: 300.0000 mg | ORAL_CAPSULE | Freq: Two times a day (BID) | ORAL | 3 refills | Status: DC

## 2021-01-12 MED ORDER — MIRTAZAPINE 7.5 MG PO TABS: 7.5000 mg | ORAL_TABLET | Freq: Every day | ORAL | 3 refills | Status: DC

## 2021-01-12 NOTE — Progress Notes (Signed)
Virtual Visit via Video Note  I connected with [name] on 01/12/21 at  2:00 PM EST by a video enabled telemedicine application and verified that I am speaking with the correct person using two identifiers.  Location Provider Location : [facility] Patient Location : Home  Participants: Patient , Provider    I discussed the limitations of evaluation and management by telemedicine and the availability of in person appointments. The patient expressed understanding and agreed to proceed.    I discussed the assessment and treatment plan with the patient. The patient was provided an opportunity to ask questions and all were answered. The patient agreed with the plan and demonstrated an understanding of the instructions.   The patient was advised to call back or seek an in-person evaluation if the symptoms worsen or if the condition fails to improve as anticipated.   [place] MD OP Progress Note  01/12/2021 3:13 PM [name]  MRN:  [id number]  Chief Complaint:  Chief Complaint    Follow-up     HPI: [name] is a 70 year old Caucasian female, retired, married, lives in [place], has a history of PTSD, GAD was evaluated by telemedicine today.  Patient today reports she is currently staying busy.  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  She also spends time with her grandson who comes over to visit frequently.  She reports she has bought a second home at [place] for herself and has been moving back and forth a lot.  She reports her husband recently had medical problems however is currently recovering.  She reports the holidays went well.  She does have anxiety symptoms on and off however she reports her current medications as beneficial.  She takes the propranolol 2-3 times a week when she feels nervous and that calms her down.  She denies any side effects to any of her medications.  She reports sleep is good and the mirtazapine is  beneficial.  Patient denies any suicidality, homicidality or perceptual disturbances.  Patient reports she continues to be in psychotherapy sessions and therapy sessions as beneficial.  She follows up with her therapist every 4 weeks or so.  She denies any suicidality, homicidality or perceptual disturbances.  Patient denies any other concerns today.  Visit Diagnosis:    ICD-10-CM   1. GAD (generalized anxiety disorder)  F41.1 mirtazapine (REMERON) 7.5 MG tablet    gabapentin (NEURONTIN) 300 MG capsule  2. Chronic post-traumatic stress disorder (PTSD)  F43.12     Past Psychiatric History: I have reviewed past psychiatric history from my progress note on 03/11/2018.  Past Medical History:  Past Medical History:  Diagnosis Date  . Anxiety   . Depression   . Thyroid disease     Past Surgical History:  Procedure Laterality Date  . ABDOMINAL HYSTERECTOMY  1992   Total  . BREAST BIOPSY Right 07/20/2015   neg fat necrosis  . BREAST EXCISIONAL BIOPSY Right 1960's   negative. No scar seen  . CERVICAL FUSION  2005,2010   C2-C5  . REDUCTION MAMMAPLASTY Bilateral 2016  . TONSILLECTOMY  1961    Family Psychiatric History: I have reviewed family psychiatric history from my progress note on 03/11/2018.  Family History:  Family History  Problem Relation Age of Onset  . Cancer Mother        Uterine, Ovarian  . Anxiety disorder Mother   . Depression Mother   . Heart disease Father   . Stroke Father   .  Diabetes Father   . Cancer Maternal Grandmother   . ADD / ADHD Daughter   . ADD / ADHD Grandchild   . Prostate cancer Neg Hx   . Kidney cancer Neg Hx   . Breast cancer Neg Hx     Social History: I have reviewed social history from my progress note on 03/11/2018. Social History   Socioeconomic History  . Marital status: Married    Spouse name: jeff  . Number of children: 2  . Years of education: Not on file  . Highest education level: Master's degree (e.g., MA, MS, MEng, MEd,  MSW, MBA)  Occupational History    Comment: retired  Tobacco Use  . Smoking status: Never Smoker  . Smokeless tobacco: Never Used  Vaping Use  . Vaping Use: Never used  Substance and Sexual Activity  . Alcohol use: Yes    Alcohol/week: 1.0 standard drink    Types: 1 Glasses of wine per week    Comment: 2-3 nights week--1 glass of wine  . Drug use: No  . Sexual activity: Yes    Birth control/protection: None  Other Topics Concern  . Not on file  Social History Narrative  . Not on file   Social Determinants of Health   Financial Resource Strain: Not on file  Food Insecurity: Not on file  Transportation Needs: Not on file  Physical Activity: Not on file  Stress: Not on file  Social Connections: Not on file    Allergies:  Allergies  Allergen Reactions  . Codeine Hives  . Iodinated Diagnostic Agents Itching  . Metrizamide Itching    Metabolic Disorder Labs: No results found for: HGBA1C, MPG No results found for: PROLACTIN Lab Results  Component Value Date   CHOL 263 (H) 11/02/2020   TRIG 94 11/02/2020   HDL 73 11/02/2020   CHOLHDL 3.6 11/02/2020   LDLCALC 174 (H) 11/02/2020   LDLCALC 150 (H) 10/30/2019   Lab Results  Component Value Date   TSH 1.860 11/02/2020   TSH 1.440 10/30/2019    Therapeutic Level Labs: No results found for: LITHIUM No results found for: VALPROATE No components found for:  CBMZ  Current Medications: Current Outpatient Medications  Medication Sig Dispense Refill  . acyclovir (ZOVIRAX) 200 MG capsule TAKE 1 CAPSULE BY MOUTH  DAILY 90 capsule 0  . Biotin (BIOTIN 5000) 5 MG CAPS Take by mouth.    . Cholecalciferol (VITAMIN D3) 50 MCG (2000 UT) capsule Take by mouth.    . doxycycline (MONODOX) 50 MG capsule 2 (two) times daily.   2  . gabapentin (NEURONTIN) 300 MG capsule Take 1 capsule (300 mg total) by mouth 2 (two) times daily. 180 capsule 3  . ibuprofen (ADVIL,MOTRIN) 200 MG tablet Take 200 mg by mouth every 6 (six) hours as  needed for moderate pain.    Marland Kitchen levothyroxine (SYNTHROID) 75 MCG tablet Take 1 tablet (75 mcg total) by mouth daily before breakfast. 30 tablet 0  . mirtazapine (REMERON) 7.5 MG tablet Take 1 tablet (7.5 mg total) by mouth at bedtime. 90 tablet 3  . Multiple Vitamins-Minerals (OCUVITE ADULT 50+ PO) Take by mouth.    . nitrofurantoin, macrocrystal-monohydrate, (MACROBID) 100 MG capsule Take 100 mg by mouth every 12 (twelve) hours.    . Omega-3 Fatty Acids (FISH OIL) 1000 MG CAPS Take by mouth.    . propranolol (INDERAL) 10 MG tablet TAKE 1 TABLET BY MOUTH 3  TIMES DAILY AS NEEDED 270 tablet 3  . solifenacin (VESICARE)  10 MG tablet Take 0.5 tablets (5 mg total) by mouth daily.    Marland Kitchen White Petrolatum-Mineral Oil (GENTEAL TEARS NIGHT-TIME) OINT Apply to eye.     No current facility-administered medications for this visit.     Musculoskeletal: Strength & Muscle Tone: UTA Gait & Station: UTA Patient leans: N/A  Psychiatric Specialty Exam: Review of Systems  Psychiatric/Behavioral: The patient is nervous/anxious.   All other systems reviewed and are negative.   There were no vitals taken for this visit.There is no height or weight on file to calculate BMI.  General Appearance: Casual  Eye Contact:  Fair  Speech:  Clear and Coherent  Volume:  Normal  Mood:  Anxious Coping well  Affect:  Congruent  Thought Process:  Goal Directed and Descriptions of Associations: Intact  Orientation:  Full (Time, Place, and Person)  Thought Content: Logical   Suicidal Thoughts:  No  Homicidal Thoughts:  No  Memory:  Immediate;   Fair Recent;   Fair Remote;   Fair  Judgement:  Fair  Insight:  Fair  Psychomotor Activity:  Normal  Concentration:  Concentration: Fair and Attention Span: Fair  Recall:  [employer] of Knowledge: Fair  Language: Fair  Akathisia:  No  Handed:  Right  AIMS (if indicated): UTA  Assets:  Communication Skills Desire for Improvement Housing Social Support  ADL's:  Intact   Cognition: WNL  Sleep:  Fair   Screenings: GAD-7   Flowsheet Row Video Visit from 01/12/2021 in [place] Visit from 11/02/2020 in [place] at [facility] Visit from 08/14/2019 in [place]  Total GAD-7 Score 3 0 5    PHQ2-9   [place] Visit from 11/02/2020 in [place] at [facility] Visit from 10/30/2019 in [place] at [facility] Visit from 10/09/2016 in [place] at [facility] Visit from 04/18/2016 in [place] at [facility] Visit from 07/30/2014 in [place] at [facility]  PHQ-2 Total Score 0 0 0 0 0  PHQ-9 Total Score 0 - - - -       Assessment and Plan: Tashaya [name] is a 71 year old Caucasian female who is married, retired, lives in [place], has a history of PTSD, anxiety disorder, thyroid problem was evaluated by telemedicine today.  Patient is currently stable on current medication regimen.  Plan as noted below.  Plan GAD-stable Gabapentin 300 mg 2 times daily. Mirtazapine 7.5 mg p.o. nightly for sleep Propranolol 10 mg p.o. 3 times daily as needed  PTSD-stable Continue CBT with [name] Continue mirtazapine 7.5 mg p.o. nightly for sleep  I have reviewed TSH labs-dated 11/02/2020-1.860-within normal limits.  Follow-up in clinic in 5 to 6 months or sooner if needed.  I have spent atleast 20 minutes face to face by video with patient today. More than 50 % of the time was spent for preparing to see the patient ( e.g., review of test, records ), ordering medications and test ,psychoeducation and supportive psychotherapy and care coordination,as well as documenting clinical information in electronic health record. This note was generated in part or whole with voice recognition software. Voice recognition is usually quite accurate but there are transcription errors that can and very often do  occur. I apologize for any typographical errors that were not detected and corrected.       Ursula Alert, MD 01/12/2021, 3:13 PM

## 2021-02-10 ENCOUNTER — Other Ambulatory Visit: Payer: Self-pay

## 2021-02-10 ENCOUNTER — Other Ambulatory Visit (INDEPENDENT_AMBULATORY_CARE_PROVIDER_SITE_OTHER): Payer: Managed Care, Other (non HMO)

## 2021-02-10 DIAGNOSIS — E7800 Pure hypercholesterolemia, unspecified: Secondary | ICD-10-CM | POA: Diagnosis not present

## 2021-02-10 NOTE — Addendum Note (Signed)
Addended by: Ellamae Sia on: 02/10/2021 08:54 AM   Modules accepted: Orders

## 2021-02-11 LAB — LIPID PANEL
Chol/HDL Ratio: 3.8 ratio (ref 0.0–4.4)
Cholesterol, Total: 243 mg/dL — ABNORMAL HIGH (ref 100–199)
HDL: 64 mg/dL (ref >39)
LDL Chol Calc (NIH): 162 mg/dL — ABNORMAL HIGH (ref 0–99)
Triglycerides: 97 mg/dL (ref 0–149)
VLDL Cholesterol Cal: 17 mg/dL (ref 5–40)

## 2021-04-11 ENCOUNTER — Telehealth: Payer: Self-pay

## 2021-04-11 NOTE — Telephone Encounter (Signed)
error 

## 2021-04-25 ENCOUNTER — Telehealth: Payer: Self-pay

## 2021-04-25 MED ORDER — SOLIFENACIN SUCCINATE 10 MG PO TABS: 5.0000 mg | ORAL_TABLET | Freq: Every day | ORAL | 3 refills | Status: DC

## 2021-04-25 NOTE — Telephone Encounter (Signed)
Med refilled.  Pt aware. 

## 2021-05-08 ENCOUNTER — Other Ambulatory Visit: Payer: Self-pay | Admitting: Internal Medicine

## 2021-05-11 NOTE — Telephone Encounter (Signed)
Refill request Synthroid Last office visit 11/02/20 Last refill 12/29/20 #30

## 2021-07-18 ENCOUNTER — Ambulatory Visit: Payer: Managed Care, Other (non HMO) | Admitting: Urology

## 2021-07-18 ENCOUNTER — Other Ambulatory Visit: Payer: Self-pay

## 2021-07-18 VITALS — BP 109/67 | HR 59 | Ht 65.0 in | Wt 138.0 lb

## 2021-07-18 DIAGNOSIS — N3946 Mixed incontinence: Secondary | ICD-10-CM

## 2021-07-18 NOTE — Progress Notes (Signed)
07/18/2021 9:55 AM   [name] November 15, 1951 [id number]  Referring provider: Jearld Fenton, NP [address] [place],  [place] [address]  No chief complaint on file.   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 She can leak with coughing sneezing and sometimes with bending and lifting and laughing. She only wears a pad when she was out in public. She denies enuresis.   She is currently on oxybutynin and failed.   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 No stress incontinence or prolapse   The patient has mild stress incontinence but primarily is bothered by urgency and almost daily urge incontinence. The patient was dramatically better on Vesicare almost completely dry on her last visit. Subsequently it was increased to Vesicare 10 mg   She brakes to 10 mg tablet and have to cutter cost by 50%. She does not believe the medication makes her eye dry which is a chronic problem but wanted to make certain it is not aggravated    Continues to be dry on Vesicare 10 mg and cuts the tablets.  903 sent.  I will see her in 2 years and she will call.    TOday Frequency stable.  Urge incontinence much better.  Still takes half a tablet but might use a full tablet she goes for 2-hour walk.  No infection  PMH: Past Medical History:  Diagnosis Date   Anxiety    Depression    Thyroid disease     Surgical History: Past Surgical History:  Procedure Laterality Date   ABDOMINAL HYSTERECTOMY  1992   Total   BREAST BIOPSY Right 07/20/2015   neg fat necrosis   BREAST EXCISIONAL BIOPSY Right 1960's   negative. No scar seen   CERVICAL FUSION  2005,2010   C2-C5   REDUCTION MAMMAPLASTY Bilateral 2016   TONSILLECTOMY  1961    Home Medications:  Allergies as of 07/18/2021       Reactions   Codeine Hives   Iodinated Diagnostic Agents Itching   Metrizamide Itching        Medication List        Accurate as of July 18, 2021  9:55 AM. If  you have any questions, ask your nurse or doctor.          acyclovir 200 MG capsule Commonly known as: ZOVIRAX TAKE 1 CAPSULE BY MOUTH  DAILY   Biotin 5000 5 MG Caps Generic drug: Biotin Take by mouth.   doxycycline 50 MG capsule Commonly known as: MONODOX 2 (two) times daily.   Fish Oil 1000 MG Caps Take by mouth.   gabapentin 300 MG capsule Commonly known as: NEURONTIN Take 1 capsule (300 mg total) by mouth 2 (two) times daily.   GenTeal Tears Night-Time Oint Apply to eye.   ibuprofen 200 MG tablet Commonly known as: ADVIL Take 200 mg by mouth every 6 (six) hours as needed for moderate pain.   levothyroxine 75 MCG tablet Commonly known as: SYNTHROID TAKE 1 TABLET BY MOUTH  DAILY BEFORE BREAKFAST   mirtazapine 7.5 MG tablet Commonly known as: REMERON Take 1 tablet (7.5 mg total) by mouth at bedtime.   nitrofurantoin (macrocrystal-monohydrate) 100 MG capsule Commonly known as: MACROBID Take 100 mg by mouth every 12 (twelve) hours.   OCUVITE ADULT 50+ PO Take by mouth.   propranolol 10 MG tablet Commonly known as: INDERAL TAKE 1 TABLET BY MOUTH 3  TIMES DAILY  AS NEEDED   solifenacin 10 MG tablet Commonly known as: VESICARE Take 0.5 tablets (5 mg total) by mouth daily.   Vitamin D3 50 MCG (2000 UT) capsule Take by mouth.        Allergies:  Allergies  Allergen Reactions   Codeine Hives   Iodinated Diagnostic Agents Itching   Metrizamide Itching    Family History: Family History  Problem Relation Age of Onset   Cancer Mother        Uterine, Ovarian   Anxiety disorder Mother    Depression Mother    Heart disease Father    Stroke Father    Diabetes Father    Cancer Maternal Grandmother    ADD / ADHD Daughter    ADD / ADHD Grandchild    Prostate cancer Neg Hx    Kidney cancer Neg Hx    Breast cancer Neg Hx     Social History:  reports that she has never smoked. She has never used smokeless tobacco. She reports current alcohol use of  about 1.0 standard drink of alcohol per week. She reports that she does not use drugs.  ROS:                                        Physical Exam: There were no vitals taken for this visit.  Constitutional:  Alert and oriented, No acute distress.  Laboratory Data: Lab Results  Component Value Date   WBC 6.3 11/02/2020   HGB 14.5 11/02/2020   HCT 42.9 11/02/2020   MCV 91 11/02/2020   PLT 184 11/02/2020    Lab Results  Component Value Date   CREATININE 0.69 11/02/2020    No results found for: PSA  No results found for: TESTOSTERONE  No results found for: HGBA1C  Urinalysis    Component Value Date/Time   COLORURINE AMBER (A) 03/08/2017 1537   APPEARANCEUR CLEAR 03/08/2017 1537   APPEARANCEUR Hazy (A) 06/26/2016 0934   LABSPEC 1.023 03/08/2017 1537   PHURINE 6.0 03/08/2017 1537   GLUCOSEU NEGATIVE 03/08/2017 1537   HGBUR NEGATIVE 03/08/2017 1537   BILIRUBINUR NEGATIVE 03/08/2017 1537   BILIRUBINUR Negative 06/26/2016 0934   KETONESUR 5 (A) 03/08/2017 1537   PROTEINUR 30 (A) 03/08/2017 1537   UROBILINOGEN  03/20/2016 1043     Comment:     Unable to read due to pt taking AZO   UROBILINOGEN 1.0 04/29/2010 0749   NITRITE NEGATIVE 03/08/2017 1537   LEUKOCYTESUR NEGATIVE 03/08/2017 1537   LEUKOCYTESUR Trace (A) 06/26/2016 0934    Pertinent Imaging:   Assessment & Plan: Prescription renewed 90x3 and I will see in 2 years.  There are no diagnoses linked to this encounter.  No follow-ups on file.  Reece Packer, MD  [facility] [address], [place], [place] [address] [phone]

## 2021-07-21 ENCOUNTER — Telehealth (INDEPENDENT_AMBULATORY_CARE_PROVIDER_SITE_OTHER): Payer: 59 | Admitting: Psychiatry

## 2021-07-21 ENCOUNTER — Encounter: Payer: Self-pay | Admitting: Psychiatry

## 2021-07-21 ENCOUNTER — Other Ambulatory Visit: Payer: Self-pay

## 2021-07-21 DIAGNOSIS — F411 Generalized anxiety disorder: Secondary | ICD-10-CM | POA: Diagnosis not present

## 2021-07-21 DIAGNOSIS — F4312 Post-traumatic stress disorder, chronic: Secondary | ICD-10-CM

## 2021-07-21 NOTE — Progress Notes (Signed)
Virtual Visit via Video Note  I connected with [name] on 07/21/21 at  2:00 PM EDT by a video enabled telemedicine application and verified that I am speaking with the correct person using two identifiers.  Location Provider Location : [facility] Patient Location : Home  Participants: Patient , Provider    I discussed the limitations of evaluation and management by telemedicine and the availability of in person appointments. The patient expressed understanding and agreed to proceed.   I discussed the assessment and treatment plan with the patient. The patient was provided an opportunity to ask questions and all were answered. The patient agreed with the plan and demonstrated an understanding of the instructions.   The patient was advised to call back or seek an in-person evaluation if the symptoms worsen or if the condition fails to improve as anticipated.   [place] [facility] MD OP Progress Note  07/21/2021 7:33 PM [name]  MRN:  [id number]  Chief Complaint:  Chief Complaint   Follow-up; Anxiety    HPI: [name] is a 70 year old Caucasian female, retired, married, lives originally in [place], currently is at [place], [place], has a history of PTSD, GAD was evaluated by telemedicine today.  Patient today reports she is currently doing well.  She reports her mood symptoms are stable.  She reports she is compliant on the mirtazapine and needs the propranolol 1-2 times a day as needed when she has anxiety.  She takes it prior to going to a dinner party or if she has something else going on that could make her anxious however does not take it much.  She reports she forgot to take the mirtazapine a few times and those nights she could not sleep and she is currently taking the mirtazapine regularly.  Sleep is good.  She was able to taper herself off of the gabapentin.  She stopped taking the gabapentin 6 weeks ago.  She reports she is doing well without it so far.  She denies any  suicidality, homicidality or perceptual disturbances.  Patient denies any other concerns today.  Visit Diagnosis:    ICD-10-CM   1. GAD (generalized anxiety disorder)  F41.1     2. Chronic post-traumatic stress disorder (PTSD)  F43.12       Past Psychiatric History: I have reviewed past psychiatric history from my progress note on 03/11/2018  Past Medical History:  Past Medical History:  Diagnosis Date   Anxiety    Depression    Thyroid disease     Past Surgical History:  Procedure Laterality Date   ABDOMINAL HYSTERECTOMY  1992   Total   BREAST BIOPSY Right 07/20/2015   neg fat necrosis   BREAST EXCISIONAL BIOPSY Right 1960's   negative. No scar seen   CERVICAL FUSION  2005,2010   C2-C5   REDUCTION MAMMAPLASTY Bilateral 2016   TONSILLECTOMY  1961    Family Psychiatric History: Reviewed family psychiatric history from progress note on 03/11/2018  Family History:  Family History  Problem Relation Age of Onset   Cancer Mother        Uterine, Ovarian   Anxiety disorder Mother    Depression Mother    Heart disease Father    Stroke Father    Diabetes Father    Cancer Maternal Grandmother    ADD / ADHD Daughter    ADD / ADHD Grandchild    Prostate cancer Neg Hx    Kidney cancer Neg Hx    Breast cancer Neg Hx  Social History: Reviewed social history from progress note on 03/11/2018 Social History   Socioeconomic History   Marital status: Married    Spouse name: jeff   Number of children: 2   Years of education: Not on file   Highest education level: Master's degree (e.g., MA, MS, MEng, MEd, MSW, MBA)  Occupational History    Comment: retired  Tobacco Use   Smoking status: Never   Smokeless tobacco: Never  Vaping Use   Vaping Use: Never used  Substance and Sexual Activity   Alcohol use: Yes    Alcohol/week: 1.0 standard drink    Types: 1 Glasses of wine per week    Comment: 2-3 nights week--1 glass of wine   Drug use: No   Sexual activity: Yes    Birth  control/protection: None  Other Topics Concern   Not on file  Social History Narrative   Not on file   Social Determinants of Health   Financial Resource Strain: Not on file  Food Insecurity: Not on file  Transportation Needs: Not on file  Physical Activity: Not on file  Stress: Not on file  Social Connections: Not on file    Allergies:  Allergies  Allergen Reactions   Codeine Hives   Iodinated Diagnostic Agents Itching   Metrizamide Itching    Metabolic Disorder Labs: No results found for: HGBA1C, MPG No results found for: PROLACTIN Lab Results  Component Value Date   CHOL 243 (H) 02/10/2021   TRIG 97 02/10/2021   HDL 64 02/10/2021   CHOLHDL 3.8 02/10/2021   LDLCALC 162 (H) 02/10/2021   LDLCALC 174 (H) 11/02/2020   Lab Results  Component Value Date   TSH 1.860 11/02/2020   TSH 1.440 10/30/2019    Therapeutic Level Labs: No results found for: LITHIUM No results found for: VALPROATE No components found for:  CBMZ  Current Medications: Current Outpatient Medications  Medication Sig Dispense Refill   acyclovir (ZOVIRAX) 200 MG capsule TAKE 1 CAPSULE BY MOUTH  DAILY 90 capsule 0   Biotin 5 MG CAPS Take by mouth.     Cholecalciferol (VITAMIN D3) 50 MCG (2000 UT) capsule Take by mouth.     ibuprofen (ADVIL,MOTRIN) 200 MG tablet Take 200 mg by mouth every 6 (six) hours as needed for moderate pain.     levothyroxine (SYNTHROID) 75 MCG tablet TAKE 1 TABLET BY MOUTH  DAILY BEFORE BREAKFAST 30 tablet 11   mirtazapine (REMERON) 7.5 MG tablet Take 1 tablet (7.5 mg total) by mouth at bedtime. 90 tablet 3   Multiple Vitamins-Minerals (OCUVITE ADULT 50+ PO) Take by mouth.     Omega-3 Fatty Acids (FISH OIL) 1000 MG CAPS Take by mouth.     propranolol (INDERAL) 10 MG tablet TAKE 1 TABLET BY MOUTH 3  TIMES DAILY AS NEEDED 270 tablet 3   solifenacin (VESICARE) 10 MG tablet Take 0.5 tablets (5 mg total) by mouth daily. 90 tablet 3   White Petrolatum-Mineral Oil (GENTEAL TEARS  NIGHT-TIME) OINT Apply to eye.     No current facility-administered medications for this visit.     Musculoskeletal: Strength & Muscle Tone:  UTA Gait & Station:  Seated Patient leans: N/A  Psychiatric Specialty Exam: Review of Systems  Musculoskeletal:  Positive for neck pain.       BL shoulder pain - chronic  Psychiatric/Behavioral:  Negative for agitation, behavioral problems, confusion, decreased concentration, dysphoric mood, hallucinations, self-injury, sleep disturbance and suicidal ideas. The patient is not nervous/anxious and is not hyperactive.  All other systems reviewed and are negative.  There were no vitals taken for this visit.There is no height or weight on file to calculate BMI.  General Appearance: Casual  Eye Contact:  Fair  Speech:  Clear and Coherent  Volume:  Normal  Mood:  Euthymic  Affect:  Congruent  Thought Process:  Goal Directed and Descriptions of Associations: Intact  Orientation:  Full (Time, Place, and Person)  Thought Content: Logical   Suicidal Thoughts:  No  Homicidal Thoughts:  No  Memory:  Immediate;   Fair Recent;   Fair Remote;   Fair  Judgement:  Fair  Insight:  Good  Psychomotor Activity:  Normal  Concentration:  Concentration: Fair and Attention Span: Fair  Recall:  [employer] of Knowledge: Fair  Language: Fair  Akathisia:  No  Handed:  Right  AIMS (if indicated): not done  Assets:  Communication Skills Desire for Improvement Financial Resources/Insurance Housing Intimacy Leisure Time Resilience Social Support Talents/Skills Transportation Vocational/Educational  ADL's:  Intact  Cognition: WNL  Sleep:  Fair   Screenings: GAD-7    Flowsheet Row Video Visit from 07/21/2021 in [facility] Video Visit from 01/12/2021 in [facility] Visit from 11/02/2020 in [facility] at [facility] Visit from 08/14/2019 in [facility]  Total GAD-7 Score 3 3 0 5      PHQ2-9    Flowsheet Row Video Visit from 07/21/2021 in [place] Visit from 11/02/2020 in [facility] at [facility] Visit from 10/30/2019 in [facility] at [facility] Visit from 10/09/2016 in [facility] at [place] [facility] from 04/18/2016 in [place] at [facility]  PHQ-2 Total Score 0 0 0 0 0  PHQ-9 Total Score -- 0 -- -- --      Flowsheet Row Video Visit from 07/21/2021 in [facility] No Risk        Assessment and Plan: [name] is a 70 year old Caucasian female who is married, retired, lives in [place], has a history of PTSD, anxiety disorder was evaluated by telemedicine today.  Patient is currently stable and was able to taper herself off of the gabapentin.  Plan as noted below.  Plan GAD-stable Discontinue gabapentin . Mirtazapine 7.5 mg p.o. nightly for sleep Propranolol 10 mg p.o. 3 times daily as needed Discussed with patient she could add low-dose melatonin and if she has a good response she could start taking the mirtazapine as needed.  PTSD-stable Continue mirtazapine 7.5 mg p.o. nightly for sleep  Follow-up in clinic in 6 months or sooner if needed.   This note was generated in part or whole with voice recognition software. Voice recognition is usually quite accurate but there are transcription errors that can and very often do occur. I apologize for any typographical errors that were not detected and corrected.     Ursula Alert, MD 07/21/2021, 7:33 PM

## 2021-07-25 ENCOUNTER — Ambulatory Visit: Payer: Managed Care, Other (non HMO) | Admitting: Dermatology

## 2021-07-28 ENCOUNTER — Telehealth: Payer: 59 | Admitting: Psychiatry

## 2021-08-01 ENCOUNTER — Ambulatory Visit: Payer: Managed Care, Other (non HMO) | Admitting: Dermatology

## 2021-08-02 ENCOUNTER — Encounter: Payer: Self-pay | Admitting: Dermatology

## 2021-08-02 ENCOUNTER — Other Ambulatory Visit: Payer: Self-pay

## 2021-08-02 ENCOUNTER — Ambulatory Visit (INDEPENDENT_AMBULATORY_CARE_PROVIDER_SITE_OTHER): Payer: Self-pay | Admitting: Dermatology

## 2021-08-02 DIAGNOSIS — L988 Other specified disorders of the skin and subcutaneous tissue: Secondary | ICD-10-CM

## 2021-08-02 NOTE — Patient Instructions (Signed)

## 2021-08-02 NOTE — Progress Notes (Signed)
   Follow-Up Visit   Subjective  [name] is a 70 y.o. female who presents for the following: Facial Elastosis (Face, pt presents for Botox today).  The following portions of the chart were reviewed this encounter and updated as appropriate:   Tobacco  Allergies  Meds  Problems  Med Hx  Surg Hx  Fam Hx     Review of Systems:  No other skin or systemic complaints except as noted in HPI or Assessment and Plan.  Objective  Well appearing patient in no apparent distress; mood and affect are within normal limits.  A focused examination was performed including face. Relevant physical exam findings are noted in the Assessment and Plan.  Head - Anterior (Face) Rhytides and volume loss.       Assessment & Plan  Elastosis of skin Head - Anterior (Face)  Botox 20 units injected today to:  - Frown complex 20 units  Filling material injection - Head - Anterior (Face) Location: Frown complex  Informed consent: Discussed risks (infection, pain, bleeding, bruising, swelling, allergic reaction, paralysis of nearby muscles, eyelid droop, double vision, neck weakness, difficulty breathing, headache, undesirable cosmetic result, and need for additional treatment) and benefits of the procedure, as well as the alternatives.  Informed consent was obtained.  Preparation: The area was cleansed with alcohol.  Procedure Details:  Botox was injected into the dermis with a 30-gauge needle. Pressure applied to any bleeding. Ice packs offered for swelling.  Lot Number:  [id number] Expiration:  05/2023  Total Units Injected:  20  Plan: Patient was instructed to remain upright for 4 hours. Patient was instructed to avoid massaging the face and avoid vigorous exercise for the rest of the day. Tylenol may be used for headache.  Allow 2 weeks before returning to clinic for additional dosing as needed. Patient will call for any problems.   Return for 3-23mBotox.  I, SOthelia Pulling RMA, am acting  as scribe for DSarina Ser MD . Documentation: I have reviewed the above documentation for accuracy and completeness, and I agree with the above.  DSarina Ser MD

## 2021-08-05 ENCOUNTER — Other Ambulatory Visit: Payer: Self-pay | Admitting: Psychiatry

## 2021-08-05 DIAGNOSIS — F411 Generalized anxiety disorder: Secondary | ICD-10-CM

## 2021-08-30 ENCOUNTER — Ambulatory Visit: Payer: Managed Care, Other (non HMO) | Admitting: Dermatology

## 2021-09-06 ENCOUNTER — Telehealth: Payer: Self-pay | Admitting: Internal Medicine

## 2021-09-06 DIAGNOSIS — E7800 Pure hypercholesterolemia, unspecified: Secondary | ICD-10-CM

## 2021-09-06 DIAGNOSIS — Z1321 Encounter for screening for nutritional disorder: Secondary | ICD-10-CM

## 2021-09-06 DIAGNOSIS — E039 Hypothyroidism, unspecified: Secondary | ICD-10-CM

## 2021-09-06 NOTE — Telephone Encounter (Signed)
Pt call to schedule CPE would like to have labs done at [facility] wants wait to establish care with female Provider

## 2021-09-07 NOTE — Telephone Encounter (Signed)
Patient wanted to have CPE done before new NP starts and wants labs done at [facility] prior to visit.

## 2021-09-11 NOTE — Telephone Encounter (Signed)
Letter done for patient.  Please send to patient to get labs done.  Thanks.

## 2021-09-13 NOTE — Telephone Encounter (Signed)
Letter printed and mailed to patient; patient has been notified.

## 2021-10-05 ENCOUNTER — Other Ambulatory Visit: Payer: Self-pay

## 2021-10-05 ENCOUNTER — Encounter: Payer: Self-pay | Admitting: Dermatology

## 2021-10-05 ENCOUNTER — Ambulatory Visit (INDEPENDENT_AMBULATORY_CARE_PROVIDER_SITE_OTHER): Payer: Managed Care, Other (non HMO) | Admitting: Dermatology

## 2021-10-05 DIAGNOSIS — L821 Other seborrheic keratosis: Secondary | ICD-10-CM

## 2021-10-05 DIAGNOSIS — L814 Other melanin hyperpigmentation: Secondary | ICD-10-CM

## 2021-10-05 DIAGNOSIS — Z1283 Encounter for screening for malignant neoplasm of skin: Secondary | ICD-10-CM

## 2021-10-05 DIAGNOSIS — Z85828 Personal history of other malignant neoplasm of skin: Secondary | ICD-10-CM | POA: Diagnosis not present

## 2021-10-05 DIAGNOSIS — L578 Other skin changes due to chronic exposure to nonionizing radiation: Secondary | ICD-10-CM

## 2021-10-05 DIAGNOSIS — L910 Hypertrophic scar: Secondary | ICD-10-CM | POA: Diagnosis not present

## 2021-10-05 DIAGNOSIS — D229 Melanocytic nevi, unspecified: Secondary | ICD-10-CM

## 2021-10-05 DIAGNOSIS — D1800 Hemangioma unspecified site: Secondary | ICD-10-CM

## 2021-10-05 NOTE — Progress Notes (Signed)
   Follow-Up Visit   Subjective  [name] is a 70 y.o. female who presents for the following: Total body skin exam (Hx of BCC L thigh).  She has a spot on her areola in the area she had breast surgery. The patient presents for Total-Body Skin Exam (TBSE) for skin cancer screening and mole check.  The following portions of the chart were reviewed this encounter and updated as appropriate:   Tobacco  Allergies  Meds  Problems  Med Hx  Surg Hx  Fam Hx     Review of Systems:  No other skin or systemic complaints except as noted in HPI or Assessment and Plan.  Objective  Well appearing patient in no apparent distress; mood and affect are within normal limits.  A full examination was performed including scalp, head, eyes, ears, nose, lips, neck, chest, axillae, abdomen, back, buttocks, bilateral upper extremities, bilateral lower extremities, hands, feet, fingers, toes, fingernails, and toenails. All findings within normal limits unless otherwise noted below.  L ant thigh Well healed scar with no evidence of recurrence.   L areola keloid   Assessment & Plan   Lentigines - Scattered tan macules - Due to sun exposure - Benign-appearing, observe - Recommend daily broad spectrum sunscreen SPF 30+ to sun-exposed areas, reapply every 2 hours as needed. - Call for any changes  Seborrheic Keratoses - Stuck-on, waxy, tan-brown papules and/or plaques  - Benign-appearing - Discussed benign etiology and prognosis. - Observe - Call for any changes  Melanocytic Nevi - Tan-brown and/or pink-flesh-colored symmetric macules and papules - Benign appearing on exam today - Observation - Call clinic for new or changing moles - Recommend daily use of broad spectrum spf 30+ sunscreen to sun-exposed areas.   Hemangiomas - Red papules - Discussed benign nature - Observe - Call for any changes  Actinic Damage - Chronic condition, secondary to cumulative UV/sun exposure - diffuse  scaly erythematous macules with underlying dyspigmentation - Recommend daily broad spectrum sunscreen SPF 30+ to sun-exposed areas, reapply every 2 hours as needed.  - Staying in the shade or wearing long sleeves, sun glasses (UVA+UVB protection) and wide brim hats (4-inch brim around the entire circumference of the hat) are also recommended for sun protection.  - Call for new or changing lesions.  Skin cancer screening performed today.  History of basal cell carcinoma (BCC) L ant thigh Clear. Observe for recurrence. Call clinic for new or changing lesions.  Recommend regular skin exams, daily broad-spectrum spf 30+ sunscreen use, and photoprotection.    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 Patient has consulted with her breast surgeon and he confirmed it was a benign keloid Discussed Kenalog IL injections she declines at this time.  Skin cancer screening  Return in about 1 year (around 10/05/2022) for TBSE, Hx of BCC.  I, Othelia Pulling, RMA, am acting as scribe for Sarina Ser, MD . Documentation: I have reviewed the above documentation for accuracy and completeness, and I agree with the above.  Sarina Ser, MD

## 2021-10-05 NOTE — Patient Instructions (Signed)

## 2021-10-06 ENCOUNTER — Encounter: Payer: Self-pay | Admitting: Dermatology

## 2021-10-10 ENCOUNTER — Other Ambulatory Visit: Payer: Self-pay | Admitting: Family Medicine

## 2021-10-11 ENCOUNTER — Encounter: Payer: Self-pay | Admitting: Dermatology

## 2021-10-11 ENCOUNTER — Other Ambulatory Visit: Payer: Self-pay

## 2021-10-11 ENCOUNTER — Ambulatory Visit (INDEPENDENT_AMBULATORY_CARE_PROVIDER_SITE_OTHER): Payer: Self-pay | Admitting: Dermatology

## 2021-10-11 DIAGNOSIS — L988 Other specified disorders of the skin and subcutaneous tissue: Secondary | ICD-10-CM

## 2021-10-11 DIAGNOSIS — L814 Other melanin hyperpigmentation: Secondary | ICD-10-CM

## 2021-10-11 LAB — CMETAB+FERRITIN
AST: 22 IU/L (ref 0–40)
Albumin/Globulin Ratio: 2.4 — ABNORMAL HIGH (ref 1.2–2.2)
Albumin: 4.1 g/dL (ref 3.8–4.8)
Alkaline Phosphatase: 77 IU/L (ref 44–121)
BUN/Creatinine Ratio: 21 (ref 12–28)
BUN: 15 mg/dL (ref 8–27)
Bilirubin Total: 0.4 mg/dL (ref 0.0–1.2)
Calcium: 9.2 mg/dL (ref 8.7–10.3)
Chloride: 107 mmol/L — ABNORMAL HIGH (ref 96–106)
Creatinine, Ser: 0.73 mg/dL (ref 0.57–1.00)
Ferritin: 101 ng/mL (ref 15–150)
Globulin, Total: 1.7 g/dL (ref 1.5–4.5)
Glucose: 97 mg/dL (ref 70–99)
Potassium: 4 mmol/L (ref 3.5–5.2)
Sodium: 141 mmol/L (ref 134–144)
Total Protein: 5.8 g/dL — ABNORMAL LOW (ref 6.0–8.5)
eGFR: 90 mL/min/{1.73_m2} (ref >59)

## 2021-10-11 LAB — CBC WITH DIFFERENTIAL/PLATELET
Basophils Absolute: 0 10*3/uL (ref 0.0–0.2)
Basos: 1 %
EOS (ABSOLUTE): 0.1 10*3/uL (ref 0.0–0.4)
Eos: 2 %
Hematocrit: 42.1 % (ref 34.0–46.6)
Hemoglobin: 14 g/dL (ref 11.1–15.9)
Immature Grans (Abs): 0 10*3/uL (ref 0.0–0.1)
Immature Granulocytes: 0 %
Lymphocytes Absolute: 2.4 10*3/uL (ref 0.7–3.1)
Lymphs: 45 %
MCH: 30.2 pg (ref 26.6–33.0)
MCHC: 33.3 g/dL (ref 31.5–35.7)
MCV: 91 fL (ref 79–97)
Monocytes Absolute: 0.4 10*3/uL (ref 0.1–0.9)
Monocytes: 7 %
Neutrophils Absolute: 2.5 10*3/uL (ref 1.4–7.0)
Neutrophils: 45 %
Platelets: 183 10*3/uL (ref 150–450)
RBC: 4.64 x10E6/uL (ref 3.77–5.28)
RDW: 12.6 % (ref 11.7–15.4)
WBC: 5.4 10*3/uL (ref 3.4–10.8)

## 2021-10-11 LAB — LIPID PANEL W/O CHOL/HDL RATIO
Cholesterol, Total: 233 mg/dL — ABNORMAL HIGH (ref 100–199)
HDL: 70 mg/dL (ref >39)
LDL Chol Calc (NIH): 143 mg/dL — ABNORMAL HIGH (ref 0–99)
Triglycerides: 115 mg/dL (ref 0–149)
VLDL Cholesterol Cal: 20 mg/dL (ref 5–40)

## 2021-10-11 LAB — VITAMIN D 25 HYDROXY (VIT D DEFICIENCY, FRACTURES): Vit D, 25-Hydroxy: 48.2 ng/mL (ref 30.0–100.0)

## 2021-10-11 LAB — TSH: TSH: 2.14 u[IU]/mL (ref 0.450–4.500)

## 2021-10-11 NOTE — Patient Instructions (Signed)

## 2021-10-11 NOTE — Progress Notes (Signed)
   Follow-Up Visit   Subjective  [name] is a 70 y.o. female who presents for the following: Facial Elastosis (Patient is here today for Botox injections.).  The following portions of the chart were reviewed this encounter and updated as appropriate:   Tobacco  Allergies  Meds  Problems  Med Hx  Surg Hx  Fam Hx     Review of Systems:  No other skin or systemic complaints except as noted in HPI or Assessment and Plan.  Objective  Well appearing patient in no apparent distress; mood and affect are within normal limits.  A focused examination was performed including the face. Relevant physical exam findings are noted in the Assessment and Plan.  Face Rhytides and volume loss.       Assessment & Plan  Elastosis of skin Face  Botox 20 units injected into the frown complex.   Botox Injection - Face Location: See attached image  Informed consent: Discussed risks (infection, pain, bleeding, bruising, swelling, allergic reaction, paralysis of nearby muscles, eyelid droop, double vision, neck weakness, difficulty breathing, headache, undesirable cosmetic result, and need for additional treatment) and benefits of the procedure, as well as the alternatives.  Informed consent was obtained.  Preparation: The area was cleansed with alcohol.  Procedure Details:  Botox was injected into the dermis with a 30-gauge needle. Pressure applied to any bleeding. Ice packs offered for swelling.  Lot Number:  [id number] Expiration:  06/2023  Total Units Injected:  20 units  Plan: Patient was instructed to remain upright for 4 hours. Patient was instructed to avoid massaging the face and avoid vigorous exercise for the rest of the day. Tylenol may be used for headache.  Allow 2 weeks before returning to clinic for additional dosing as needed. Patient will call for any problems.  Lentigines - Discussed BBL / laser for face - consider this winter - Scattered tan macules - Due to sun  exposure - Benign-appering, observe - Recommend daily broad spectrum sunscreen SPF 30+ to sun-exposed areas, reapply every 2 hours as needed. - Call for any changes  Return in about 3 months (around 01/11/2022) for Botox injections.  Luther Redo, CMA, am acting as scribe for Sarina Ser, MD .  Documentation: I have reviewed the above documentation for accuracy and completeness, and I agree with the above.  Sarina Ser, MD

## 2021-10-14 ENCOUNTER — Other Ambulatory Visit: Payer: Self-pay | Admitting: Internal Medicine

## 2021-10-25 ENCOUNTER — Ambulatory Visit: Payer: Managed Care, Other (non HMO) | Admitting: Dermatology

## 2021-11-07 ENCOUNTER — Ambulatory Visit (INDEPENDENT_AMBULATORY_CARE_PROVIDER_SITE_OTHER): Payer: Managed Care, Other (non HMO) | Admitting: Family Medicine

## 2021-11-07 ENCOUNTER — Encounter: Payer: Self-pay | Admitting: Family Medicine

## 2021-11-07 ENCOUNTER — Other Ambulatory Visit: Payer: Self-pay

## 2021-11-07 VITALS — BP 120/78 | HR 55 | Temp 97.9°F | Ht 65.0 in | Wt 147.0 lb

## 2021-11-07 DIAGNOSIS — Z8249 Family history of ischemic heart disease and other diseases of the circulatory system: Secondary | ICD-10-CM

## 2021-11-07 DIAGNOSIS — N3281 Overactive bladder: Secondary | ICD-10-CM

## 2021-11-07 DIAGNOSIS — E039 Hypothyroidism, unspecified: Secondary | ICD-10-CM

## 2021-11-07 DIAGNOSIS — E785 Hyperlipidemia, unspecified: Secondary | ICD-10-CM

## 2021-11-07 DIAGNOSIS — Z1231 Encounter for screening mammogram for malignant neoplasm of breast: Secondary | ICD-10-CM

## 2021-11-07 DIAGNOSIS — Z0000 Encounter for general adult medical examination without abnormal findings: Secondary | ICD-10-CM | POA: Diagnosis not present

## 2021-11-07 DIAGNOSIS — Z7189 Other specified counseling: Secondary | ICD-10-CM

## 2021-11-07 DIAGNOSIS — Z780 Asymptomatic menopausal state: Secondary | ICD-10-CM

## 2021-11-07 MED ORDER — DOXYCYCLINE HYCLATE 50 MG PO CAPS: 50.0000 mg | ORAL_CAPSULE | Freq: Two times a day (BID) | ORAL | Status: DC

## 2021-11-07 MED ORDER — ACYCLOVIR 200 MG PO CAPS: 200.0000 mg | ORAL_CAPSULE | Freq: Three times a day (TID) | ORAL | Status: DC | PRN

## 2021-11-07 NOTE — Progress Notes (Signed)
This visit occurred during the SARS-CoV-2 public health emergency.  Safety protocols were in place, including screening questions prior to the visit, additional usage of staff PPE, and extensive cleaning of exam room while observing appropriate contact time as indicated for disinfecting solutions.  CPE- See plan.  Routine anticipatory guidance given to patient.  See health maintenance.  The possibility exists that previously documented standard health maintenance information may have been brought forward from a previous encounter into this note.  If needed, that same information has been updated to reflect the current situation based on today's encounter.    Flu shot 2022 Covid vaccine prev done.   PNA prev done Tetanus 2017 Shingles prev done.   Pap not indicated.  Mammogram done 2022 DXA d/w pt.   Husband designated if patient were incapacitated.   Colonoscopy 2017 Intentional weight loss noted.  Labs d/w pt.  She is still walking for exercise.    She takes propranolol during stressful events.  It helps.    Taking levothyroxine 82mcg daily.  No dysphagia.  No neck mass.  Labs d/w pt.  TSH wnl.   Taking 5mg  vesicare helps with urgency.    She can exert w/o CP but occ has episodes of dyspnea.  Lasts a few minutes then self resolves.  Going on about 3 times a year, stable for the last few years.    The 10-year ASCVD risk score (Arnett DK, et al., 2019) is: 6.7%   Values used to calculate the score:     Age: 70 years     Sex: Female     Is Non-Hispanic African American: No     Diabetic: No     Tobacco smoker: No     Systolic Blood Pressure: 585 mmHg     Is BP treated: No     HDL Cholesterol: 70 mg/dL     Total Cholesterol: 233 mg/dL  We talked about coronary CT screening given her FH CAD (father).  PMH and SH reviewed  Meds, vitals, and allergies reviewed.   ROS: Per HPI.  Unless specifically indicated otherwise in HPI, the patient denies:  General: fever. Eyes: acute  vision changes ENT: sore throat Cardiovascular: chest pain Respiratory: SOB GI: vomiting GU: dysuria Musculoskeletal: acute back pain Derm: acute rash Neuro: acute motor dysfunction Psych: worsening mood Endocrine: polydipsia Heme: bleeding Allergy: hayfever  GEN: nad, alert and oriented HEENT: NCAT NECK: supple w/o LA CV: rrr. PULM: ctab, no inc wob ABD: soft, +bs EXT: no edema SKIN: no acute rash

## 2021-11-07 NOTE — Patient Instructions (Addendum)
Please call about your mammogram at [facility].  [place]  Let me see about heart CT options and we'll be in touch.  Take care.  Glad to see you.

## 2021-11-09 ENCOUNTER — Encounter: Payer: Self-pay | Admitting: *Deleted

## 2021-11-09 ENCOUNTER — Telehealth: Payer: Self-pay | Admitting: Family Medicine

## 2021-11-09 DIAGNOSIS — Z0000 Encounter for general adult medical examination without abnormal findings: Secondary | ICD-10-CM | POA: Insufficient documentation

## 2021-11-09 DIAGNOSIS — E785 Hyperlipidemia, unspecified: Secondary | ICD-10-CM | POA: Insufficient documentation

## 2021-11-09 DIAGNOSIS — Z7189 Other specified counseling: Secondary | ICD-10-CM | POA: Insufficient documentation

## 2021-11-09 NOTE — Telephone Encounter (Signed)
Please update patient.  She can get calcium scoring done w/o contrast.  That is the best option.  I checked with radiology about this.  I put in the order and she should get a call about scheduling.  Also please change PCP to me in the EMR.  Thanks.

## 2021-11-09 NOTE — Assessment & Plan Note (Signed)
Continue work on diet and exercise.  We talked about options for coronary calcium evaluation.  See follow-up phone note.  Calcium scoring CT ordered.  We can get that and then address her lipids if needed.

## 2021-11-09 NOTE — Assessment & Plan Note (Signed)
Flu shot 2022 Covid vaccine prev done.   PNA prev done Tetanus 2017 Shingles prev done.   Pap not indicated.  Mammogram done 2022 DXA d/w pt.   Husband designated if patient were incapacitated.   Colonoscopy 2017 Intentional weight loss noted.  Labs d/w pt.  She is still walking for exercise.

## 2021-11-09 NOTE — Assessment & Plan Note (Signed)
Husband designated if patient were incapacitated. 

## 2021-11-09 NOTE — Assessment & Plan Note (Signed)
Continue 5 mg Vesicare.

## 2021-11-09 NOTE — Assessment & Plan Note (Signed)
Continue levothyroxine 

## 2021-11-10 NOTE — Telephone Encounter (Signed)
Patient is scheduled for scan on 12/08/21.

## 2021-12-08 ENCOUNTER — Ambulatory Visit
Admission: RE | Admit: 2021-12-08 | Discharge: 2021-12-08 | Disposition: A | Discharge status: Home / Self Care | Payer: Managed Care, Other (non HMO) | Source: Ambulatory Visit | Attending: Family Medicine | Admitting: Family Medicine

## 2021-12-08 ENCOUNTER — Other Ambulatory Visit: Payer: Self-pay

## 2021-12-08 DIAGNOSIS — E785 Hyperlipidemia, unspecified: Secondary | ICD-10-CM | POA: Insufficient documentation

## 2021-12-08 DIAGNOSIS — Z8249 Family history of ischemic heart disease and other diseases of the circulatory system: Secondary | ICD-10-CM | POA: Insufficient documentation

## 2021-12-14 ENCOUNTER — Encounter: Payer: Self-pay | Admitting: Family Medicine

## 2021-12-16 NOTE — Telephone Encounter (Signed)
Patient has been notified of results.  

## 2021-12-16 NOTE — Telephone Encounter (Signed)
See result note.  Please check to see when the cardiac overread is going to be available and please call the patient about the prev result note.  Thanks.

## 2021-12-18 ENCOUNTER — Encounter: Payer: Self-pay | Admitting: Family Medicine

## 2021-12-19 ENCOUNTER — Other Ambulatory Visit: Payer: Self-pay | Admitting: Family Medicine

## 2021-12-19 DIAGNOSIS — E785 Hyperlipidemia, unspecified: Secondary | ICD-10-CM

## 2021-12-19 MED ORDER — ATORVASTATIN CALCIUM 10 MG PO TABS: 10.0000 mg | ORAL_TABLET | Freq: Every day | ORAL | 3 refills | Status: DC

## 2021-12-23 ENCOUNTER — Other Ambulatory Visit: Payer: Self-pay | Admitting: Psychiatry

## 2021-12-23 DIAGNOSIS — F411 Generalized anxiety disorder: Secondary | ICD-10-CM

## 2022-01-06 NOTE — Progress Notes (Signed)
[facility] Phone: [phone] Subjective:   [name], am serving as a scribe for Dr. Hulan Saas. This visit occurred during the SARS-CoV-2 public health emergency.  Safety protocols were in place, including screening questions prior to the visit, additional usage of staff PPE, and extensive cleaning of exam room while observing appropriate contact time as indicated for disinfecting solutions.  I'm seeing this patient by the request  of:  Tonia Ghent, MD  CC: Left shoulder pain  HEN:[id number]  [name] is a 71 y.o. female coming in with complaint of L shoulder pain since tripping and falling into a door frame. Injury on 12/03/2021. Patient states that her pain has improved but ROM has not. Tried rest, Aleve, Motrin, massage. Pain with flexion. Denies any radiating symptoms. Does have some neck pain due to cervical fusion.      Past Medical History:  Diagnosis Date   Anxiety    Basal cell carcinoma    L thigh, txted ~1990 in [place]   Rosacea    ocular, on chronic doxycycline treatment   Thyroid disease    Past Surgical History:  Procedure Laterality Date   ABDOMINAL HYSTERECTOMY  1992   Total   BREAST BIOPSY Right 07/20/2015   neg fat necrosis   BREAST EXCISIONAL BIOPSY Right 1960's   negative. [name] scar seen   CERVICAL FUSION  2005,2010   C2-C7   REDUCTION MAMMAPLASTY Bilateral 2016   TONSILLECTOMY  1961   Social History   Socioeconomic History   Marital status: Married    Spouse name: jeff   Number of children: 2   Years of education: Not on file   Highest education level: Master's degree (e.g., MA, MS, MEng, MEd, MSW, MBA)  Occupational History    Comment: retired  Tobacco Use   Smoking status: Never   Smokeless tobacco: Never  Vaping Use   Vaping Use: Never used  Substance and Sexual Activity   Alcohol use: Yes    Alcohol/week: 1.0 standard drink    Types: 1 Glasses of  wine per week    Comment: occasional to rare use   Drug use: [name]   Sexual activity: Yes    Birth control/protection: None  Other Topics Concern   Not on file  Social History Narrative   Married 2004   3 kids total (she has two, huband has one)   4 grandkids.     Retired.     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   Social Determinants of Health   Financial Resource Strain: Not on file  Food Insecurity: Not on file  Transportation Needs: Not on file  Physical Activity: Not on file  Stress: Not on file  Social Connections: Not on file   Allergies  Allergen Reactions   Codeine Hives   Iodinated Contrast Media Itching   Metrizamide Itching   Family History  Problem Relation Age of Onset   Cancer Mother        Uterine, Ovarian   Anxiety disorder Mother    Depression Mother    Heart disease Father    Stroke Father    Diabetes Father    ADD / ADHD Daughter    Cancer Maternal Grandmother    ADD / ADHD Grandchild    Prostate cancer Neg Hx    Kidney cancer Neg Hx    Breast cancer Neg Hx    Colon  cancer Neg Hx     Current Outpatient Medications (Endocrine & Metabolic):    levothyroxine (SYNTHROID) 75 MCG tablet, TAKE 1 TABLET BY MOUTH  DAILY BEFORE BREAKFAST  Current Outpatient Medications (Cardiovascular):    atorvastatin (LIPITOR) 10 MG tablet, Take 1 tablet (10 mg total) by mouth daily.   propranolol (INDERAL) 10 MG tablet, TAKE 1 TABLET BY MOUTH 3  TIMES DAILY AS NEEDED   Current Outpatient Medications (Analgesics):    ibuprofen (ADVIL,MOTRIN) 200 MG tablet, Take 200 mg by mouth every 6 (six) hours as needed for moderate pain.   Current Outpatient Medications (Other):    acyclovir (ZOVIRAX) 200 MG capsule, Take 1 capsule (200 mg total) by mouth 3 (three) times daily as needed.   Biotin 5 MG CAPS, Take by mouth.   Cholecalciferol (VITAMIN D3) 50 MCG (2000 UT) capsule, Take by mouth.   doxycycline (VIBRAMYCIN) 50 MG capsule, Take  1 capsule (50 mg total) by mouth 2 (two) times daily.   mirtazapine (REMERON) 7.5 MG tablet, TAKE 1 TABLET BY MOUTH AT  BEDTIME   Multiple Vitamins-Minerals (OCUVITE ADULT 50+ PO), Take by mouth.   Omega-3 Fatty Acids (FISH OIL) 1000 MG CAPS, Take by mouth.   solifenacin (VESICARE) 10 MG tablet, Take 0.5 tablets (5 mg total) by mouth daily.   White Petrolatum-Mineral Oil (GENTEAL TEARS NIGHT-TIME) OINT, Apply to eye.   melatonin 5 MG TABS, Take 5 mg by mouth.   Reviewed prior external information including notes and imaging from  primary care provider As well as notes that were available from care everywhere and other healthcare systems.  Past medical history, social, surgical and family history all reviewed in electronic medical record.  [name] pertanent information unless stated regarding to the chief complaint.   Review of Systems:  [name] headache, visual changes, nausea, vomiting, diarrhea, constipation, dizziness, abdominal pain, skin rash, fevers, chills, night sweats, weight loss, swollen lymph nodes, body aches, joint swelling, chest pain, shortness of breath, mood changes. POSITIVE muscle aches  Objective  Blood pressure 110/70, pulse 67, height 5\' 5"  (1.651 m), weight 150 lb (68 kg), SpO2 98 %.   General: [name] apparent distress alert and oriented x3 mood and affect normal, dressed appropriately.  Patient does have mild masked facies noted.  Does seem to have a benign tremor of the left upper extremity. HEENT: Pupils equal, extraocular movements intact  Respiratory: Patient's speak in full sentences and does not appear short of breath  Cardiovascular: [name] lower extremity edema, non tender, [name] erythema  Gait normal with good balance and coordination.  MSK: Left shoulder does not move relatively well with [name] significant hypertrophy.  Patient does have pain at anything greater than 85 degrees of flexion.  Rotator cuff strength though seems to be intact.  Severe worsening pain though with any  type of impingement noted.  Limited muscular skeletal ultrasound was performed and interpreted by Hulan Saas, M  Limited ultrasound of patient's left shoulder shows that there is a cortical irregularity noted of the humeral head on the anterior lateral aspect.  Patient does have possible hypoechoic changes noted of the subscapularis.  Hypoechoic changes noted in the bicep tendon as well but [name] true acute tearing appreciated. Impression: Humeral head injury   Impression and Recommendations:    The above documentation has been reviewed and is accurate and complete Elaine Pulley, DO

## 2022-01-09 ENCOUNTER — Other Ambulatory Visit: Payer: Self-pay

## 2022-01-09 ENCOUNTER — Ambulatory Visit (INDEPENDENT_AMBULATORY_CARE_PROVIDER_SITE_OTHER): Payer: Managed Care, Other (non HMO)

## 2022-01-09 ENCOUNTER — Encounter: Payer: Self-pay | Admitting: Family Medicine

## 2022-01-09 ENCOUNTER — Ambulatory Visit (INDEPENDENT_AMBULATORY_CARE_PROVIDER_SITE_OTHER): Payer: Managed Care, Other (non HMO) | Admitting: Family Medicine

## 2022-01-09 ENCOUNTER — Ambulatory Visit: Payer: Self-pay

## 2022-01-09 VITALS — BP 110/70 | HR 67 | Ht 65.0 in | Wt 150.0 lb

## 2022-01-09 DIAGNOSIS — M25512 Pain in left shoulder: Secondary | ICD-10-CM | POA: Insufficient documentation

## 2022-01-09 DIAGNOSIS — M25519 Pain in unspecified shoulder: Secondary | ICD-10-CM | POA: Insufficient documentation

## 2022-01-09 NOTE — Assessment & Plan Note (Signed)
Patient on exam does have some hypoechoic changes of the bicep tendon but also does have a cortical irregularity noted of the proximal humeral head that is concerning.  We will get x-rays to further evaluate.  It can be something that will be able to be tolerated with conservative therapy.  Patient given for home exercises and will advance accordingly.  Discussed vitamin D supplementation that I think will be helpful.  Follow-up with me again in 2 to 3 weeks.

## 2022-01-09 NOTE — Patient Instructions (Signed)
Xray Vit D 4000-5000 K2 27mcg for one month See me in 3-4 weeks

## 2022-01-11 ENCOUNTER — Other Ambulatory Visit: Payer: Self-pay

## 2022-01-11 ENCOUNTER — Ambulatory Visit
Admission: RE | Admit: 2022-01-11 | Discharge: 2022-01-11 | Disposition: A | Discharge status: Home / Self Care | Payer: Managed Care, Other (non HMO) | Source: Ambulatory Visit | Attending: Family Medicine | Admitting: Family Medicine

## 2022-01-11 DIAGNOSIS — Z780 Asymptomatic menopausal state: Secondary | ICD-10-CM | POA: Insufficient documentation

## 2022-01-11 DIAGNOSIS — Z1231 Encounter for screening mammogram for malignant neoplasm of breast: Secondary | ICD-10-CM | POA: Insufficient documentation

## 2022-01-31 ENCOUNTER — Ambulatory Visit: Payer: Managed Care, Other (non HMO) | Admitting: Dermatology

## 2022-02-02 ENCOUNTER — Encounter: Payer: Self-pay | Admitting: Psychiatry

## 2022-02-02 ENCOUNTER — Other Ambulatory Visit: Payer: Self-pay

## 2022-02-02 ENCOUNTER — Telehealth (INDEPENDENT_AMBULATORY_CARE_PROVIDER_SITE_OTHER): Payer: 59 | Admitting: Psychiatry

## 2022-02-02 DIAGNOSIS — F4312 Post-traumatic stress disorder, chronic: Secondary | ICD-10-CM

## 2022-02-02 DIAGNOSIS — F411 Generalized anxiety disorder: Secondary | ICD-10-CM

## 2022-02-02 DIAGNOSIS — R251 Tremor, unspecified: Secondary | ICD-10-CM | POA: Diagnosis not present

## 2022-02-02 NOTE — Progress Notes (Signed)
Virtual Visit via Video Note  I connected with Elaine [name] on 02/02/22 at  2:00 PM EST by a video enabled telemedicine application and verified that I am speaking with Elaine correct person using two identifiers.  Location Provider Location : [facility] [name] Location : Home  Participants: [name] , Provider   I discussed Elaine limitations of evaluation and management by telemedicine and Elaine availability of in person appointments. Elaine [name] expressed understanding and agreed to proceed.    I discussed Elaine assessment and treatment plan with Elaine [name]. Elaine [name] was provided an opportunity to ask questions and all were answered. Elaine [name] agreed with Elaine plan and demonstrated an understanding of Elaine instructions.   Elaine [name] was advised to call back or seek an in-person evaluation if Elaine symptoms worsen or if Elaine condition fails to improve as anticipated.    [place] MD  OP Progress Note  02/02/2022 6:18 PM Elaine [name]  MRN:  [id number]  Chief Complaint:  Chief Complaint  [name] presents with   Follow-up: 71 year old Caucasian female with history of PTSD, GAD was evaluated for medication management.   HPI: Elaine [name] is a 71 year old Caucasian female, retired, married, lives alternately in [place], currently at [place], [place], has a history of PTSD, GAD was evaluated by telemedicine today.  [name] today reports that has been some changes, situational stressors in her life recently.  She reports her husband who was supposed to retire in couple of years was forced to retire since his position at his company was eliminated.  [name] reports this is a big adjustment for both of them .  She did have an appointment with [name] yesterday which went well.  She continues to be motivated to stay in therapy.  Reports she did try to go off of Elaine mirtazapine for a few months.  However started having sleep problems and hence had to go back on it.  Since being on Elaine  mirtazapine has been sleeping fairly well.  Denies any suicidality or homicidality or perceptual disturbances.  Does report she has been having tremors of her upper extremities, possible postural tremors, getting worse since Elaine past few months, likely exacerbated by stressful situations, tiredness and so on.  Reports essential tremor runs in her family.  However this was brought to her attention recently by her sports medicine provider and hence is interested in neurology referral to rule out Parkinson's disease.   [name] denies any other concerns today.   Visit Diagnosis:    ICD-10-CM   1. GAD (generalized anxiety disorder)  F41.1     2. Chronic post-traumatic stress disorder (PTSD)  F43.12     3. Tremor  R25.1       Past Psychiatric History: Reviewed past psychiatric history from progress note on 03/11/2018.  Past Medical History:  Past Medical History:  Diagnosis Date   Anxiety    Basal cell carcinoma    L thigh, txted ~1990 in [place]   Rosacea    ocular, on chronic doxycycline treatment   Thyroid disease     Past Surgical History:  Procedure Laterality Date   ABDOMINAL HYSTERECTOMY  1992   Total   BREAST BIOPSY Right 07/20/2015   neg fat necrosis   BREAST EXCISIONAL BIOPSY Right 1960's   negative. No scar seen   CERVICAL FUSION  2005,2010   C2-C7   REDUCTION MAMMAPLASTY Bilateral 2016   TONSILLECTOMY  1961    Family Psychiatric History: Reviewed family psychiatric history from progress note on  03/11/2018.  Family History:  Family History  Problem Relation Age of Onset   Cancer Mother        Uterine, Ovarian   Anxiety disorder Mother    Depression Mother    Heart disease Father    Stroke Father    Diabetes Father    ADD / ADHD Daughter    Cancer Maternal Grandmother    ADD / ADHD Grandchild    Prostate cancer Neg Hx    Kidney cancer Neg Hx    Breast cancer Neg Hx    Colon cancer Neg Hx     Social History: Reviewed social history from progress note  on 03/11/2018. Social History   Socioeconomic History   Marital status: Married    Spouse name: jeff   Number of children: 2   Years of education: Not on file   Highest education level: Master's degree (e.g., MA, MS, MEng, MEd, MSW, MBA)  Occupational History    Comment: retired  Tobacco Use   Smoking status: Never   Smokeless tobacco: Never  Vaping Use   Vaping Use: Never used  Substance and Sexual Activity   Alcohol use: Yes    Alcohol/week: 1.0 standard drink    Types: 1 Glasses of wine per week    Comment: occasional to rare use   Drug use: No   Sexual activity: Yes    Birth control/protection: None  Other Topics Concern   Not on file  Social History Narrative   Married 2004   3 kids total (she has two, huband has one)   4 grandkids.     Retired.     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   Social Determinants of Health   Financial Resource Strain: Not on file  Food Insecurity: Not on file  Transportation Needs: Not on file  Physical Activity: Not on file  Stress: Not on file  Social Connections: Not on file    Allergies:  Allergies  Allergen Reactions   Codeine Hives   Iodinated Contrast Media Itching   Metrizamide Itching    Metabolic Disorder Labs: No results found for: HGBA1C, MPG No results found for: PROLACTIN Lab Results  Component Value Date   CHOL 233 (H) 10/10/2021   TRIG 115 10/10/2021   HDL 70 10/10/2021   CHOLHDL 3.8 02/10/2021   LDLCALC 143 (H) 10/10/2021   LDLCALC 162 (H) 02/10/2021   Lab Results  Component Value Date   TSH 2.140 10/10/2021   TSH 1.860 11/02/2020    Therapeutic Level Labs: No results found for: LITHIUM No results found for: VALPROATE No components found for:  CBMZ  Current Medications: Current Outpatient Medications  Medication Sig Dispense Refill   acyclovir (ZOVIRAX) 200 MG capsule Take 1 capsule (200 mg total) by mouth 3 (three) times daily as needed.     atorvastatin  (LIPITOR) 10 MG tablet Take 1 tablet (10 mg total) by mouth daily. 90 tablet 3   Biotin 5 MG CAPS Take by mouth.     Cholecalciferol (VITAMIN D3) 50 MCG (2000 UT) capsule Take by mouth.     doxycycline (VIBRAMYCIN) 50 MG capsule Take 1 capsule (50 mg total) by mouth 2 (two) times daily.     ibuprofen (ADVIL,MOTRIN) 200 MG tablet Take 200 mg by mouth every 6 (six) hours as needed for moderate pain.     levothyroxine (SYNTHROID) 75 MCG tablet TAKE 1 TABLET BY MOUTH  DAILY BEFORE BREAKFAST 30 tablet 11  melatonin 5 MG TABS Take 5 mg by mouth.     mirtazapine (REMERON) 7.5 MG tablet TAKE 1 TABLET BY MOUTH AT  BEDTIME 90 tablet 3   Multiple Vitamins-Minerals (OCUVITE ADULT 50+ PO) Take by mouth.     Omega-3 Fatty Acids (FISH OIL) 1000 MG CAPS Take by mouth.     propranolol (INDERAL) 10 MG tablet TAKE 1 TABLET BY MOUTH 3  TIMES DAILY AS NEEDED 270 tablet 3   solifenacin (VESICARE) 10 MG tablet Take 0.5 tablets (5 mg total) by mouth daily. 90 tablet 3   White Petrolatum-Mineral Oil (GENTEAL TEARS NIGHT-TIME) OINT Apply to eye.     No current facility-administered medications for this visit.     Musculoskeletal: Strength & Muscle Tone:  UTA Gait & Station:  Seated [name] leans: N/A  Psychiatric Specialty Exam: Review of Systems  Neurological:  Positive for tremors.  Psychiatric/Behavioral:  Elaine [name] is nervous/anxious.   All other systems reviewed and are negative.  There were no vitals taken for this visit.There is no height or weight on file to calculate BMI.  General Appearance: Casual  Eye Contact:  Fair  Speech:  Clear and Coherent  Volume:  Normal  Mood:  Anxious  Affect:  Congruent  Thought Process:  Goal Directed and Descriptions of Associations: Intact  Orientation:  Full (Time, Place, and Person)  Thought Content: Logical   Suicidal Thoughts:  No  Homicidal Thoughts:  No  Memory:  Immediate;   Fair Recent;   Fair Remote;   Fair  Judgement:  Fair  Insight:  Fair   Psychomotor Activity:  Tremor  Concentration:  Concentration: Fair and Attention Span: Fair  Recall:  [employer] of Knowledge: Fair  Language: Fair  Akathisia:  No  Handed:  Right  AIMS (if indicated): done,0  Assets:  Communication Skills Desire for Improvement Housing Social Support  ADL's:  Intact  Cognition: WNL  Sleep:  Fair   Screenings: GAD-7    Flowsheet Row Video Visit from 02/02/2022 in [place] Video Visit from 07/21/2021 in [facility] Video Visit from 01/12/2021 in [facility] Visit from 11/02/2020 in [place] [facility] [place] Visit from 08/14/2019 in [place]  Total GAD-7 Score 6 3 3  0 5      PHQ2-9    Flowsheet Row Video Visit from 02/02/2022 in [place] Video Visit from 07/21/2021 in [facility] Visit from 11/02/2020 in [place] at [facility] Visit from 10/30/2019 in [facility] [place] at [facility] Visit from 10/09/2016 in [place] at [facility]  PHQ-2 Total Score 0 0 0 0 0  PHQ-9 Total Score -- -- 0 -- --      Flowsheet Row Video Visit from 07/21/2021 in [facility] No Risk        Assessment and Plan: Tanyah Debruyne is a 71 year old Caucasian female who is married, retired, has a history of PTSD, anxiety disorder was evaluated by telemedicine today.  [name] is currently going through psychosocial stressors as noted above however overall she has been doing well on Elaine current medication regimen.  [name] with recent exacerbation of tremors, will benefit from neurology referral .  Discussed plan as noted below.  Plan GAD-stable Mirtazapine 7.5 mg p.o. nightly for sleep Propranolol 10 mg p.o. 3 times daily as needed [name] to continue CBT with [name]  PTSD-stable Mirtazapine 7.5 mg p.o. nightly for sleep Continue CBT  Tremor-unstable Will refer to neurology.  Follow-up in clinic in 6 months or sooner if needed.   Collaboration of Care: Collaboration of Care: Other referred for neurology consultation for evaluation of her tremors.  I have communicated with staff.  [name]/Guardian was advised Release of Information must be obtained prior to any record release in order to collaborate their care with an outside provider. [name]/Guardian was advised if they have not already done so to contact Elaine registration department to sign all necessary forms in order for [place] to release information regarding their care.   Consent: [name]/Guardian gives verbal consent for treatment and assignment of benefits for services provided during this visit. [name]/Guardian expressed understanding and agreed to proceed.   This note was generated in part or whole with voice recognition software. Voice recognition is usually quite accurate but there are transcription errors that can and very often do occur. I apologize for any typographical errors that were not detected and corrected.      Ursula Alert, MD 02/02/2022, 6:18 PM

## 2022-02-07 ENCOUNTER — Ambulatory Visit (INDEPENDENT_AMBULATORY_CARE_PROVIDER_SITE_OTHER): Payer: Self-pay | Admitting: Dermatology

## 2022-02-07 ENCOUNTER — Other Ambulatory Visit: Payer: Self-pay

## 2022-02-07 DIAGNOSIS — L988 Other specified disorders of the skin and subcutaneous tissue: Secondary | ICD-10-CM

## 2022-02-07 NOTE — Progress Notes (Signed)
° °  Follow-Up Visit   Subjective  [name] is a 71 y.o. female who presents for the following: Facial Elastosis (Patient is here today for Botox injections in the frown complex. ).  The following portions of the chart were reviewed this encounter and updated as appropriate:   Tobacco   Allergies   Meds   Problems   Med Hx   Surg Hx   Fam Hx      Review of Systems:  No other skin or systemic complaints except as noted in HPI or Assessment and Plan.  Objective  Well appearing patient in no apparent distress; mood and affect are within normal limits.  A focused examination was performed including the face. Relevant physical exam findings are noted in the Assessment and Plan.  Face Rhytides and volume loss.       Assessment & Plan  Elastosis of skin Face  Botox Injection - Face Location: See attached image  Informed consent: Discussed risks (infection, pain, bleeding, bruising, swelling, allergic reaction, paralysis of nearby muscles, eyelid droop, double vision, neck weakness, difficulty breathing, headache, undesirable cosmetic result, and need for additional treatment) and benefits of the procedure, as well as the alternatives.  Informed consent was obtained.  Preparation: The area was cleansed with alcohol.  Procedure Details:  Botox was injected into the dermis with a 30-gauge needle. Pressure applied to any bleeding. Ice packs offered for swelling.  Lot Number:  [id number] Expiration:  02/2024  Total Units Injected:  20  Plan: Patient was instructed to remain upright for 4 hours. Patient was instructed to avoid massaging the face and avoid vigorous exercise for the rest of the day. Tylenol may be used for headache.  Allow 2 weeks before returning to clinic for additional dosing as needed. Patient will call for any problems.  Return in about 3 months (around 05/07/2022) for Botox injections.  Luther Redo, CMA, am acting as scribe for Sarina Ser, MD  . Documentation: I have reviewed the above documentation for accuracy and completeness, and I agree with the above.  Sarina Ser, MD

## 2022-02-07 NOTE — Patient Instructions (Signed)

## 2022-02-08 NOTE — Progress Notes (Signed)
?[name] D.O. ?[facility] ?[place] ?Phone: [phone] ?Subjective:   ?I, [name], am serving as a scribe for Dr. Hulan Saas. ? ?This visit occurred during the SARS-CoV-2 public health emergency.  Safety protocols were in place, including screening questions prior to the visit, additional usage of staff PPE, and extensive cleaning of exam room while observing appropriate contact time as indicated for disinfecting solutions.  ?I'm seeing this patient by the request  of:  Tonia Ghent, MD ? ?CC: Left shoulder pain ? ?QIO:[id number]  ?01/09/2022 ?Patient on exam does have some hypoechoic changes of the bicep tendon but also does have a cortical irregularity noted of the proximal humeral head that is concerning.  We will get x-rays to further evaluate.  It can be something that will be able to be tolerated with conservative therapy.  Patient given for home exercises and will advance accordingly.  Discussed vitamin D supplementation that I think will be helpful.  Follow-up with me again in 2 to 3 weeks. ? ?Update 02/09/2022 ?[name] is a 71 y.o. female coming in with complaint of L shoulder pain. Patient has had bone density since last visit. Patient states that she might be slightly better. Cannot sleep on L shoulder and notes a popping and burning randomly in upper arm. Taking Vit D, calcium and K2. Performing HEP and this does not cause pain.  ? ? Xray L shoulder 01/09/2022 ?IMPRESSION: ?Degenerative change without acute abnormality. ? ?Past Medical History:  ?Diagnosis Date  ? Anxiety   ? Basal cell carcinoma   ? L thigh, txted ~1990 in [place]  ? Rosacea   ? ocular, on chronic doxycycline treatment  ? Thyroid disease   ? ?Past Surgical History:  ?Procedure Laterality Date  ? ABDOMINAL HYSTERECTOMY  1992  ? Total  ? BREAST BIOPSY Right 07/20/2015  ? neg fat necrosis  ? BREAST EXCISIONAL BIOPSY Right 1960's  ? negative. No scar seen  ? CERVICAL FUSION   2005,2010  ? C2-C7  ? REDUCTION MAMMAPLASTY Bilateral 2016  ? TONSILLECTOMY  1961  ? ?Social History  ? ?Socioeconomic History  ? Marital status: Married  ?  Spouse name: jeff  ? Number of children: 2  ? Years of education: Not on file  ? Highest education level: Master's degree (e.g., MA, MS, MEng, MEd, MSW, MBA)  ?Occupational History  ?  Comment: retired  ?Tobacco Use  ? Smoking status: Never  ? Smokeless tobacco: Never  ?Vaping Use  ? Vaping Use: Never used  ?Substance and Sexual Activity  ? Alcohol use: Yes  ?  Alcohol/week: 1.0 standard drink  ?  Types: 1 Glasses of wine per week  ?  Comment: occasional to rare use  ? Drug use: No  ? Sexual activity: Yes  ?  Birth control/protection: None  ?Other Topics Concern  ? Not on file  ?Social History Narrative  ? Married 2004  ? 3 kids total (she has two, huband has one)  ? 4 grandkids.    ? Retired.    ? 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  ? ?Social Determinants of Health  ? ?Financial Resource Strain: Not on file  ?Food Insecurity: Not on file  ?Transportation Needs: Not on file  ?Physical Activity: Not on file  ?Stress: Not on file  ?Social Connections: Not on file  ? ?Allergies  ?Allergen Reactions  ? Codeine Hives  ? Iodinated Contrast Media Itching  ?  Metrizamide Itching  ? ?Family History  ?Problem Relation Age of Onset  ? Cancer Mother   ?     Uterine, Ovarian  ? Anxiety disorder Mother   ? Depression Mother   ? Heart disease Father   ? Stroke Father   ? Diabetes Father   ? ADD / ADHD Daughter   ? Cancer Maternal Grandmother   ? ADD / ADHD Grandchild   ? Prostate cancer Neg Hx   ? Kidney cancer Neg Hx   ? Breast cancer Neg Hx   ? Colon cancer Neg Hx   ? ? ?Current Outpatient Medications (Endocrine & Metabolic):  ?  levothyroxine (SYNTHROID) 75 MCG tablet, TAKE 1 TABLET BY MOUTH  DAILY BEFORE BREAKFAST ? ?Current Outpatient Medications (Cardiovascular):  ?  atorvastatin (LIPITOR) 10 MG tablet, Take 1 tablet (10 mg total) by  mouth daily. ?  propranolol (INDERAL) 10 MG tablet, TAKE 1 TABLET BY MOUTH 3  TIMES DAILY AS NEEDED ? ? ?Current Outpatient Medications (Analgesics):  ?  ibuprofen (ADVIL,MOTRIN) 200 MG tablet, Take 200 mg by mouth every 6 (six) hours as needed for moderate pain. ? ? ?Current Outpatient Medications (Other):  ?  acyclovir (ZOVIRAX) 200 MG capsule, Take 1 capsule (200 mg total) by mouth 3 (three) times daily as needed. ?  Biotin 5 MG CAPS, Take by mouth. ?  Cholecalciferol (VITAMIN D3) 50 MCG (2000 UT) capsule, Take by mouth. ?  doxycycline (VIBRAMYCIN) 50 MG capsule, Take 1 capsule (50 mg total) by mouth 2 (two) times daily. ?  mirtazapine (REMERON) 7.5 MG tablet, TAKE 1 TABLET BY MOUTH AT  BEDTIME ?  Multiple Vitamins-Minerals (OCUVITE ADULT 50+ PO), Take by mouth. ?  Omega-3 Fatty Acids (FISH OIL) 1000 MG CAPS, Take by mouth. ?  solifenacin (VESICARE) 10 MG tablet, Take 0.5 tablets (5 mg total) by mouth daily. ?  White Petrolatum-Mineral Oil (GENTEAL TEARS NIGHT-TIME) OINT, Apply to eye. ?  melatonin 5 MG TABS, Take 5 mg by mouth. ? ? ?Reviewed prior external information including notes and imaging from  ?primary care provider ?As well as notes that were available from care everywhere and other healthcare systems. ? ?Past medical history, social, surgical and family history all reviewed in electronic medical record.  No pertanent information unless stated regarding to the chief complaint.  ? ?Review of Systems: ? No headache, visual changes, nausea, vomiting, diarrhea, constipation, dizziness, abdominal pain, skin rash, fevers, chills, night sweats, weight loss, swollen lymph nodes, body aches, joint swelling, chest pain, shortness of breath, mood changes. POSITIVE muscle aches ? ?Objective  ?Blood pressure 114/68, pulse (!) 58, height 5\' 5"  (1.651 m), weight 148 lb (67.1 kg), SpO2 98 %. ?  ?General: No apparent distress alert and oriented x3 mood and affect normal, dressed appropriately. Continued tremors noted as  well  ?HEENT: Pupils equal, extraocular movements intact  ?Respiratory: Patient's speak in full sentences and does not appear short of breath  ?Cardiovascular: No lower extremity edema, non tender, no erythema  ?Gait normal with good balance and coordination.  ?MSK: Left shoulder exam does have some limited range of motion but actually mild improvement with active range of motion per patient.  Patient still has some weakness noted of the rotator cuff.  Positive O'Brien's ? ?  ?Impression and Recommendations:  ?  ? ?The above documentation has been reviewed and is accurate and complete Lyndal Pulley, DO ? ? ? ?

## 2022-02-09 ENCOUNTER — Other Ambulatory Visit: Payer: Self-pay

## 2022-02-09 ENCOUNTER — Ambulatory Visit (INDEPENDENT_AMBULATORY_CARE_PROVIDER_SITE_OTHER): Payer: Managed Care, Other (non HMO) | Admitting: Family Medicine

## 2022-02-09 ENCOUNTER — Ambulatory Visit: Payer: Self-pay

## 2022-02-09 ENCOUNTER — Encounter: Payer: Self-pay | Admitting: Dermatology

## 2022-02-09 VITALS — BP 114/68 | HR 58 | Ht 65.0 in | Wt 148.0 lb

## 2022-02-09 DIAGNOSIS — M25512 Pain in left shoulder: Secondary | ICD-10-CM

## 2022-02-09 DIAGNOSIS — G8929 Other chronic pain: Secondary | ICD-10-CM | POA: Diagnosis not present

## 2022-02-09 NOTE — Assessment & Plan Note (Signed)
Patient continues to have pain.  Seems to be more of the rotator cuff a little bit in the anterior labrum.  Patient did also have a cortical irregularity noted of the humeral head previously and patient does not think she is improving. ?

## 2022-02-09 NOTE — Patient Instructions (Addendum)
MRA L shoulder [phone] ?We will be in touch with results ? ?

## 2022-02-23 ENCOUNTER — Encounter: Payer: Self-pay | Admitting: Family Medicine

## 2022-02-26 NOTE — Telephone Encounter (Signed)
Please send the order for lipid panel and update patient.  Diagnosis E78.5.  Please send to [facility].  Let me know if I need to write a letter for the order.  Thanks. ?

## 2022-02-27 ENCOUNTER — Other Ambulatory Visit (INDEPENDENT_AMBULATORY_CARE_PROVIDER_SITE_OTHER): Payer: Managed Care, Other (non HMO)

## 2022-02-27 ENCOUNTER — Other Ambulatory Visit: Payer: Self-pay

## 2022-02-27 DIAGNOSIS — E785 Hyperlipidemia, unspecified: Secondary | ICD-10-CM | POA: Diagnosis not present

## 2022-02-28 ENCOUNTER — Other Ambulatory Visit: Payer: Managed Care, Other (non HMO)

## 2022-02-28 ENCOUNTER — Ambulatory Visit: Payer: Managed Care, Other (non HMO) | Admitting: Dermatology

## 2022-02-28 ENCOUNTER — Encounter: Payer: Self-pay | Admitting: Family Medicine

## 2022-02-28 LAB — LIPID PANEL
Chol/HDL Ratio: 2.7 ratio (ref 0.0–4.4)
Cholesterol, Total: 205 mg/dL — ABNORMAL HIGH (ref 100–199)
HDL: 76 mg/dL (ref >39)
LDL Chol Calc (NIH): 117 mg/dL — ABNORMAL HIGH (ref 0–99)
Triglycerides: 66 mg/dL (ref 0–149)
VLDL Cholesterol Cal: 12 mg/dL (ref 5–40)

## 2022-03-01 ENCOUNTER — Other Ambulatory Visit: Payer: Self-pay | Admitting: Family Medicine

## 2022-03-01 DIAGNOSIS — E785 Hyperlipidemia, unspecified: Secondary | ICD-10-CM

## 2022-03-01 MED ORDER — ATORVASTATIN CALCIUM 20 MG PO TABS: 20.0000 mg | ORAL_TABLET | Freq: Every day | ORAL | 3 refills | Status: DC

## 2022-03-03 ENCOUNTER — Other Ambulatory Visit: Payer: Self-pay | Admitting: Family

## 2022-03-07 NOTE — Telephone Encounter (Signed)
Levothyroxine sent.  Thanks.  ?

## 2022-03-11 ENCOUNTER — Encounter: Payer: Self-pay | Admitting: Family Medicine

## 2022-03-13 ENCOUNTER — Other Ambulatory Visit: Payer: Self-pay

## 2022-03-13 MED ORDER — SOLIFENACIN SUCCINATE 10 MG PO TABS: 5.0000 mg | ORAL_TABLET | Freq: Every day | ORAL | 3 refills | Status: DC

## 2022-03-13 NOTE — Telephone Encounter (Signed)
Medication refilled. Sent to [facility]  ?

## 2022-03-15 ENCOUNTER — Other Ambulatory Visit: Payer: Self-pay | Admitting: Family Medicine

## 2022-03-15 MED ORDER — DOXYCYCLINE HYCLATE 50 MG PO CAPS: 50.0000 mg | ORAL_CAPSULE | Freq: Two times a day (BID) | ORAL | 1 refills | Status: DC

## 2022-03-17 ENCOUNTER — Encounter: Payer: Self-pay | Admitting: Urology

## 2022-03-20 ENCOUNTER — Telehealth: Payer: Self-pay

## 2022-03-20 MED ORDER — TOLTERODINE TARTRATE ER 4 MG PO CP24: 4.0000 mg | ORAL_CAPSULE | Freq: Every day | ORAL | 3 refills | Status: DC

## 2022-03-20 NOTE — Telephone Encounter (Signed)
Patient would like to try Dextrol LA 4. Sent to pharmacy. Pt notified via mychart ?

## 2022-03-24 ENCOUNTER — Telehealth: Payer: Self-pay

## 2022-03-24 MED ORDER — PREDNISONE 50 MG PO TABS: ORAL_TABLET | ORAL | 0 refills | Status: DC

## 2022-03-24 NOTE — Telephone Encounter (Signed)
Phone call to patient to review instructions for 13 hr prep for Arthrogram w/ contrast on 03/27/22 at 3:30 pm. Prescription called into [facility]. Pt aware and verbalized understanding of instructions. ?Pt to take 50 mg of prednisone on 03/27/22 at 2:30 am, 50 mg of prednisone on 03/27/22 at 8:30 am, and 50 mg of prednisone on 03/27/22 at 2:30 pm. Pt is also to take 50 mg of benadryl on 03/27/22 at 2:30 pm. Please call [phone] with any questions. ? ?Benadryl not called in as a RX pt. Said that she would take her own OTC benadryl. Pt. Verbalized she understood when to take the benadryl prior to the scan.  ? ?

## 2022-03-27 ENCOUNTER — Ambulatory Visit
Admission: RE | Admit: 2022-03-27 | Discharge: 2022-03-27 | Disposition: A | Discharge status: Home / Self Care | Payer: Medicare Other | Source: Ambulatory Visit | Attending: Family Medicine | Admitting: Family Medicine

## 2022-03-27 DIAGNOSIS — G8929 Other chronic pain: Secondary | ICD-10-CM

## 2022-03-27 MED ORDER — IOPAMIDOL (ISOVUE-M 200) INJECTION 41%
16.0000 mL | Freq: Once | INTRAMUSCULAR | Status: AC
  Administered 2022-03-27: 16 mL via INTRA_ARTICULAR

## 2022-03-27 MED ORDER — OXYBUTYNIN CHLORIDE 5 MG PO TABS
5.0000 mg | ORAL_TABLET | Freq: Two times a day (BID) | ORAL | 3 refills | Status: AC
Start: 2022-03-27 — End: 2022-06-25

## 2022-03-27 NOTE — Telephone Encounter (Signed)
As per  Dr.MacDiarmid Oxybutynin immediate release 5 mg twice a day 60 tablets with 11 refills or 3 months at a time and 3 refills  ? ? ?Sent to pharmacy. Pt aware.  ?

## 2022-03-29 ENCOUNTER — Encounter: Payer: Self-pay | Admitting: Family Medicine

## 2022-04-27 ENCOUNTER — Other Ambulatory Visit: Payer: Self-pay | Admitting: Neurology

## 2022-04-27 ENCOUNTER — Other Ambulatory Visit (HOSPITAL_COMMUNITY): Payer: Self-pay | Admitting: Neurology

## 2022-04-27 DIAGNOSIS — G250 Essential tremor: Secondary | ICD-10-CM

## 2022-04-28 ENCOUNTER — Other Ambulatory Visit: Payer: Self-pay | Admitting: Family Medicine

## 2022-04-28 DIAGNOSIS — M25512 Pain in left shoulder: Secondary | ICD-10-CM

## 2022-05-05 ENCOUNTER — Ambulatory Visit
Admission: RE | Admit: 2022-05-05 | Discharge: 2022-05-05 | Disposition: A | Discharge status: Home / Self Care | Payer: Medicare Other | Source: Ambulatory Visit | Attending: Neurology | Admitting: Neurology

## 2022-05-05 DIAGNOSIS — G250 Essential tremor: Secondary | ICD-10-CM | POA: Insufficient documentation

## 2022-05-30 ENCOUNTER — Ambulatory Visit (INDEPENDENT_AMBULATORY_CARE_PROVIDER_SITE_OTHER): Payer: Self-pay | Admitting: Dermatology

## 2022-05-30 DIAGNOSIS — L988 Other specified disorders of the skin and subcutaneous tissue: Secondary | ICD-10-CM

## 2022-05-30 NOTE — Patient Instructions (Signed)
Due to recent changes in healthcare laws, you may see results of your pathology and/or laboratory studies on MyChart before the doctors have had a chance to review them. We understand that in some cases there may be results that are confusing or concerning to you. Please understand that not all results are received at the same time and often the doctors may need to interpret multiple results in order to provide you with the best plan of care or course of treatment. Therefore, we ask that you please give us 2 business days to thoroughly review all your results before contacting the office for clarification. Should we see a critical lab result, you will be contacted sooner.   If You Need Anything After Your Visit  If you have any questions or concerns for your doctor, please call our main line at [phone] and press option 4 to reach your doctor's medical assistant. If no one answers, please leave a voicemail as directed and we will return your call as soon as possible. Messages left after 4 pm will be answered the following business day.   You may also send us a message via MyChart. We typically respond to MyChart messages within 1-2 business days.  For prescription refills, please ask your pharmacy to contact our office. Our fax number is [phone].  If you have an urgent issue when the clinic is closed that cannot wait until the next business day, you can page your doctor at the number below.    Please note that while we do our best to be available for urgent issues outside of office hours, we are not available 24/7.   If you have an urgent issue and are unable to reach us, you may choose to seek medical care at your doctor's office, retail clinic, urgent care center, or emergency room.  If you have a medical emergency, please immediately call 911 or go to the emergency department.  Pager Numbers  - Dr. Kowalski: [phone]  - Dr. Moye: [phone]  - Dr. Stewart:  [phone]  In the event of inclement weather, please call our main line at [phone] for an update on the status of any delays or closures.  Dermatology Medication Tips: Please keep the boxes that topical medications come in in order to help keep track of the instructions about where and how to use these. Pharmacies typically print the medication instructions only on the boxes and not directly on the medication tubes.   If your medication is too expensive, please contact our office at [phone] option 4 or send us a message through MyChart.   We are unable to tell what your co-pay for medications will be in advance as this is different depending on your insurance coverage. However, we may be able to find a substitute medication at lower cost or fill out paperwork to get insurance to cover a needed medication.   If a prior authorization is required to get your medication covered by your insurance company, please allow us 1-2 business days to complete this process.  Drug prices often vary depending on where the prescription is filled and some pharmacies may offer cheaper prices.  The website www.goodrx.com contains coupons for medications through different pharmacies. The prices here do not account for what the cost may be with help from insurance (it may be cheaper with your insurance), but the website can give you the price if you did not use any insurance.  - You can print the associated coupon and take it with   your prescription to the pharmacy.  - You may also stop by our office during regular business hours and pick up a GoodRx coupon card.  - If you need your prescription sent electronically to a different pharmacy, notify our office through [facility] MyChart or by phone at [phone] option 4.     Si Usted Necesita Algo Despus de Su Visita  Tambin puede enviarnos un mensaje a travs de MyChart. Por lo general respondemos a los mensajes de MyChart en el transcurso de 1 a 2  das hbiles.  Para renovar recetas, por favor pida a su farmacia que se ponga en contacto con nuestra oficina. Nuestro nmero de fax es el [phone].  Si tiene un asunto urgente cuando la clnica est cerrada y que no puede esperar hasta el siguiente da hbil, puede llamar/localizar a su doctor(a) al nmero que aparece a continuacin.   Por favor, tenga en cuenta que aunque hacemos todo lo posible para estar disponibles para asuntos urgentes fuera del horario de oficina, no estamos disponibles las 24 horas del da, los 7 das de la semana.   Si tiene un problema urgente y no puede comunicarse con nosotros, puede optar por buscar atencin mdica  en el consultorio de su doctor(a), en una clnica privada, en un centro de atencin urgente o en una sala de emergencias.  Si tiene una emergencia mdica, por favor llame inmediatamente al 911 o vaya a la sala de emergencias.  Nmeros de bper  - Dr. Kowalski: [phone]  - Dra. Moye: [phone]  - Dra. Stewart: [phone]  En caso de inclemencias del tiempo, por favor llame a nuestra lnea principal al [phone] para una actualizacin sobre el estado de cualquier retraso o cierre.  Consejos para la medicacin en dermatologa: Por favor, guarde las cajas en las que vienen los medicamentos de uso tpico para ayudarle a seguir las instrucciones sobre dnde y cmo usarlos. Las farmacias generalmente imprimen las instrucciones del medicamento slo en las cajas y no directamente en los tubos del medicamento.   Si su medicamento es muy caro, por favor, pngase en contacto con nuestra oficina llamando al [phone] y presione la opcin 4 o envenos un mensaje a travs de MyChart.   No podemos decirle cul ser su copago por los medicamentos por adelantado ya que esto es diferente dependiendo de la cobertura de su seguro. Sin embargo, es posible que podamos encontrar un medicamento sustituto a menor costo o llenar un formulario para que el  seguro cubra el medicamento que se considera necesario.   Si se requiere una autorizacin previa para que su compaa de seguros cubra su medicamento, por favor permtanos de 1 a 2 das hbiles para completar este proceso.  Los precios de los medicamentos varan con frecuencia dependiendo del lugar de dnde se surte la receta y alguna farmacias pueden ofrecer precios ms baratos.  El sitio web www.goodrx.com tiene cupones para medicamentos de diferentes farmacias. Los precios aqu no tienen en cuenta lo que podra costar con la ayuda del seguro (puede ser ms barato con su seguro), pero el sitio web puede darle el precio si no utiliz ningn seguro.  - Puede imprimir el cupn correspondiente y llevarlo con su receta a la farmacia.  - Tambin puede pasar por nuestra oficina durante el horario de atencin regular y recoger una tarjeta de cupones de GoodRx.  - Si necesita que su receta se enve electrnicamente a una farmacia diferente, informe a nuestra oficina a travs de MyChart de [place]   o por telfono llamando al [phone] y presione la opcin 4.  

## 2022-05-30 NOTE — Progress Notes (Signed)
   Follow-Up Visit   Subjective  [name] is a 71 y.o. female who presents for the following: Facial Elastosis (Patient is here today for Botox injections).  The following portions of the chart were reviewed this encounter and updated as appropriate:   Tobacco  Allergies  Meds  Problems  Med Hx  Surg Hx  Fam Hx     Review of Systems:  No other skin or systemic complaints except as noted in HPI or Assessment and Plan.  Objective  Well appearing patient in no apparent distress; mood and affect are within normal limits.  A focused examination was performed including the face. Relevant physical exam findings are noted in the Assessment and Plan.  Face Rhytides and volume loss.       Assessment & Plan  Elastosis of skin Face  Botox 20 units injected as marked  Botox Injection - Face Location: See attached image  Informed consent: Discussed risks (infection, pain, bleeding, bruising, swelling, allergic reaction, paralysis of nearby muscles, eyelid droop, double vision, neck weakness, difficulty breathing, headache, undesirable cosmetic result, and need for additional treatment) and benefits of the procedure, as well as the alternatives.  Informed consent was obtained.  Preparation: The area was cleansed with alcohol.  Procedure Details:  Botox was injected into the dermis with a 30-gauge needle. Pressure applied to any bleeding. Ice packs offered for swelling.  Lot Number:  [id number] Expiration:  04/2024  Total Units Injected:  20  Plan: Patient was instructed to remain upright for 4 hours. Patient was instructed to avoid massaging the face and avoid vigorous exercise for the rest of the day. Tylenol may be used for headache.  Allow 2 weeks before returning to clinic for additional dosing as needed. Patient will call for any problems.  Return in about 4 months (around 09/29/2022) for Botox injections.  Luther Redo, CMA, am acting as scribe for Sarina Ser, MD  . Documentation: I have reviewed the above documentation for accuracy and completeness, and I agree with the above.  Sarina Ser, MD

## 2022-06-01 ENCOUNTER — Encounter: Payer: Self-pay | Admitting: Dermatology

## 2022-06-12 ENCOUNTER — Other Ambulatory Visit (INDEPENDENT_AMBULATORY_CARE_PROVIDER_SITE_OTHER): Payer: Medicare Other

## 2022-06-12 DIAGNOSIS — E785 Hyperlipidemia, unspecified: Secondary | ICD-10-CM

## 2022-06-12 LAB — LIPID PANEL
Cholesterol: 158 mg/dL (ref 0–200)
HDL: 68 mg/dL (ref >39.00)
LDL Cholesterol: 75 mg/dL (ref 0–99)
NonHDL: 89.79
Total CHOL/HDL Ratio: 2
Triglycerides: 73 mg/dL (ref 0.0–149.0)
VLDL: 14.6 mg/dL (ref 0.0–40.0)

## 2022-08-01 ENCOUNTER — Encounter: Payer: Self-pay | Admitting: Psychiatry

## 2022-08-01 ENCOUNTER — Telehealth (INDEPENDENT_AMBULATORY_CARE_PROVIDER_SITE_OTHER): Payer: Medicare Other | Admitting: Psychiatry

## 2022-08-01 DIAGNOSIS — F4312 Post-traumatic stress disorder, chronic: Secondary | ICD-10-CM

## 2022-08-01 DIAGNOSIS — F411 Generalized anxiety disorder: Secondary | ICD-10-CM | POA: Diagnosis not present

## 2022-08-01 MED ORDER — PROPRANOLOL HCL 10 MG PO TABS: 10.0000 mg | ORAL_TABLET | Freq: Three times a day (TID) | ORAL | 3 refills | Status: AC | PRN

## 2022-08-01 MED ORDER — MIRTAZAPINE 7.5 MG PO TABS: 7.5000 mg | ORAL_TABLET | Freq: Every day | ORAL | 3 refills | Status: DC

## 2022-08-01 NOTE — Progress Notes (Unsigned)
Virtual Visit via Video Note  I connected with [name] on 08/01/22 at  2:00 PM EDT by a video enabled telemedicine application and verified that I am speaking with the correct person using two identifiers.  Location Provider Location : [facility] Patient Location : Home  Participants: Patient , Provider   I discussed the limitations of evaluation and management by telemedicine and the availability of in person appointments. The patient expressed understanding and agreed to proceed.   I discussed the assessment and treatment plan with the patient. The patient was provided an opportunity to ask questions and all were answered. The patient agreed with the plan and demonstrated an understanding of the instructions.   The patient was advised to call back or seek an in-person evaluation if the symptoms worsen or if the condition fails to improve as anticipated.  [name] Progress Note  08/02/2022 12:58 PM [name]  MRN:  [id number]  Chief Complaint:  Chief Complaint  Patient presents with   Follow-up: 71 year old Caucasian female with history of PTSD, GAD, presented for medication management.   HPI: [name] is a 71 year old Caucasian female, retired, married, lives in [place], has a history of PTSD, GAD, was evaluated by telemedicine today.  Patient today reports she is currently doing well with regards to her mood.  Denies any significant depression symptoms.  Reports she does have anxiety usually situational although it does not affect her much.  She has been coping okay.  Reports sleep is good.  Currently takes mirtazapine for sleep and that has been beneficial.  Denies side effects.  Patient reports she has been taking 10,000 steps daily, managing her diet and hence has been managing her weight.  Patient reports her husband retired and she has been staying busy this summer.  Patient had neurology evaluation recently, was told she does not have Parkinson's disease.  She  does have a history of essential tremors.  Patient denies any suicidality, homicidality or perceptual disturbances.  Reports she continues to follow-up with [name], therapist.  That has been helpful.  Patient denies any other concerns today.  Visit Diagnosis:    ICD-10-CM   1. GAD (generalized anxiety disorder)  F41.1 mirtazapine (REMERON) 7.5 MG tablet    propranolol (INDERAL) 10 MG tablet    2. Chronic post-traumatic stress disorder (PTSD)  F43.12       Past Psychiatric History: Reviewed past psychiatric history from progress note on 03/11/2018.  Past Medical History:  Past Medical History:  Diagnosis Date   Anxiety    Basal cell carcinoma    L thigh, txted ~1990 in [place]   Rosacea    ocular, on chronic doxycycline treatment   Thyroid disease     Past Surgical History:  Procedure Laterality Date   ABDOMINAL HYSTERECTOMY  1992   Total   BREAST BIOPSY Right 07/20/2015   neg fat necrosis   BREAST EXCISIONAL BIOPSY Right 1960's   negative. No scar seen   CERVICAL FUSION  2005,2010   C2-C7   REDUCTION MAMMAPLASTY Bilateral 2016   TONSILLECTOMY  1961    Family Psychiatric History: Reviewed family psychiatric history from progress note on 03/11/2018.  Family History:  Family History  Problem Relation Age of Onset   Cancer Mother        Uterine, Ovarian   Anxiety disorder Mother    Depression Mother    Heart disease Father    Stroke Father    Diabetes Father    ADD / ADHD  Daughter    Cancer Maternal Grandmother    ADD / ADHD Grandchild    Prostate cancer Neg Hx    Kidney cancer Neg Hx    Breast cancer Neg Hx    Colon cancer Neg Hx     Social History: Reviewed social history from progress note on 03/11/2018. Social History   Socioeconomic History   Marital status: Married    Spouse name: jeff   Number of children: 2   Years of education: Not on file   Highest education level: Master's degree (e.g., MA, MS, MEng, MEd, MSW, MBA)  Occupational  History    Comment: retired  Tobacco Use   Smoking status: Never   Smokeless tobacco: Never  Vaping Use   Vaping Use: Never used  Substance and Sexual Activity   Alcohol use: Yes    Alcohol/week: 1.0 standard drink of alcohol    Types: 1 Glasses of wine per week    Comment: occasional to rare use   Drug use: No   Sexual activity: Yes    Birth control/protection: None  Other Topics Concern   Not on file  Social History Narrative   Married 2004   3 kids total (she has two, huband has one)   4 grandkids.     Retired.     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   Social Determinants of Health   Financial Resource Strain: Low Risk  (11/21/2017)   Overall Financial Resource Strain (CARDIA)    Difficulty of Paying Living Expenses: Not hard at all  Food Insecurity: No Food Insecurity (11/21/2017)   Hunger Vital Sign    Worried About Running Out of Food in the Last Year: Never true    Ran Out of Food in the Last Year: Never true  Transportation Needs: No Transportation Needs (11/21/2017)   PRAPARE - Hydrologist (Medical): No    Lack of Transportation (Non-Medical): No  Physical Activity: Inactive (11/21/2017)   Exercise Vital Sign    Days of Exercise per Week: 0 days    Minutes of Exercise per Session: 0 min  Stress: Stress Concern Present (11/21/2017)   [place]    Feeling of Stress : Very much  Social Connections: Moderately Integrated (11/21/2017)   Social Connection and Isolation Panel [NHANES]    Frequency of Communication with Friends and Family: More than three times a week    Frequency of Social Gatherings with Friends and Family: Three times a week    Attends Religious Services: More than 4 times per year    Active Member of Clubs or Organizations: No    Attends Archivist Meetings: Never    Marital Status: Married     Allergies:  Allergies  Allergen Reactions   Codeine Hives   Iodinated Contrast Media Itching   Metrizamide Itching    Metabolic Disorder Labs: No results found for: "HGBA1C", "MPG" No results found for: "PROLACTIN" Lab Results  Component Value Date   CHOL 158 06/12/2022   TRIG 73.0 06/12/2022   HDL 68.00 06/12/2022   CHOLHDL 2 06/12/2022   VLDL 14.6 06/12/2022   LDLCALC 75 06/12/2022   LDLCALC 117 (H) 02/27/2022   Lab Results  Component Value Date   TSH 2.140 10/10/2021   TSH 1.860 11/02/2020    Therapeutic Level Labs: No results found for: "LITHIUM" No results found for: "VALPROATE" No results found for: "CBMZ"  Current Medications: Current Outpatient Medications  Medication Sig Dispense Refill   acyclovir (ZOVIRAX) 200 MG capsule Take 1 capsule (200 mg total) by mouth 3 (three) times daily as needed.     Biotin 5 MG CAPS Take by mouth.     Cholecalciferol (VITAMIN D3) 50 MCG (2000 UT) capsule Take by mouth.     doxycycline (VIBRAMYCIN) 50 MG capsule Take 1 capsule (50 mg total) by mouth 2 (two) times daily. 180 capsule 1   ibuprofen (ADVIL,MOTRIN) 200 MG tablet Take 200 mg by mouth every 6 (six) hours as needed for moderate pain.     levothyroxine (SYNTHROID) 75 MCG tablet TAKE 1 TABLET BY MOUTH  DAILY BEFORE BREAKFAST 90 tablet 3   Multiple Vitamins-Minerals (OCUVITE ADULT 50+ PO) Take by mouth.     Omega-3 Fatty Acids (FISH OIL) 1000 MG CAPS Take by mouth.     simvastatin (ZOCOR) 10 MG tablet Take 10 mg by mouth daily.     solifenacin (VESICARE) 10 MG tablet Take by mouth daily.     White Petrolatum-Mineral Oil (GENTEAL TEARS NIGHT-TIME) OINT Apply to eye.     mirtazapine (REMERON) 7.5 MG tablet Take 1 tablet (7.5 mg total) by mouth at bedtime. 90 tablet 3   propranolol (INDERAL) 10 MG tablet Take 1 tablet (10 mg total) by mouth 3 (three) times daily as needed. 270 tablet 3   No current facility-administered medications for this visit.      Musculoskeletal: Strength & Muscle Tone:  UTA Gait & Station:  Seated Patient leans: N/A  Psychiatric Specialty Exam: Review of Systems  Neurological:  Positive for tremors (Chronic).  Psychiatric/Behavioral: Negative.    All other systems reviewed and are negative.   There were no vitals taken for this visit.There is no height or weight on file to calculate BMI.  General Appearance: Casual  Eye Contact:  Fair  Speech:  Clear and Coherent  Volume:  Normal  Mood:  Euthymic  Affect:  Congruent  Thought Process:  Goal Directed and Descriptions of Associations: Intact  Orientation:  Full (Time, Place, and Person)  Thought Content: Logical   Suicidal Thoughts:  No  Homicidal Thoughts:  No  Memory:  Immediate;   Fair Recent;   Fair Remote;   Fair  Judgement:  Fair  Insight:  Fair  Psychomotor Activity:  Tremor chronic, postural  Concentration:  Concentration: Fair and Attention Span: Fair  Recall:  [employer] of Knowledge: Fair  Language: Fair  Akathisia:  No  Handed:  Right  AIMS (if indicated): not done  Assets:  Communication Skills Desire for Improvement Housing Intimacy Talents/Skills Transportation  ADL's:  Intact  Cognition: WNL  Sleep:  Fair   Screenings: GAD-7    Flowsheet Row Video Visit from 08/01/2022 in [place] Video Visit from 02/02/2022 in [place] Video Visit from 07/21/2021 in [place] Video Visit from 01/12/2021 in [place] Visit from 11/02/2020 in [place] at [facility]  Total GAD-7 Score '3 6 3 3 '$ 0      PHQ2-9    [place] Video Visit from 08/01/2022 in [place] Video Visit from 02/02/2022 in [place] Video Visit from 07/21/2021 in [place] Visit from 11/02/2020 in [place] at [facility] Visit from 10/30/2019 in [place] at [facility] Total Score 1 0 0 0 0  PHQ-9 Total Score -- -- --  0 --      Flowsheet Row Video Visit from 08/01/2022 in [place] Video Visit from 07/21/2021 in [place] No Risk No Risk        Assessment and Plan: [name] is a 71 year old Caucasian female who is married, retired, has a history of PTSD, anxiety disorder was evaluated by telemedicine today.  Patient is currently stable.  Plan GAD-stable Mirtazapine 7.5 mg p.o. nightly for sleep Propranolol 10 mg p.o. 3 times daily as needed Continue CBT with [name]  PTSD-stable Mirtazapine 7.5 mg p.o. nightly for sleep Continue CBT  Reviewed notes per Dr. Manuella Ghazi dated 04/27/2022-neurology-benign essential tremor in head and hands starting in her 17s with changes in severity with the intention and at rest, family.  History of tremor and mother, patient discussed concerns for parkinsonian symptoms-examination not consistent with Parkinson's at present."  Also reviewed most recent MRI brain-dated 05/05/2022-negative for acute intracranial abnormality.  Follow-up in clinic in 6 to 8 months or sooner if needed.  Collaboration of Care: Collaboration of Care: Referral or follow-up with counselor/therapist AEB encouraged to follow up with therapist.  Patient/Guardian was advised Release of Information must be obtained prior to any record release in order to collaborate their care with an outside provider. Patient/Guardian was advised if they have not already done so to contact the registration department to sign all necessary forms in order for [place] to release information regarding their care.   Consent: Patient/Guardian gives verbal consent for treatment and assignment of benefits for services provided during this visit. Patient/Guardian expressed understanding and agreed to proceed.    This note was generated in part or whole with voice recognition software. Voice recognition is usually quite accurate but there are transcription errors that can and very often do occur. I apologize for any typographical errors that were not detected and corrected.      Ursula Alert, MD 08/02/2022, 12:58 PM

## 2022-08-29 ENCOUNTER — Encounter: Payer: Self-pay | Admitting: Dermatology

## 2022-08-29 ENCOUNTER — Ambulatory Visit (INDEPENDENT_AMBULATORY_CARE_PROVIDER_SITE_OTHER): Payer: Self-pay | Admitting: Dermatology

## 2022-08-29 DIAGNOSIS — L988 Other specified disorders of the skin and subcutaneous tissue: Secondary | ICD-10-CM

## 2022-08-29 NOTE — Patient Instructions (Signed)
Due to recent changes in healthcare laws, you may see results of your pathology and/or laboratory studies on MyChart before the doctors have had a chance to review them. We understand that in some cases there may be results that are confusing or concerning to you. Please understand that not all results are received at the same time and often the doctors may need to interpret multiple results in order to provide you with the best plan of care or course of treatment. Therefore, we ask that you please give us 2 business days to thoroughly review all your results before contacting the office for clarification. Should we see a critical lab result, you will be contacted sooner.   If You Need Anything After Your Visit  If you have any questions or concerns for your doctor, please call our main line at [phone] and press option 4 to reach your doctor's medical assistant. If no one answers, please leave a voicemail as directed and we will return your call as soon as possible. Messages left after 4 pm will be answered the following business day.   You may also send us a message via MyChart. We typically respond to MyChart messages within 1-2 business days.  For prescription refills, please ask your pharmacy to contact our office. Our fax number is [phone].  If you have an urgent issue when the clinic is closed that cannot wait until the next business day, you can page your doctor at the number below.    Please note that while we do our best to be available for urgent issues outside of office hours, we are not available 24/7.   If you have an urgent issue and are unable to reach us, you may choose to seek medical care at your doctor's office, retail clinic, urgent care center, or emergency room.  If you have a medical emergency, please immediately call 911 or go to the emergency department.  Pager Numbers  - Dr. Kowalski: [phone]  - Dr. Moye: [phone]  - Dr. Stewart:  [phone]  In the event of inclement weather, please call our main line at [phone] for an update on the status of any delays or closures.  Dermatology Medication Tips: Please keep the boxes that topical medications come in in order to help keep track of the instructions about where and how to use these. Pharmacies typically print the medication instructions only on the boxes and not directly on the medication tubes.   If your medication is too expensive, please contact our office at [phone] option 4 or send us a message through MyChart.   We are unable to tell what your co-pay for medications will be in advance as this is different depending on your insurance coverage. However, we may be able to find a substitute medication at lower cost or fill out paperwork to get insurance to cover a needed medication.   If a prior authorization is required to get your medication covered by your insurance company, please allow us 1-2 business days to complete this process.  Drug prices often vary depending on where the prescription is filled and some pharmacies may offer cheaper prices.  The website www.goodrx.com contains coupons for medications through different pharmacies. The prices here do not account for what the cost may be with help from insurance (it may be cheaper with your insurance), but the website can give you the price if you did not use any insurance.  - You can print the associated coupon and take it with   your prescription to the pharmacy.  - You may also stop by our office during regular business hours and pick up a GoodRx coupon card.  - If you need your prescription sent electronically to a different pharmacy, notify our office through [place] MyChart or by phone at [phone] option 4.     Si Usted Necesita Algo Despus de Su Visita  Tambin puede enviarnos un mensaje a travs de MyChart. Por lo general respondemos a los mensajes de MyChart en el transcurso de 1 a 2  das hbiles.  Para renovar recetas, por favor pida a su farmacia que se ponga en contacto con nuestra oficina. Nuestro nmero de fax es el [phone].  Si tiene un asunto urgente cuando la clnica est cerrada y que no puede esperar hasta el siguiente da hbil, puede llamar/localizar a su doctor(a) al nmero que aparece a continuacin.   Por favor, tenga en cuenta que aunque hacemos todo lo posible para estar disponibles para asuntos urgentes fuera del horario de oficina, no estamos disponibles las 24 horas del da, los 7 das de la semana.   Si tiene un problema urgente y no puede comunicarse con nosotros, puede optar por buscar atencin mdica  en el consultorio de su doctor(a), en una clnica privada, en un centro de atencin urgente o en una sala de emergencias.  Si tiene una emergencia mdica, por favor llame inmediatamente al 911 o vaya a la sala de emergencias.  Nmeros de bper  - Dr. Kowalski: [phone]  - Dra. Moye: [phone]  - Dra. Stewart: [phone]  En caso de inclemencias del tiempo, por favor llame a nuestra lnea principal al [phone] para una actualizacin sobre el estado de cualquier retraso o cierre.  Consejos para la medicacin en dermatologa: Por favor, guarde las cajas en las que vienen los medicamentos de uso tpico para ayudarle a seguir las instrucciones sobre dnde y cmo usarlos. Las farmacias generalmente imprimen las instrucciones del medicamento slo en las cajas y no directamente en los tubos del medicamento.   Si su medicamento es muy caro, por favor, pngase en contacto con nuestra oficina llamando al [phone] y presione la opcin 4 o envenos un mensaje a travs de MyChart.   No podemos decirle cul ser su copago por los medicamentos por adelantado ya que esto es diferente dependiendo de la cobertura de su seguro. Sin embargo, es posible que podamos encontrar un medicamento sustituto a menor costo o llenar un formulario para que el  seguro cubra el medicamento que se considera necesario.   Si se requiere una autorizacin previa para que su compaa de seguros cubra su medicamento, por favor permtanos de 1 a 2 das hbiles para completar este proceso.  Los precios de los medicamentos varan con frecuencia dependiendo del lugar de dnde se surte la receta y alguna farmacias pueden ofrecer precios ms baratos.  El sitio web www.goodrx.com tiene cupones para medicamentos de diferentes farmacias. Los precios aqu no tienen en cuenta lo que podra costar con la ayuda del seguro (puede ser ms barato con su seguro), pero el sitio web puede darle el precio si no utiliz ningn seguro.  - Puede imprimir el cupn correspondiente y llevarlo con su receta a la farmacia.  - Tambin puede pasar por nuestra oficina durante el horario de atencin regular y recoger una tarjeta de cupones de GoodRx.  - Si necesita que su receta se enve electrnicamente a una farmacia diferente, informe a nuestra oficina a travs de MyChart de [place]   o por telfono llamando al [phone] y presione la opcin 4.  

## 2022-08-29 NOTE — Progress Notes (Signed)
   Follow-Up Visit   Subjective  [name] is a 71 y.o. female who presents for the following: Facial Elastosis (Botox today).  The following portions of the chart were reviewed this encounter and updated as appropriate:   Tobacco  Allergies  Meds  Problems  Med Hx  Surg Hx  Fam Hx     Review of Systems:  No other skin or systemic complaints except as noted in HPI or Assessment and Plan.  Objective  Well appearing patient in no apparent distress; mood and affect are within normal limits.  A focused examination was performed including face. Relevant physical exam findings are noted in the Assessment and Plan.  Face Rhytides and volume loss.       Assessment & Plan  Elastosis of skin Face  Botox Injection - Face Location: See attached image  Informed consent: Discussed risks (infection, pain, bleeding, bruising, swelling, allergic reaction, paralysis of nearby muscles, eyelid droop, double vision, neck weakness, difficulty breathing, headache, undesirable cosmetic result, and need for additional treatment) and benefits of the procedure, as well as the alternatives.  Informed consent was obtained.  Preparation: The area was cleansed with alcohol.  Procedure Details:  Botox was injected into the dermis with a 30-gauge needle. Pressure applied to any bleeding. Ice packs offered for swelling.  Lot Number:  [id number] Expiration:  10/2024  Total Units Injected:  20  Plan: Patient was instructed to remain upright for 4 hours. Patient was instructed to avoid massaging the face and avoid vigorous exercise for the rest of the day. Tylenol may be used for headache.  Allow 2 weeks before returning to clinic for additional dosing as needed. Patient will call for any problems.  Return for Botox in 3-4 months.  I, Ashok Cordia, CMA, am acting as scribe for Sarina Ser, MD . Documentation: I have reviewed the above documentation for accuracy and completeness, and I agree with  the above.  Sarina Ser, MD

## 2022-10-05 ENCOUNTER — Ambulatory Visit (INDEPENDENT_AMBULATORY_CARE_PROVIDER_SITE_OTHER): Payer: Medicare Other | Admitting: Dermatology

## 2022-10-05 DIAGNOSIS — Z85828 Personal history of other malignant neoplasm of skin: Secondary | ICD-10-CM

## 2022-10-05 DIAGNOSIS — L578 Other skin changes due to chronic exposure to nonionizing radiation: Secondary | ICD-10-CM

## 2022-10-05 DIAGNOSIS — L814 Other melanin hyperpigmentation: Secondary | ICD-10-CM

## 2022-10-05 DIAGNOSIS — L821 Other seborrheic keratosis: Secondary | ICD-10-CM | POA: Diagnosis not present

## 2022-10-05 DIAGNOSIS — Z1283 Encounter for screening for malignant neoplasm of skin: Secondary | ICD-10-CM | POA: Diagnosis not present

## 2022-10-05 DIAGNOSIS — D229 Melanocytic nevi, unspecified: Secondary | ICD-10-CM

## 2022-10-05 NOTE — Patient Instructions (Signed)
Due to recent changes in healthcare laws, you may see results of your pathology and/or laboratory studies on MyChart before the doctors have had a chance to review them. We understand that in some cases there may be results that are confusing or concerning to you. Please understand that not all results are received at the same time and often the doctors may need to interpret multiple results in order to provide you with the best plan of care or course of treatment. Therefore, we ask that you please give us 2 business days to thoroughly review all your results before contacting the office for clarification. Should we see a critical lab result, you will be contacted sooner.   If You Need Anything After Your Visit  If you have any questions or concerns for your doctor, please call our main line at [phone] and press option 4 to reach your doctor's medical assistant. If no one answers, please leave a voicemail as directed and we will return your call as soon as possible. Messages left after 4 pm will be answered the following business day.   You may also send us a message via MyChart. We typically respond to MyChart messages within 1-2 business days.  For prescription refills, please ask your pharmacy to contact our office. Our fax number is [phone].  If you have an urgent issue when the clinic is closed that cannot wait until the next business day, you can page your doctor at the number below.    Please note that while we do our best to be available for urgent issues outside of office hours, we are not available 24/7.   If you have an urgent issue and are unable to reach us, you may choose to seek medical care at your doctor's office, retail clinic, urgent care center, or emergency room.  If you have a medical emergency, please immediately call 911 or go to the emergency department.  Pager Numbers  - Dr. Kowalski: [phone]  - Dr. Moye: [phone]  - Dr. Stewart:  [phone]  In the event of inclement weather, please call our main line at [phone] for an update on the status of any delays or closures.  Dermatology Medication Tips: Please keep the boxes that topical medications come in in order to help keep track of the instructions about where and how to use these. Pharmacies typically print the medication instructions only on the boxes and not directly on the medication tubes.   If your medication is too expensive, please contact our office at [phone] option 4 or send us a message through MyChart.   We are unable to tell what your co-pay for medications will be in advance as this is different depending on your insurance coverage. However, we may be able to find a substitute medication at lower cost or fill out paperwork to get insurance to cover a needed medication.   If a prior authorization is required to get your medication covered by your insurance company, please allow us 1-2 business days to complete this process.  Drug prices often vary depending on where the prescription is filled and some pharmacies may offer cheaper prices.  The website www.goodrx.com contains coupons for medications through different pharmacies. The prices here do not account for what the cost may be with help from insurance (it may be cheaper with your insurance), but the website can give you the price if you did not use any insurance.  - You can print the associated coupon and take it with   your prescription to the pharmacy.  - You may also stop by our office during regular business hours and pick up a GoodRx coupon card.  - If you need your prescription sent electronically to a different pharmacy, notify our office through [facility] MyChart or by phone at [phone] option 4.     Si Usted Necesita Algo Despus de Su Visita  Tambin puede enviarnos un mensaje a travs de MyChart. Por lo general respondemos a los mensajes de MyChart en el transcurso de 1 a 2  das hbiles.  Para renovar recetas, por favor pida a su farmacia que se ponga en contacto con nuestra oficina. Nuestro nmero de fax es el [phone].  Si tiene un asunto urgente cuando la clnica est cerrada y que no puede esperar hasta el siguiente da hbil, puede llamar/localizar a su doctor(a) al nmero que aparece a continuacin.   Por favor, tenga en cuenta que aunque hacemos todo lo posible para estar disponibles para asuntos urgentes fuera del horario de oficina, no estamos disponibles las 24 horas del da, los 7 das de la semana.   Si tiene un problema urgente y no puede comunicarse con nosotros, puede optar por buscar atencin mdica  en el consultorio de su doctor(a), en una clnica privada, en un centro de atencin urgente o en una sala de emergencias.  Si tiene una emergencia mdica, por favor llame inmediatamente al 911 o vaya a la sala de emergencias.  Nmeros de bper  - Dr. Kowalski: [phone]  - Dra. Moye: [phone]  - Dra. Stewart: [phone]  En caso de inclemencias del tiempo, por favor llame a nuestra lnea principal al [phone] para una actualizacin sobre el estado de cualquier retraso o cierre.  Consejos para la medicacin en dermatologa: Por favor, guarde las cajas en las que vienen los medicamentos de uso tpico para ayudarle a seguir las instrucciones sobre dnde y cmo usarlos. Las farmacias generalmente imprimen las instrucciones del medicamento slo en las cajas y no directamente en los tubos del medicamento.   Si su medicamento es muy caro, por favor, pngase en contacto con nuestra oficina llamando al [phone] y presione la opcin 4 o envenos un mensaje a travs de MyChart.   No podemos decirle cul ser su copago por los medicamentos por adelantado ya que esto es diferente dependiendo de la cobertura de su seguro. Sin embargo, es posible que podamos encontrar un medicamento sustituto a menor costo o llenar un formulario para que el  seguro cubra el medicamento que se considera necesario.   Si se requiere una autorizacin previa para que su compaa de seguros cubra su medicamento, por favor permtanos de 1 a 2 das hbiles para completar este proceso.  Los precios de los medicamentos varan con frecuencia dependiendo del lugar de dnde se surte la receta y alguna farmacias pueden ofrecer precios ms baratos.  El sitio web www.goodrx.com tiene cupones para medicamentos de diferentes farmacias. Los precios aqu no tienen en cuenta lo que podra costar con la ayuda del seguro (puede ser ms barato con su seguro), pero el sitio web puede darle el precio si no utiliz ningn seguro.  - Puede imprimir el cupn correspondiente y llevarlo con su receta a la farmacia.  - Tambin puede pasar por nuestra oficina durante el horario de atencin regular y recoger una tarjeta de cupones de GoodRx.  - Si necesita que su receta se enve electrnicamente a una farmacia diferente, informe a nuestra oficina a travs de MyChart de [place]   o por telfono llamando al [phone] y presione la opcin 4.  

## 2022-10-05 NOTE — Progress Notes (Signed)
   Follow-Up Visit   Subjective  [name] is a 71 y.o. female who presents for the following: Annual Exam. Hx of BCC on the left thigh removed and treated in 1990. The patient presents for Total-Body Skin Exam (TBSE) for skin cancer screening and mole check.  The patient has spots, moles and lesions to be evaluated, some may be new or changing and the patient has concerns that these could be cancer.   The following portions of the chart were reviewed this encounter and updated as appropriate:   Tobacco  Allergies  Meds  Problems  Med Hx  Surg Hx  Fam Hx     Review of Systems:  No other skin or systemic complaints except as noted in HPI or Assessment and Plan.  Objective  Well appearing patient in no apparent distress; mood and affect are within normal limits.  A full examination was performed including scalp, head, eyes, ears, nose, lips, neck, chest, axillae, abdomen, back, buttocks, bilateral upper extremities, bilateral lower extremities, hands, feet, fingers, toes, fingernails, and toenails. All findings within normal limits unless otherwise noted below.  trunk, exts Stuck-on, waxy, tan-brown papules and plaques -- Discussed benign etiology and prognosis.    Assessment & Plan  Seborrheic keratosis trunk, exts Reassured benign age-related growth.  Recommend observation.  Discussed cryotherapy if spot(s) become irritated or inflamed.   Lentigines - Scattered tan macules - Due to sun exposure - Benign-appearing, observe - Recommend daily broad spectrum sunscreen SPF 30+ to sun-exposed areas, reapply every 2 hours as needed. - Call for any changes  Melanocytic Nevi - Tan-brown and/or pink-flesh-colored symmetric macules and papules - Benign appearing on exam today - Observation - Call clinic for new or changing moles - Recommend daily use of broad spectrum spf 30+ sunscreen to sun-exposed areas.   Hemangiomas - Red papules - Discussed benign nature - Observe -  Call for any changes  Actinic Damage - Chronic condition, secondary to cumulative UV/sun exposure - diffuse scaly erythematous macules with underlying dyspigmentation - Recommend daily broad spectrum sunscreen SPF 30+ to sun-exposed areas, reapply every 2 hours as needed.  - Staying in the shade or wearing long sleeves, sun glasses (UVA+UVB protection) and wide brim hats (4-inch brim around the entire circumference of the hat) are also recommended for sun protection.  - Call for new or changing lesions.  Skin cancer screening performed today.   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  - Call if any new or changing lesions are noted between office visits   Return in about 1 year (around 10/06/2023) for TBSE, hx of BCC.  IMarye Round, CMA, am acting as scribe for Sarina Ser, MD .  Documentation: I have reviewed the above documentation for accuracy and completeness, and I agree with the above.  Sarina Ser, MD

## 2022-10-09 ENCOUNTER — Encounter: Payer: Self-pay | Admitting: Dermatology

## 2022-11-06 ENCOUNTER — Ambulatory Visit (INDEPENDENT_AMBULATORY_CARE_PROVIDER_SITE_OTHER): Payer: Medicare Other

## 2022-11-06 ENCOUNTER — Ambulatory Visit (INDEPENDENT_AMBULATORY_CARE_PROVIDER_SITE_OTHER): Payer: Medicare Other | Admitting: Sports Medicine

## 2022-11-06 VITALS — BP 110/80 | HR 79 | Ht 65.0 in | Wt 152.0 lb

## 2022-11-06 DIAGNOSIS — M25561 Pain in right knee: Secondary | ICD-10-CM

## 2022-11-06 MED ORDER — MELOXICAM 15 MG PO TABS: 15.0000 mg | ORAL_TABLET | Freq: Every day | ORAL | 0 refills | Status: DC

## 2022-11-06 NOTE — Patient Instructions (Signed)
Good to see you - Start meloxicam 15 mg daily x2 weeks.  If still having pain after 2 weeks, complete 3rd-week of meloxicam. May use remaining meloxicam as needed once daily for pain control.  Do not to use additional NSAIDs while taking meloxicam.  May use Tylenol [phone] mg 2 to 3 times a day for breakthrough pain. Relative rest for 2 weeks Knee HEP  3-4 week follow up

## 2022-11-06 NOTE — Progress Notes (Signed)
[name] D.[place] Phone: [phone]   Assessment and Plan:     1. Acute pain of right knee -Acute, unclear etiology, initial sports medicine visit - Most likely flare of osteoarthritis versus acute meniscal tear occurring from patient's fall based on HPI and physical exam - We will start with conservative treatment including HEP and course of NSAIDs - Start meloxicam 15 mg daily x2 weeks.  If still having pain after 2 weeks, complete 3rd-week of meloxicam. May use remaining meloxicam as needed once daily for pain control.  Do not to use additional NSAIDs while taking meloxicam.  May use Tylenol [phone] mg 2 to 3 times a day for breakthrough pain. - Start HEP for knee - Recommend relative rest for the next 2 to 3 weeks - DG Knee AP/LAT W/Sunrise Right; Future  Other orders - meloxicam (MOBIC) 15 MG tablet; Take 1 tablet (15 mg total) by mouth daily.    Pertinent previous records reviewed include none   Follow Up: 3-4 weeks for reevaluation.  If no improvement or worsening of symptoms, could consider CSI.  If patient continues to have episodes of instability, could proceed with MRI imaging   Subjective:   I, [name], am serving as a Education administrator for Doctor Glennon Mac  Chief Complaint: right knee pain   HPI:   11/06/22 Patient is a 71 year old female complaining  of right knee pain. Patient states that she missed stepped on the stairs and twisted it on Saturday night , swelling started Sunday , has rested , feel a bulge on the medial side , aleve last night and that didn't do much , no numbness a little tingling, radiating pain up the leg, has pain when lifting the leg ,      Relevant Historical Information: Hypothyroidism  Additional pertinent review of systems negative.   Current Outpatient Medications:    meloxicam (MOBIC) 15 MG tablet, Take 1 tablet (15 mg total) by mouth daily.,  Disp: 30 tablet, Rfl: 0   acyclovir (ZOVIRAX) 200 MG capsule, Take 1 capsule (200 mg total) by mouth 3 (three) times daily as needed., Disp: , Rfl:    Biotin 5 MG CAPS, Take by mouth., Disp: , Rfl:    Cholecalciferol (VITAMIN D3) 50 MCG (2000 UT) capsule, Take by mouth., Disp: , Rfl:    doxycycline (VIBRAMYCIN) 50 MG capsule, Take 1 capsule (50 mg total) by mouth 2 (two) times daily., Disp: 180 capsule, Rfl: 1   ibuprofen (ADVIL,MOTRIN) 200 MG tablet, Take 200 mg by mouth every 6 (six) hours as needed for moderate pain., Disp: , Rfl:    levothyroxine (SYNTHROID) 75 MCG tablet, TAKE 1 TABLET BY MOUTH  DAILY BEFORE BREAKFAST, Disp: 90 tablet, Rfl: 3   mirtazapine (REMERON) 7.5 MG tablet, Take 1 tablet (7.5 mg total) by mouth at bedtime., Disp: 90 tablet, Rfl: 3   Multiple Vitamins-Minerals (OCUVITE ADULT 50+ PO), Take by mouth., Disp: , Rfl:    Omega-3 Fatty Acids (FISH OIL) 1000 MG CAPS, Take by mouth., Disp: , Rfl:    propranolol (INDERAL) 10 MG tablet, Take 1 tablet (10 mg total) by mouth 3 (three) times daily as needed., Disp: 270 tablet, Rfl: 3   simvastatin (ZOCOR) 10 MG tablet, Take 10 mg by mouth daily., Disp: , Rfl:    solifenacin (VESICARE) 10 MG tablet, Take by mouth daily., Disp: , Rfl:    White Petrolatum-Mineral Oil (GENTEAL TEARS NIGHT-TIME)  OINT, Apply to eye., Disp: , Rfl:    Objective:     Vitals:   11/06/22 1058  BP: 110/80  Pulse: 79  SpO2: 96%  Weight: 152 lb (68.9 kg)  Height: '5\' 5"'$  (1.651 m)      Body mass index is 25.29 kg/m.    Physical Exam:    General:  awake, alert oriented, no acute distress nontoxic Skin: no suspicious lesions or rashes Neuro:sensation intact and strength 5/5 with no deficits, no atrophy, normal muscle tone Psych: No signs of anxiety, depression or other mood disorder  Right knee: Mild swelling No deformity Neg fluid wave, joint milking ROM Flex 100, Ext 5 TTP medial femoral condyle, medial patella NTTP over the quad tendon,   lat  fem condyle,  , plica, patella tendon, tibial tuberostiy, fibular head, posterior fossa, pes anserine bursa, gerdy's tubercle, medial jt line, lateral jt line Neg anterior and posterior drawer Neg lachman Neg sag sign Negative varus stress Negative valgus stress Negative McMurray Positive Thessaly  Gait normal    Electronically signed by:  [name] D.[facility] 11:33 AM 11/06/22

## 2022-11-25 ENCOUNTER — Other Ambulatory Visit: Payer: Self-pay | Admitting: Family Medicine

## 2022-11-28 NOTE — Progress Notes (Unsigned)
    [name] D.[place] Phone: [phone]   Assessment and Plan:     There are no diagnoses linked to this encounter.  ***   Pertinent previous records reviewed include ***   Follow Up: ***     Subjective:   I, Merdith Adan, am serving as a Education administrator for Doctor Glennon Mac   Chief Complaint: right knee pain    HPI:    11/06/22 Patient is a 71 year old female complaining  of right knee pain. Patient states that she missed stepped on the stairs and twisted it on Saturday night , swelling started "Sunday , has rested , feel a bulge on the medial side , aleve last night and that didn't do much , no numbness a little tingling, radiating pain up the leg, has pain when lifting the leg ,     12"$ /20/2023 Patient states    Relevant Historical Information: Hypothyroidism  Additional pertinent review of systems negative.   Current Outpatient Medications:    acyclovir (ZOVIRAX) 200 MG capsule, Take 1 capsule (200 mg total) by mouth 3 (three) times daily as needed., Disp: , Rfl:    Biotin 5 MG CAPS, Take by mouth., Disp: , Rfl:    Cholecalciferol (VITAMIN D3) 50 MCG (2000 UT) capsule, Take by mouth., Disp: , Rfl:    doxycycline (VIBRAMYCIN) 50 MG capsule, Take 1 capsule by mouth twice daily, Disp: 180 capsule, Rfl: 0   ibuprofen (ADVIL,MOTRIN) 200 MG tablet, Take 200 mg by mouth every 6 (six) hours as needed for moderate pain., Disp: , Rfl:    levothyroxine (SYNTHROID) 75 MCG tablet, TAKE 1 TABLET BY MOUTH  DAILY BEFORE BREAKFAST, Disp: 90 tablet, Rfl: 3   meloxicam (MOBIC) 15 MG tablet, Take 1 tablet (15 mg total) by mouth daily., Disp: 30 tablet, Rfl: 0   mirtazapine (REMERON) 7.5 MG tablet, Take 1 tablet (7.5 mg total) by mouth at bedtime., Disp: 90 tablet, Rfl: 3   Multiple Vitamins-Minerals (OCUVITE ADULT 50+ PO), Take by mouth., Disp: , Rfl:    Omega-3 Fatty Acids (FISH OIL) 1000 MG CAPS, Take by  mouth., Disp: , Rfl:    propranolol (INDERAL) 10 MG tablet, Take 1 tablet (10 mg total) by mouth 3 (three) times daily as needed., Disp: 270 tablet, Rfl: 3   simvastatin (ZOCOR) 10 MG tablet, Take 10 mg by mouth daily., Disp: , Rfl:    solifenacin (VESICARE) 10 MG tablet, Take by mouth daily., Disp: , Rfl:    White Petrolatum-Mineral Oil (GENTEAL TEARS NIGHT-TIME) OINT, Apply to eye., Disp: , Rfl:    Objective:     There were no vitals filed for this visit.    There is no height or weight on file to calculate BMI.    Physical Exam:    ***   Electronically signed by:  [name] D.[name] 7:40 AM 11/28/22

## 2022-11-29 ENCOUNTER — Ambulatory Visit (INDEPENDENT_AMBULATORY_CARE_PROVIDER_SITE_OTHER): Payer: Medicare Other | Admitting: Sports Medicine

## 2022-11-29 ENCOUNTER — Other Ambulatory Visit: Payer: Self-pay | Admitting: Family Medicine

## 2022-11-29 VITALS — BP 120/68 | HR 74 | Ht 65.0 in | Wt 154.0 lb

## 2022-11-29 DIAGNOSIS — M1711 Unilateral primary osteoarthritis, right knee: Secondary | ICD-10-CM | POA: Diagnosis not present

## 2022-11-29 DIAGNOSIS — E039 Hypothyroidism, unspecified: Secondary | ICD-10-CM

## 2022-11-29 DIAGNOSIS — E785 Hyperlipidemia, unspecified: Secondary | ICD-10-CM

## 2022-11-29 DIAGNOSIS — M25561 Pain in right knee: Secondary | ICD-10-CM | POA: Diagnosis not present

## 2022-11-29 MED ORDER — MELOXICAM 15 MG PO TABS: 15.0000 mg | ORAL_TABLET | Freq: Every day | ORAL | 0 refills | Status: DC

## 2022-11-29 NOTE — Patient Instructions (Addendum)
Good to see you Meloxicam 15 mg as needed no more than 1-2 times per week  Tylenol [phone] mg 2-3 times a day for pain relief  As needed follow up

## 2022-11-30 ENCOUNTER — Other Ambulatory Visit (INDEPENDENT_AMBULATORY_CARE_PROVIDER_SITE_OTHER): Payer: Medicare Other

## 2022-11-30 DIAGNOSIS — E039 Hypothyroidism, unspecified: Secondary | ICD-10-CM

## 2022-11-30 DIAGNOSIS — E785 Hyperlipidemia, unspecified: Secondary | ICD-10-CM

## 2022-11-30 LAB — COMPREHENSIVE METABOLIC PANEL
ALT: 34 U/L (ref 0–35)
AST: 30 U/L (ref 0–37)
Albumin: 4.2 g/dL (ref 3.5–5.2)
Alkaline Phosphatase: 78 U/L (ref 39–117)
BUN: 19 mg/dL (ref 6–23)
CO2: 27 mEq/L (ref 19–32)
Calcium: 9.3 mg/dL (ref 8.4–10.5)
Chloride: 107 mEq/L (ref 96–112)
Creatinine, Ser: 0.75 mg/dL (ref 0.40–1.20)
GFR: 81.29 mL/min (ref >60.00)
Glucose, Bld: 86 mg/dL (ref 70–99)
Potassium: 4.2 mEq/L (ref 3.5–5.1)
Sodium: 142 mEq/L (ref 135–145)
Total Bilirubin: 0.4 mg/dL (ref 0.2–1.2)
Total Protein: 6.1 g/dL (ref 6.0–8.3)

## 2022-11-30 LAB — CBC WITH DIFFERENTIAL/PLATELET
Basophils Absolute: 0 10*3/uL (ref 0.0–0.1)
Basophils Relative: 0.5 % (ref 0.0–3.0)
Eosinophils Absolute: 0.1 10*3/uL (ref 0.0–0.7)
Eosinophils Relative: 1.4 % (ref 0.0–5.0)
HCT: 44.3 % (ref 36.0–46.0)
Hemoglobin: 14.6 g/dL (ref 12.0–15.0)
Lymphocytes Relative: 28.7 % (ref 12.0–46.0)
Lymphs Abs: 1.6 10*3/uL (ref 0.7–4.0)
MCHC: 33 g/dL (ref 30.0–36.0)
MCV: 92.9 fl (ref 78.0–100.0)
Monocytes Absolute: 0.5 10*3/uL (ref 0.1–1.0)
Monocytes Relative: 9.5 % (ref 3.0–12.0)
Neutro Abs: 3.3 10*3/uL (ref 1.4–7.7)
Neutrophils Relative %: 59.9 % (ref 43.0–77.0)
Platelets: 143 10*3/uL — ABNORMAL LOW (ref 150.0–400.0)
RBC: 4.76 Mil/uL (ref 3.87–5.11)
RDW: 13.8 % (ref 11.5–15.5)
WBC: 5.5 10*3/uL (ref 4.0–10.5)

## 2022-11-30 LAB — LIPID PANEL
Cholesterol: 151 mg/dL (ref 0–200)
HDL: 68.2 mg/dL (ref >39.00)
LDL Cholesterol: 66 mg/dL (ref 0–99)
NonHDL: 82.72
Total CHOL/HDL Ratio: 2
Triglycerides: 83 mg/dL (ref 0.0–149.0)
VLDL: 16.6 mg/dL (ref 0.0–40.0)

## 2022-11-30 LAB — TSH: TSH: 2.68 u[IU]/mL (ref 0.35–5.50)

## 2022-12-15 ENCOUNTER — Encounter: Payer: Self-pay | Admitting: Family Medicine

## 2022-12-15 ENCOUNTER — Ambulatory Visit (INDEPENDENT_AMBULATORY_CARE_PROVIDER_SITE_OTHER): Payer: Medicare Other | Admitting: Family Medicine

## 2022-12-15 VITALS — BP 108/68 | HR 66 | Temp 98.0°F | Ht 65.0 in | Wt 149.0 lb

## 2022-12-15 DIAGNOSIS — E039 Hypothyroidism, unspecified: Secondary | ICD-10-CM | POA: Diagnosis not present

## 2022-12-15 DIAGNOSIS — N3281 Overactive bladder: Secondary | ICD-10-CM

## 2022-12-15 DIAGNOSIS — Z0000 Encounter for general adult medical examination without abnormal findings: Secondary | ICD-10-CM | POA: Diagnosis not present

## 2022-12-15 DIAGNOSIS — E785 Hyperlipidemia, unspecified: Secondary | ICD-10-CM | POA: Diagnosis not present

## 2022-12-15 DIAGNOSIS — G4700 Insomnia, unspecified: Secondary | ICD-10-CM | POA: Diagnosis not present

## 2022-12-15 DIAGNOSIS — Z7189 Other specified counseling: Secondary | ICD-10-CM

## 2022-12-15 MED ORDER — SOLIFENACIN SUCCINATE 10 MG PO TABS: 10.0000 mg | ORAL_TABLET | Freq: Every day | ORAL | 3 refills | Status: DC

## 2022-12-15 MED ORDER — SIMVASTATIN 10 MG PO TABS: 10.0000 mg | ORAL_TABLET | Freq: Every day | ORAL | 3 refills | Status: DC

## 2022-12-15 MED ORDER — SIMVASTATIN 10 MG PO TABS: 10.0000 mg | ORAL_TABLET | Freq: Every day | ORAL | Status: DC

## 2022-12-15 MED ORDER — DOXYCYCLINE HYCLATE 50 MG PO CAPS: 50.0000 mg | ORAL_CAPSULE | Freq: Two times a day (BID) | ORAL | 3 refills | Status: DC

## 2022-12-15 MED ORDER — LEVOTHYROXINE SODIUM 75 MCG PO TABS: 75.0000 ug | ORAL_TABLET | Freq: Every day | ORAL | 3 refills | Status: DC

## 2022-12-15 NOTE — Progress Notes (Signed)
I have personally reviewed the Medicare Annual Wellness questionnaire and have noted 1. The patient's medical and social history 2. Their use of alcohol, tobacco or illicit drugs 3. Their current medications and supplements 4. The patient's functional ability including ADL's, fall risks, home safety risks and hearing or visual             impairment. 5. Diet and physical activities 6. Evidence for depression or mood disorders  The patients weight, height, BMI have been recorded in the chart and visual acuity is per eye clinic.  I have made referrals, counseling and provided education to the patient based review of the above and I have provided the pt with a written personalized care plan for preventive services.  Provider list updated- see scanned forms.  Routine anticipatory guidance given to patient.  See health maintenance. The possibility exists that previously documented standard health maintenance information may have been brought forward from a previous encounter into this note.  If needed, that same information has been updated to reflect the current situation based on today's encounter.    Flu Shingles PNA Tetanus Colon  Breast cancer screening Prostate cancer screening Advance directive Cognitive function addressed- see scanned forms- and if abnormal then additional documentation follows.   In addition to [facility] Wellness, follow up visit for the below conditions:  Meloxicam helped knee pain and she was able to get off med.    Remeron used for sleep.  Sleeping better with use.  No ADE on med.    Hypothyroid.  On replacement.  No ADE on med.  No neck mass, no dysphagia.    Vesicare helped with extra 1/2 tab if she has sig travel.    Elevated Cholesterol: Using medications without problems: yes Muscle aches: minimal occ cramping.   Diet compliance: yes Exercise: yes She is exercising w/o chest pain.    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  She has  been going to book club discussions.    PMH and SH reviewed  Meds, vitals, and allergies reviewed.   ROS: Per HPI.  Unless specifically indicated otherwise in HPI, the patient denies:  General: fever. Eyes: acute vision changes ENT: sore throat Cardiovascular: chest pain Respiratory: SOB GI: vomiting GU: dysuria Musculoskeletal: acute back pain Derm: acute rash Neuro: acute motor dysfunction Psych: worsening mood Endocrine: polydipsia Heme: bleeding Allergy: hayfever  GEN: nad, alert and oriented HEENT: mucous membranes moist NECK: supple w/o LA CV: rrr. PULM: ctab, no inc wob ABD: soft, +bs EXT: no edema SKIN: no acute rash

## 2022-12-15 NOTE — Patient Instructions (Signed)
Take care.  Glad to see you. Update me as needed.  Thanks for your effort.

## 2022-12-17 DIAGNOSIS — G4700 Insomnia, unspecified: Secondary | ICD-10-CM | POA: Insufficient documentation

## 2022-12-17 NOTE — Assessment & Plan Note (Signed)
  Advance directive-husband designated patient incapacitated.

## 2022-12-17 NOTE — Assessment & Plan Note (Signed)
Continue Vesicare as is.

## 2022-12-17 NOTE — Assessment & Plan Note (Signed)
Continue levothyroxine. On replacement.  No ADE on med.  No neck mass, no dysphagia.

## 2022-12-17 NOTE — Assessment & Plan Note (Signed)
Remeron used for sleep.  Sleeping better with use.  No ADE on med.   Continue Remeron.

## 2022-12-17 NOTE — Assessment & Plan Note (Signed)
Continue simvastatin.  Continue work on diet and exercise.

## 2022-12-17 NOTE — Assessment & Plan Note (Signed)
Flu 2023 Shingles 2022 PNA previously done Tetanus 2017 RSV previously done  COVID-vaccine previously done Colonoscopy 2017 Breast cancer screening 2023 Bone density test 2023 Advance directive-husband designated patient incapacitated. Cognitive function addressed- see scanned forms- and if abnormal then additional documentation follows.

## 2022-12-20 ENCOUNTER — Other Ambulatory Visit: Payer: Self-pay | Admitting: Family Medicine

## 2022-12-20 DIAGNOSIS — Z1231 Encounter for screening mammogram for malignant neoplasm of breast: Secondary | ICD-10-CM

## 2022-12-23 ENCOUNTER — Encounter: Payer: Self-pay | Admitting: Family Medicine

## 2022-12-28 ENCOUNTER — Other Ambulatory Visit: Payer: Self-pay | Admitting: Family Medicine

## 2022-12-28 MED ORDER — SIMVASTATIN 20 MG PO TABS: 20.0000 mg | ORAL_TABLET | Freq: Every day | ORAL | 3 refills | Status: DC

## 2023-01-02 ENCOUNTER — Ambulatory Visit (INDEPENDENT_AMBULATORY_CARE_PROVIDER_SITE_OTHER): Payer: Self-pay | Admitting: Dermatology

## 2023-01-02 DIAGNOSIS — L988 Other specified disorders of the skin and subcutaneous tissue: Secondary | ICD-10-CM

## 2023-01-02 NOTE — Progress Notes (Signed)
   Follow-Up Visit   Subjective  [name] is a 72 y.o. female who presents for the following: Facial Elastosis (Face, pt presents for botox).  The following portions of the chart were reviewed this encounter and updated as appropriate:   Tobacco  Allergies  Meds  Problems  Med Hx  Surg Hx  Fam Hx     Review of Systems:  No other skin or systemic complaints except as noted in HPI or Assessment and Plan.  Objective  Well appearing patient in no apparent distress; mood and affect are within normal limits.  A focused examination was performed including face. Relevant physical exam findings are noted in the Assessment and Plan.  face Rhytides and volume loss.       Assessment & Plan  Elastosis of skin face  Botox 20 units injected today to: - Frown complex 20 units  Botox Injection - face Location: frown complex  Informed consent: Discussed risks (infection, pain, bleeding, bruising, swelling, allergic reaction, paralysis of nearby muscles, eyelid droop, double vision, neck weakness, difficulty breathing, headache, undesirable cosmetic result, and need for additional treatment) and benefits of the procedure, as well as the alternatives.  Informed consent was obtained.  Preparation: The area was cleansed with alcohol.  Procedure Details:  Botox was injected into the dermis with a 30-gauge needle. Pressure applied to any bleeding. Ice packs offered for swelling.  Lot Number:  [id number] Expiration:  03/26  Total Units Injected:  20  Plan: Patient was instructed to remain upright for 4 hours. Patient was instructed to avoid massaging the face and avoid vigorous exercise for the rest of the day. Tylenol may be used for headache.  Allow 2 weeks before returning to clinic for additional dosing as needed. Patient will call for any problems.    Return for 3-25mBotox.  I, SOthelia Pulling RMA, am acting as scribe for DSarina Ser MD . Documentation: I have reviewed the  above documentation for accuracy and completeness, and I agree with the above.  DSarina Ser MD

## 2023-01-02 NOTE — Patient Instructions (Signed)
Due to recent changes in healthcare laws, you may see results of your pathology and/or laboratory studies on MyChart before the doctors have had a chance to review them. We understand that in some cases there may be results that are confusing or concerning to you. Please understand that not all results are received at the same time and often the doctors may need to interpret multiple results in order to provide you with the best plan of care or course of treatment. Therefore, we ask that you please give us 2 business days to thoroughly review all your results before contacting the office for clarification. Should we see a critical lab result, you will be contacted sooner.   If You Need Anything After Your Visit  If you have any questions or concerns for your doctor, please call our main line at [phone] and press option 4 to reach your doctor's medical assistant. If no one answers, please leave a voicemail as directed and we will return your call as soon as possible. Messages left after 4 pm will be answered the following business day.   You may also send us a message via MyChart. We typically respond to MyChart messages within 1-2 business days.  For prescription refills, please ask your pharmacy to contact our office. Our fax number is [phone].  If you have an urgent issue when the clinic is closed that cannot wait until the next business day, you can page your doctor at the number below.    Please note that while we do our best to be available for urgent issues outside of office hours, we are not available 24/7.   If you have an urgent issue and are unable to reach us, you may choose to seek medical care at your doctor's office, retail clinic, urgent care center, or emergency room.  If you have a medical emergency, please immediately call 911 or go to the emergency department.  Pager Numbers  - Dr. Kowalski: [phone]  - Dr. Moye: [phone]  - Dr. Stewart:  [phone]  In the event of inclement weather, please call our main line at [phone] for an update on the status of any delays or closures.  Dermatology Medication Tips: Please keep the boxes that topical medications come in in order to help keep track of the instructions about where and how to use these. Pharmacies typically print the medication instructions only on the boxes and not directly on the medication tubes.   If your medication is too expensive, please contact our office at [phone] option 4 or send us a message through MyChart.   We are unable to tell what your co-pay for medications will be in advance as this is different depending on your insurance coverage. However, we may be able to find a substitute medication at lower cost or fill out paperwork to get insurance to cover a needed medication.   If a prior authorization is required to get your medication covered by your insurance company, please allow us 1-2 business days to complete this process.  Drug prices often vary depending on where the prescription is filled and some pharmacies may offer cheaper prices.  The website www.goodrx.com contains coupons for medications through different pharmacies. The prices here do not account for what the cost may be with help from insurance (it may be cheaper with your insurance), but the website can give you the price if you did not use any insurance.  - You can print the associated coupon and take it with   your prescription to the pharmacy.  - You may also stop by our office during regular business hours and pick up a GoodRx coupon card.  - If you need your prescription sent electronically to a different pharmacy, notify our office through [facility] MyChart or by phone at [phone] option 4.     Si Usted Necesita Algo Despus de Su Visita  Tambin puede enviarnos un mensaje a travs de MyChart. Por lo general respondemos a los mensajes de MyChart en el transcurso de 1 a 2  das hbiles.  Para renovar recetas, por favor pida a su farmacia que se ponga en contacto con nuestra oficina. Nuestro nmero de fax es el [phone].  Si tiene un asunto urgente cuando la clnica est cerrada y que no puede esperar hasta el siguiente da hbil, puede llamar/localizar a su doctor(a) al nmero que aparece a continuacin.   Por favor, tenga en cuenta que aunque hacemos todo lo posible para estar disponibles para asuntos urgentes fuera del horario de oficina, no estamos disponibles las 24 horas del da, los 7 das de la semana.   Si tiene un problema urgente y no puede comunicarse con nosotros, puede optar por buscar atencin mdica  en el consultorio de su doctor(a), en una clnica privada, en un centro de atencin urgente o en una sala de emergencias.  Si tiene una emergencia mdica, por favor llame inmediatamente al 911 o vaya a la sala de emergencias.  Nmeros de bper  - Dr. Kowalski: [phone]  - Dra. Moye: [phone]  - Dra. Stewart: [phone]  En caso de inclemencias del tiempo, por favor llame a nuestra lnea principal al [phone] para una actualizacin sobre el estado de cualquier retraso o cierre.  Consejos para la medicacin en dermatologa: Por favor, guarde las cajas en las que vienen los medicamentos de uso tpico para ayudarle a seguir las instrucciones sobre dnde y cmo usarlos. Las farmacias generalmente imprimen las instrucciones del medicamento slo en las cajas y no directamente en los tubos del medicamento.   Si su medicamento es muy caro, por favor, pngase en contacto con nuestra oficina llamando al [phone] y presione la opcin 4 o envenos un mensaje a travs de MyChart.   No podemos decirle cul ser su copago por los medicamentos por adelantado ya que esto es diferente dependiendo de la cobertura de su seguro. Sin embargo, es posible que podamos encontrar un medicamento sustituto a menor costo o llenar un formulario para que el  seguro cubra el medicamento que se considera necesario.   Si se requiere una autorizacin previa para que su compaa de seguros cubra su medicamento, por favor permtanos de 1 a 2 das hbiles para completar este proceso.  Los precios de los medicamentos varan con frecuencia dependiendo del lugar de dnde se surte la receta y alguna farmacias pueden ofrecer precios ms baratos.  El sitio web www.goodrx.com tiene cupones para medicamentos de diferentes farmacias. Los precios aqu no tienen en cuenta lo que podra costar con la ayuda del seguro (puede ser ms barato con su seguro), pero el sitio web puede darle el precio si no utiliz ningn seguro.  - Puede imprimir el cupn correspondiente y llevarlo con su receta a la farmacia.  - Tambin puede pasar por nuestra oficina durante el horario de atencin regular y recoger una tarjeta de cupones de GoodRx.  - Si necesita que su receta se enve electrnicamente a una farmacia diferente, informe a nuestra oficina a travs de MyChart de [place]   o por telfono llamando al [phone] y presione la opcin 4.  

## 2023-01-10 ENCOUNTER — Encounter: Payer: Self-pay | Admitting: Dermatology

## 2023-01-12 ENCOUNTER — Ambulatory Visit
Admission: RE | Admit: 2023-01-12 | Discharge: 2023-01-12 | Disposition: A | Discharge status: Home / Self Care | Payer: Medicare Other | Source: Ambulatory Visit | Attending: Family Medicine | Admitting: Family Medicine

## 2023-01-12 DIAGNOSIS — Z1231 Encounter for screening mammogram for malignant neoplasm of breast: Secondary | ICD-10-CM

## 2023-01-17 ENCOUNTER — Other Ambulatory Visit: Payer: Self-pay | Admitting: Family Medicine

## 2023-01-17 DIAGNOSIS — R928 Other abnormal and inconclusive findings on diagnostic imaging of breast: Secondary | ICD-10-CM

## 2023-01-17 DIAGNOSIS — N63 Unspecified lump in unspecified breast: Secondary | ICD-10-CM

## 2023-01-19 ENCOUNTER — Ambulatory Visit
Admission: RE | Admit: 2023-01-19 | Discharge: 2023-01-19 | Disposition: A | Discharge status: Home / Self Care | Payer: Medicare Other | Source: Ambulatory Visit | Attending: Family Medicine | Admitting: Family Medicine

## 2023-01-19 DIAGNOSIS — N63 Unspecified lump in unspecified breast: Secondary | ICD-10-CM

## 2023-01-19 DIAGNOSIS — R928 Other abnormal and inconclusive findings on diagnostic imaging of breast: Secondary | ICD-10-CM

## 2023-01-22 ENCOUNTER — Other Ambulatory Visit: Payer: Self-pay | Admitting: Family Medicine

## 2023-01-22 DIAGNOSIS — R928 Other abnormal and inconclusive findings on diagnostic imaging of breast: Secondary | ICD-10-CM

## 2023-01-22 DIAGNOSIS — N63 Unspecified lump in unspecified breast: Secondary | ICD-10-CM

## 2023-01-24 ENCOUNTER — Ambulatory Visit
Admission: RE | Admit: 2023-01-24 | Discharge: 2023-01-24 | Disposition: A | Discharge status: Home / Self Care | Payer: Medicare Other | Source: Ambulatory Visit | Attending: Family Medicine | Admitting: Family Medicine

## 2023-01-24 DIAGNOSIS — R928 Other abnormal and inconclusive findings on diagnostic imaging of breast: Secondary | ICD-10-CM | POA: Insufficient documentation

## 2023-01-24 DIAGNOSIS — N63 Unspecified lump in unspecified breast: Secondary | ICD-10-CM | POA: Diagnosis present

## 2023-01-24 HISTORY — PX: BREAST BIOPSY: SHX20

## 2023-01-24 MED ORDER — LIDOCAINE HCL (PF) 1 % IJ SOLN
5.0000 mL | Freq: Once | INTRAMUSCULAR | Status: AC
  Administered 2023-01-24: 5 mL via INTRADERMAL

## 2023-01-24 MED ORDER — LIDOCAINE-EPINEPHRINE 1 %-1:100000 IJ SOLN
3.0000 mL | Freq: Once | INTRAMUSCULAR | Status: AC
  Administered 2023-01-24: 3 mL via INTRADERMAL

## 2023-01-25 ENCOUNTER — Encounter: Payer: Self-pay | Admitting: *Deleted

## 2023-01-25 LAB — SURGICAL PATHOLOGY

## 2023-01-25 NOTE — Progress Notes (Signed)
Referral recieved from [facility] for benign breast mass.  Referral sent to [facility] surgery for Dr. Donne Hazel per patient preference.

## 2023-02-01 ENCOUNTER — Telehealth: Payer: Self-pay | Admitting: Diagnostic Radiology

## 2023-02-08 ENCOUNTER — Encounter: Payer: Self-pay | Admitting: Family Medicine

## 2023-02-08 ENCOUNTER — Ambulatory Visit (INDEPENDENT_AMBULATORY_CARE_PROVIDER_SITE_OTHER): Payer: Medicare Other | Admitting: Family Medicine

## 2023-02-08 VITALS — BP 110/52 | HR 67 | Temp 98.1°F | Ht 65.0 in | Wt 152.0 lb

## 2023-02-08 DIAGNOSIS — L03319 Cellulitis of trunk, unspecified: Secondary | ICD-10-CM

## 2023-02-08 MED ORDER — AMOXICILLIN-POT CLAVULANATE 875-125 MG PO TABS: 1.0000 | ORAL_TABLET | Freq: Two times a day (BID) | ORAL | Status: DC

## 2023-02-08 MED ORDER — DOXYCYCLINE HYCLATE 100 MG PO TABS: 100.0000 mg | ORAL_TABLET | Freq: Two times a day (BID) | ORAL | Status: DC

## 2023-02-08 NOTE — Patient Instructions (Signed)
Finish doxy/augmentin then change to baseline doxy.  Update me as needed.   The cough should improve gradually.  Take care.  Glad to see you.

## 2023-02-08 NOTE — Progress Notes (Signed)
Per Ingrit Kolepja's H&P "Patient is a 72 year old female with past medical history of hypothyroidism who presented to the ED with complaint of left breast cellulitis. She had a breast biopsy 01/24/23. Over the last 2 days she noticed left breast was red and swollen. She was started on keflex for presumed cellulitis and has had 5 dose already. Over the last 24 hours redness has gotten significantly worse with it extending into her ribcage and axilla. She reports fever and chills, highest fever 101.7. Mild nausea. Patient has received a full laboratory work-up, as noted below, benign. CT scan showing no acute findings, no subcutaneous fluid collections and no lymphadenopathy. We have been called to admit the patient for further treatment and evaluation. Plan of care discussed with patient and husband at [facility] Course:  Cellulitis left breast, improving - left breast, started after breast biopsy, filed outpatient treatment with Keflex, significantly worsening erythema over the 24 hours prior to admission - Outline marked with pen - blood cultures NGTD - Started on empiric Vancomycin and Cefepime - MRSA PCR negative, but continued Vanc due to non-response to Keflex initially - Transitioned to oral Doxycycline and Augmentin at discharge - CT with no evidence of fluid collection or lymphadenopathy - Erythema and pain much improved prior to discharge    Result Date: 01/30/2023 CT SCAN OF THE CHEST WITHOUT IV CONTRAST: COMPARISON: No comparison TECHNIQUE: Utilizing automatic exposure control, helically acquired images were obtained from the lung apices through the adrenal glands without IV contrast administration. FINDINGS: No supraclavicular lymphadenopathy. No axillary lymphadenopathy. Normal caliber thoracic aorta and main pulmonary artery. Calcified subcarinal lymph nodes. No mediastinal lymphadenopathy. Trace pericardial effusion. No pleural effusion. Calcified granulomas within the liver and  spleen. No acute findings in the upper abdomen. No pneumothorax. Central airways are patent. Mild dependent atelectasis. No lung consolidation. No lung nodules. No acute bony findings. No subcutaneous fluid collection.   IMPRESSION: NO ACUTE PULMONARY PROCESS.   ===================================== Inpatient course and recent events discussed with patient.  See above.  Admitted with cellulitis requiring IV antibiotics after failed outpatient treatment. She is better but not back to baseline.  D/w pt about inpatient course.  Still on doxy and augmentin.  She'll change to '50mg'$  doxy per baseline when done with current course.  She has surgery clinic f/u pending.  No fevers now. No fevers in the last 8 days. Skin is clearly improving.  She has post inflammatory changes on the left chest wall.  See exam.  She had cough prior to recent infection.  Cough is drier now, prev was more productive.  6 weeks duration.  She had prev kept [place] [facility] [place] [facility].    Meds, vitals, and allergies reviewed.   ROS: Per HPI unless specifically indicated in ROS section   GEN: nad, alert and oriented HEENT: ncat NECK: supple w/o LA CV: rrr.  PULM: ctab, no inc wob ABD: soft, +bs EXT: no edema SKIN: No acute erythema. Chaperoned exam with post inflammatory changes on the L chest wall, lateral to L breast.   30 minutes were devoted to patient care in this encounter (this includes time spent reviewing the patient's file/history, interviewing and examining the patient, counseling/reviewing plan with patient).

## 2023-02-11 DIAGNOSIS — L0390 Cellulitis, unspecified: Secondary | ICD-10-CM | POA: Insufficient documentation

## 2023-02-11 NOTE — Assessment & Plan Note (Signed)
Clearly better in the meantime. Finish doxy/augmentin then change to baseline doxy.  Update me as needed.   The cough should improve gradually.  I suspect she had a postinfectious cough that should gradually improve.

## 2023-02-12 ENCOUNTER — Other Ambulatory Visit: Payer: Self-pay | Admitting: General Surgery

## 2023-02-12 DIAGNOSIS — N6092 Unspecified benign mammary dysplasia of left breast: Secondary | ICD-10-CM

## 2023-02-12 MED ORDER — DIPHENHYDRAMINE HCL 50 MG/ML IJ SOLN: 25.0000 mg | Freq: Once | INTRAMUSCULAR | Status: DC

## 2023-02-14 ENCOUNTER — Other Ambulatory Visit: Payer: Self-pay | Admitting: General Surgery

## 2023-02-14 ENCOUNTER — Telehealth: Payer: Self-pay | Admitting: Genetic Counselor

## 2023-02-14 DIAGNOSIS — N6092 Unspecified benign mammary dysplasia of left breast: Secondary | ICD-10-CM

## 2023-02-14 NOTE — Telephone Encounter (Signed)
Scheduled appt per 3/5 referral. Pt is aware of appt date and time. Pt is aware to arrive 15 mins prior to appt time and to bring and updated insurance card. Pt is aware of appt location.

## 2023-02-16 ENCOUNTER — Other Ambulatory Visit: Payer: Self-pay | Admitting: Family Medicine

## 2023-02-16 MED ORDER — ACYCLOVIR 200 MG PO CAPS: 200.0000 mg | ORAL_CAPSULE | Freq: Three times a day (TID) | ORAL | 1 refills | Status: DC | PRN

## 2023-02-20 NOTE — Telephone Encounter (Signed)
The patient reported itching and redness in the inferior and upper outer left breast, but not at the biopsy site.  [place]  I instructed her to try 25-50 mg Bendadryl po and called in a prescription for 100 mg doxycyline po bid x 10 days [place]  This didn't occur until 3 days after the bx.  Several days later I called to check on her and she reported that the redness became much worse involving more of the breast and [place] getting ready to be discharged after improvement with additional antibiotics.

## 2023-03-02 ENCOUNTER — Encounter (HOSPITAL_BASED_OUTPATIENT_CLINIC_OR_DEPARTMENT_OTHER): Payer: Self-pay | Admitting: General Surgery

## 2023-03-02 ENCOUNTER — Other Ambulatory Visit: Payer: Self-pay

## 2023-03-08 MED ORDER — ENSURE PRE-SURGERY PO LIQD: 296.0000 mL | Freq: Once | ORAL | Status: DC

## 2023-03-08 NOTE — Progress Notes (Signed)

## 2023-03-13 ENCOUNTER — Ambulatory Visit (HOSPITAL_BASED_OUTPATIENT_CLINIC_OR_DEPARTMENT_OTHER)
Admission: RE | Admit: 2023-03-13 | Discharge: 2023-03-13 | Disposition: A | Discharge status: Home / Self Care | Payer: Medicare Other | Attending: General Surgery | Admitting: General Surgery

## 2023-03-13 ENCOUNTER — Other Ambulatory Visit: Payer: Self-pay

## 2023-03-13 ENCOUNTER — Ambulatory Visit
Admission: RE | Admit: 2023-03-13 | Discharge: 2023-03-13 | Disposition: A | Discharge status: Home / Self Care | Payer: Medicare Other | Source: Ambulatory Visit | Attending: General Surgery | Admitting: General Surgery

## 2023-03-13 ENCOUNTER — Ambulatory Visit (HOSPITAL_BASED_OUTPATIENT_CLINIC_OR_DEPARTMENT_OTHER): Payer: Medicare Other | Admitting: Certified Registered"

## 2023-03-13 ENCOUNTER — Encounter (HOSPITAL_BASED_OUTPATIENT_CLINIC_OR_DEPARTMENT_OTHER)
Admission: RE | Disposition: A | Discharge status: Home / Self Care | Payer: Self-pay | Source: Home / Self Care | Attending: General Surgery

## 2023-03-13 DIAGNOSIS — E039 Hypothyroidism, unspecified: Secondary | ICD-10-CM | POA: Insufficient documentation

## 2023-03-13 DIAGNOSIS — N6092 Unspecified benign mammary dysplasia of left breast: Secondary | ICD-10-CM

## 2023-03-13 DIAGNOSIS — N641 Fat necrosis of breast: Secondary | ICD-10-CM | POA: Diagnosis not present

## 2023-03-13 DIAGNOSIS — N6320 Unspecified lump in the left breast, unspecified quadrant: Secondary | ICD-10-CM | POA: Diagnosis present

## 2023-03-13 DIAGNOSIS — Z8041 Family history of malignant neoplasm of ovary: Secondary | ICD-10-CM | POA: Diagnosis not present

## 2023-03-13 HISTORY — PX: RADIOACTIVE SEED GUIDED EXCISIONAL BREAST BIOPSY: SHX6490

## 2023-03-13 HISTORY — PX: BREAST BIOPSY: SHX20

## 2023-03-13 SURGERY — RADIOACTIVE SEED GUIDED BREAST BIOPSY
Anesthesia: General | Site: Breast | Laterality: Left

## 2023-03-13 MED ORDER — PROPOFOL 10 MG/ML IV BOLUS
INTRAVENOUS | Status: DC | PRN
  Administered 2023-03-13: 200 mg via INTRAVENOUS

## 2023-03-13 MED ORDER — DEXAMETHASONE SODIUM PHOSPHATE 10 MG/ML IJ SOLN
INTRAMUSCULAR | Status: DC | PRN
  Administered 2023-03-13: 10 mg via INTRAVENOUS

## 2023-03-13 MED ORDER — MIDAZOLAM HCL 2 MG/2ML IJ SOLN
INTRAMUSCULAR | Status: AC
  Filled 2023-03-13: qty 2

## 2023-03-13 MED ORDER — VANCOMYCIN HCL IN DEXTROSE 1-5 GM/200ML-% IV SOLN
INTRAVENOUS | Status: AC
  Filled 2023-03-13: qty 200

## 2023-03-13 MED ORDER — LIDOCAINE HCL (CARDIAC) PF 100 MG/5ML IV SOSY
PREFILLED_SYRINGE | INTRAVENOUS | Status: DC | PRN
  Administered 2023-03-13: 60 mg via INTRAVENOUS

## 2023-03-13 MED ORDER — PROPOFOL 10 MG/ML IV BOLUS
INTRAVENOUS | Status: AC
  Filled 2023-03-13: qty 20

## 2023-03-13 MED ORDER — LACTATED RINGERS IV SOLN: INTRAVENOUS | Status: DC

## 2023-03-13 MED ORDER — ONDANSETRON HCL 4 MG/2ML IJ SOLN
INTRAMUSCULAR | Status: AC
  Filled 2023-03-13: qty 2

## 2023-03-13 MED ORDER — DIPHENHYDRAMINE HCL 50 MG/ML IJ SOLN
INTRAMUSCULAR | Status: AC
  Filled 2023-03-13: qty 1

## 2023-03-13 MED ORDER — FENTANYL CITRATE (PF) 100 MCG/2ML IJ SOLN: 25.0000 ug | INTRAMUSCULAR | Status: DC | PRN

## 2023-03-13 MED ORDER — MIDAZOLAM HCL 5 MG/5ML IJ SOLN
INTRAMUSCULAR | Status: DC | PRN
  Administered 2023-03-13: 2 mg via INTRAVENOUS

## 2023-03-13 MED ORDER — KETOROLAC TROMETHAMINE 15 MG/ML IJ SOLN: 15.0000 mg | Freq: Once | INTRAMUSCULAR | Status: DC | PRN

## 2023-03-13 MED ORDER — ACETAMINOPHEN 500 MG PO TABS
ORAL_TABLET | ORAL | Status: AC
  Filled 2023-03-13: qty 2

## 2023-03-13 MED ORDER — ACETAMINOPHEN 500 MG PO TABS
1000.0000 mg | ORAL_TABLET | ORAL | Status: AC
  Administered 2023-03-13: 1000 mg via ORAL

## 2023-03-13 MED ORDER — ONDANSETRON HCL 4 MG/2ML IJ SOLN: 4.0000 mg | Freq: Once | INTRAMUSCULAR | Status: DC | PRN

## 2023-03-13 MED ORDER — FENTANYL CITRATE (PF) 100 MCG/2ML IJ SOLN
INTRAMUSCULAR | Status: AC
  Filled 2023-03-13: qty 2

## 2023-03-13 MED ORDER — EPHEDRINE SULFATE (PRESSORS) 50 MG/ML IJ SOLN
INTRAMUSCULAR | Status: DC | PRN
  Administered 2023-03-13: 5 mg via INTRAVENOUS
  Administered 2023-03-13: 10 mg via INTRAVENOUS

## 2023-03-13 MED ORDER — LIDOCAINE 2% (20 MG/ML) 5 ML SYRINGE
INTRAMUSCULAR | Status: AC
  Filled 2023-03-13: qty 5

## 2023-03-13 MED ORDER — EPHEDRINE 5 MG/ML INJ
INTRAVENOUS | Status: AC
  Filled 2023-03-13: qty 5

## 2023-03-13 MED ORDER — FENTANYL CITRATE (PF) 100 MCG/2ML IJ SOLN
INTRAMUSCULAR | Status: DC | PRN
  Administered 2023-03-13: 25 ug via INTRAVENOUS

## 2023-03-13 MED ORDER — AMISULPRIDE (ANTIEMETIC) 5 MG/2ML IV SOLN: 10.0000 mg | Freq: Once | INTRAVENOUS | Status: DC | PRN

## 2023-03-13 MED ORDER — VANCOMYCIN HCL IN DEXTROSE 1-5 GM/200ML-% IV SOLN
1000.0000 mg | INTRAVENOUS | Status: AC
  Administered 2023-03-13: 1000 mg via INTRAVENOUS

## 2023-03-13 MED ORDER — DIPHENHYDRAMINE HCL 50 MG/ML IJ SOLN
INTRAMUSCULAR | Status: DC | PRN
  Administered 2023-03-13: 25 mg via INTRAVENOUS

## 2023-03-13 MED ORDER — BUPIVACAINE HCL (PF) 0.25 % IJ SOLN
INTRAMUSCULAR | Status: DC | PRN
  Administered 2023-03-13: 10 mL

## 2023-03-13 MED ORDER — DEXAMETHASONE SODIUM PHOSPHATE 10 MG/ML IJ SOLN
INTRAMUSCULAR | Status: AC
  Filled 2023-03-13: qty 1

## 2023-03-13 MED ORDER — ONDANSETRON HCL 4 MG/2ML IJ SOLN
INTRAMUSCULAR | Status: DC | PRN
  Administered 2023-03-13: 4 mg via INTRAVENOUS

## 2023-03-13 SURGICAL SUPPLY — 57 items
ADH SKN CLS APL DERMABOND .7 (GAUZE/BANDAGES/DRESSINGS) ×1
APL PRP STRL LF DISP 70% ISPRP (MISCELLANEOUS) ×1
APPLIER CLIP 9.375 MED OPEN (MISCELLANEOUS)
APR CLP MED 9.3 20 MLT OPN (MISCELLANEOUS)
BINDER BREAST LRG (GAUZE/BANDAGES/DRESSINGS) IMPLANT
BINDER BREAST MEDIUM (GAUZE/BANDAGES/DRESSINGS) IMPLANT
BINDER BREAST XLRG (GAUZE/BANDAGES/DRESSINGS) IMPLANT
BINDER BREAST XXLRG (GAUZE/BANDAGES/DRESSINGS) IMPLANT
BLADE SURG 15 STRL LF DISP TIS (BLADE) ×1 IMPLANT
BLADE SURG 15 STRL SS (BLADE) ×1
CANISTER SUC SOCK COL 7IN (MISCELLANEOUS) IMPLANT
CANISTER SUCT 1200ML W/VALVE (MISCELLANEOUS) IMPLANT
CHLORAPREP W/TINT 26 (MISCELLANEOUS) ×1 IMPLANT
CLIP APPLIE 9.375 MED OPEN (MISCELLANEOUS) IMPLANT
CLIP TI WIDE RED SMALL 6 (CLIP) IMPLANT
COVER BACK TABLE 60X90IN (DRAPES) ×1 IMPLANT
COVER MAYO STAND STRL (DRAPES) ×1 IMPLANT
COVER PROBE CYLINDRICAL 5X96 (MISCELLANEOUS) ×1 IMPLANT
DERMABOND ADVANCED .7 DNX12 (GAUZE/BANDAGES/DRESSINGS) ×1 IMPLANT
DRAPE LAPAROSCOPIC ABDOMINAL (DRAPES) ×1 IMPLANT
DRAPE UTILITY XL STRL (DRAPES) ×1 IMPLANT
DRSG TEGADERM 4X4.75 (GAUZE/BANDAGES/DRESSINGS) IMPLANT
ELECT COATED BLADE 2.86 ST (ELECTRODE) ×1 IMPLANT
ELECT REM PT RETURN 9FT ADLT (ELECTROSURGICAL) ×1
ELECTRODE REM PT RTRN 9FT ADLT (ELECTROSURGICAL) ×1 IMPLANT
GAUZE SPONGE 4X4 12PLY STRL LF (GAUZE/BANDAGES/DRESSINGS) IMPLANT
GLOVE BIO SURGEON STRL SZ7 (GLOVE) ×2 IMPLANT
GLOVE BIOGEL PI IND STRL 6.5 (GLOVE) IMPLANT
GLOVE BIOGEL PI IND STRL 7.0 (GLOVE) IMPLANT
GLOVE BIOGEL PI IND STRL 7.5 (GLOVE) ×1 IMPLANT
GLOVE ECLIPSE 6.5 STRL STRAW (GLOVE) IMPLANT
GOWN STRL REUS W/ TWL LRG LVL3 (GOWN DISPOSABLE) ×2 IMPLANT
GOWN STRL REUS W/TWL LRG LVL3 (GOWN DISPOSABLE) ×2
HEMOSTAT ARISTA ABSORB 3G PWDR (HEMOSTASIS) IMPLANT
KIT MARKER MARGIN INK (KITS) ×1 IMPLANT
NDL HYPO 25X1 1.5 SAFETY (NEEDLE) ×1 IMPLANT
NEEDLE HYPO 25X1 1.5 SAFETY (NEEDLE) ×1 IMPLANT
NS IRRIG 1000ML POUR BTL (IV SOLUTION) IMPLANT
PACK BASIN DAY SURGERY FS (CUSTOM PROCEDURE TRAY) ×1 IMPLANT
PENCIL SMOKE EVACUATOR (MISCELLANEOUS) ×1 IMPLANT
RETRACTOR ONETRAX LX 90X20 (MISCELLANEOUS) IMPLANT
SLEEVE SCD COMPRESS KNEE MED (STOCKING) ×1 IMPLANT
SPIKE FLUID TRANSFER (MISCELLANEOUS) IMPLANT
SPONGE T-LAP 4X18 ~~LOC~~+RFID (SPONGE) ×1 IMPLANT
STRIP CLOSURE SKIN 1/2X4 (GAUZE/BANDAGES/DRESSINGS) ×1 IMPLANT
SUT MNCRL AB 4-0 PS2 18 (SUTURE) IMPLANT
SUT MON AB 5-0 PS2 18 (SUTURE) IMPLANT
SUT SILK 2 0 SH (SUTURE) IMPLANT
SUT VIC AB 2-0 SH 27 (SUTURE) ×1
SUT VIC AB 2-0 SH 27XBRD (SUTURE) ×1 IMPLANT
SUT VIC AB 3-0 SH 27 (SUTURE) ×1
SUT VIC AB 3-0 SH 27X BRD (SUTURE) ×1 IMPLANT
SYR CONTROL 10ML LL (SYRINGE) ×1 IMPLANT
TOWEL GREEN STERILE FF (TOWEL DISPOSABLE) ×1 IMPLANT
TRAY FAXITRON CT DISP (TRAY / TRAY PROCEDURE) ×1 IMPLANT
TUBE CONNECTING 20X1/4 (TUBING) IMPLANT
YANKAUER SUCT BULB TIP NO VENT (SUCTIONS) IMPLANT

## 2023-03-13 NOTE — Op Note (Signed)
  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 Serita Grammes Anesthesia: General Estimated blood loss: Minimal Complication: None Drains: None Specimens: left breast tissue marked with paint containing seed and clip Sponge needle count was correct at completion Disposition recovery stable   Indications:71 yof with fm of ovarian cancer in her mom as well as a maternal grandmother.  She has a history of a breast reduction and a benign breast biopsy sounds like for fat necrosis previously. She more recently underwent a mammogram and ultrasound after she noted a palpable mass. She has B density breast tissue. She had a mass in the left breast. This underwent mammogram and ultrasound. On ultrasound this showed a 3 x 2 x 4 mm mass. This underwent a biopsy with a clip placement. 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 We discussed a seed guided excisional biopsy.   Procedure: After informed consent was obtained the patient was taken to the operating room.  She had the seed placed prior to beginning.  I had these mammograms available in the operating room.  She was given antibiotics.  SCDs were placed.  She was then placed under general anesthesia without complication.  She was prepped and draped in the standard sterile surgical fashion.  A surgical timeout was then performed.   I made an incision on the vertical limb where she had prior surgery after infiltrating marcaine.   I then used the neoprobe to dissect towards the seed.  I remove dthe seed and some of the surrounding tissue.  Mammogram confirmed removal of the seed and the clip.  I then obtained hemostasis.  I closed the breast tissue with 2-0 Vicryl.  The skin was closed with 3-0 Vicryl and 4-0  Monocryl.  Glue and Steri-Strips were applied.  She tolerated this well was extubated and transferred to recovery stable.

## 2023-03-13 NOTE — Anesthesia Preprocedure Evaluation (Signed)
Anesthesia Evaluation  Patient identified by MRN, date of birth, ID band  Reviewed: Allergy & Precautions, NPO status , Patient's Chart, lab work & pertinent test results  Airway Mallampati: III  TM Distance: >3 FB Neck ROM: Full    Dental no notable dental hx.    Pulmonary neg pulmonary ROS   Pulmonary exam normal        Cardiovascular negative cardio ROS Normal cardiovascular exam     Neuro/Psych  PSYCHIATRIC DISORDERS Anxiety     negative neurological ROS     GI/Hepatic negative GI ROS, Neg liver ROS,,,  Endo/Other  Hypothyroidism    Renal/GU negative Renal ROS     Musculoskeletal negative musculoskeletal ROS (+)    Abdominal   Peds  Hematology negative hematology ROS (+)   Anesthesia Other Findings LEFT BREAST MASS  Reproductive/Obstetrics                             Anesthesia Physical Anesthesia Plan  ASA: 2  Anesthesia Plan: General   Post-op Pain Management:    Induction: Intravenous  PONV Risk Score and Plan: 3 and Ondansetron, Dexamethasone, Midazolam and Treatment may vary due to age or medical condition  Airway Management Planned: LMA  Additional Equipment:   Intra-op Plan:   Post-operative Plan: Extubation in OR  Informed Consent: I have reviewed the patients History and Physical, chart, labs and discussed the procedure including the risks, benefits and alternatives for the proposed anesthesia with the patient or authorized representative who has indicated his/her understanding and acceptance.     Dental advisory given  Plan Discussed with: CRNA  Anesthesia Plan Comments:        Anesthesia Quick Evaluation

## 2023-03-13 NOTE — Interval H&P Note (Signed)
History and Physical Interval Note:  03/13/2023 12:22 PM  [name]  has presented today for surgery, with the diagnosis of LEFT BREAST MASS.  The various methods of treatment have been discussed with the patient and family. After consideration of risks, benefits and other options for treatment, the patient has consented to  Procedure(s): RADIOACTIVE SEED GUIDED EXCISIONAL LEFT BREAST BIOPSY (Left) as a surgical intervention.  The patient's history has been reviewed, patient examined, no change in status, stable for surgery.  I have reviewed the patient's chart and labs.  Questions were answered to the patient's satisfaction.     Rolm Bookbinder

## 2023-03-13 NOTE — Transfer of Care (Signed)
Immediate Anesthesia Transfer of Care Note  Patient: [name]  Procedure(s) Performed: RADIOACTIVE SEED GUIDED EXCISIONAL LEFT BREAST BIOPSY (Left: Breast)  Patient Location: PACU  Anesthesia Type:General  Level of Consciousness: drowsy  Airway & Oxygen Therapy: Patient Spontanous Breathing and Patient connected to face mask oxygen  Post-op Assessment: Report given to RN and Post -op Vital signs reviewed and stable  Post vital signs: Reviewed and stable  Last Vitals:  Vitals Value Taken Time  BP 120/62 03/13/23 1323  Temp    Pulse 65 03/13/23 1327  Resp 12 03/13/23 1327  SpO2 99 % 03/13/23 1327  Vitals shown include unvalidated device data.  Last Pain:  Vitals:   03/13/23 1124  TempSrc: Temporal  PainSc: 0-No pain         Complications: No notable events documented.

## 2023-03-13 NOTE — H&P (Signed)
80 yof with fm of ovarian cancer in her mom as well as a maternal grandmother. She had believes had BRCA testing in the past. She has a history of a breast reduction and a benign breast biopsy sounds like for fat necrosis previously. She more recently underwent a mammogram and ultrasound after she noted a palpable mass. She has B density breast tissue she had a mass in the left breast. This underwent mammogram and ultrasound. On ultrasound this showed a 3 x 2 x 4 mm mass. This underwent a biopsy with a clip placement. 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 She is referred for evaluation. A couple of days after the radiologic core biopsy she developed cellulitis. This was attempted to be treated with Keflex. This did not help. While she was at the [facility] she then developed significant cellulitis and required admission for a couple of days for IV antibiotics. She had blood culture she reports is negative. She was put on vancomycin and this was the only thing that resolved the infection. Review of Systems: A complete review of systems was obtained from the patient. I have reviewed this information and discussed as appropriate with the patient. See HPI as well for other ROS.  Review of Systems  All other systems reviewed and are negative.   Medical History: Past Medical History:  Diagnosis Date  Arthritis  COVID-19  Hyperlipidemia  Thyroid disease  Tremor   There is no problem list on file for this patient.  Past Surgical History:  Procedure Laterality Date  HYSTERECTOMY VAGINAL 1992  CORNEAL EYE SURGERY Bilateral 1980's  RK  POSTERIOR FUSION CERVICAL SPINE  c2 -c6 - 2010, 2015, 2018    Allergies  Allergen Reactions  Iodinated Contrast Media Itching  Metrizamide Itching  Codeine Nausea and Vomiting  Dye Itching   Current Outpatient Medications on File Prior to Visit  Medication Sig Dispense Refill   atorvastatin (LIPITOR) 20 MG tablet Take 20 mg by mouth once daily  doxycycline (VIBRAMYCIN) 50 MG capsule  ibuprofen (ADVIL,MOTRIN) 200 MG tablet Take by mouth.  levothyroxine (SYNTHROID) 75 MCG tablet TAKE 1 TABLET BY MOUTH ONCE DAILY ON AN EMPTY STOMACH WITH A GLASS OF WATER 30-60 MINUTES BEFORE BREAKFAST (Patient taking differently: 0.125 mcg once daily) 90 tablet 3  meloxicam (MOBIC) 15 MG tablet Take 15 mg by mouth once daily  mirtazapine (REMERON) 7.5 MG tablet Take 7.5 mg by mouth at bedtime  propranoloL (INDERAL) 10 MG tablet Take 1 tablet by mouth 3 (three) times daily as needed  simvastatin (ZOCOR) 20 MG tablet Take by mouth  solifenacin (VESICARE) 10 MG tablet    Family History  Problem Relation Age of Onset  Hyperlipidemia (Elevated cholesterol) Mother  Cancer Mother  Stroke Father  High blood pressure (Hypertension) Father  Hyperlipidemia (Elevated cholesterol) Father  Myocardial Infarction (Heart attack) Father  Diabetes Father  Coronary Artery Disease (Blocked arteries around heart) Father  Macular degeneration Father  Glaucoma Neg Hx    Social History   Tobacco Use  Smoking Status Never  Smokeless Tobacco Never  Marital status: Married  Tobacco Use  Smoking status: Never  Smokeless tobacco: Never  Substance and Sexual Activity  Alcohol use: Yes  Alcohol/week: 1.0 standard drink of alcohol  Types: 1 Glasses of wine per week  Drug use: Never  Social History Narrative  She lives in [place] with her spouse.   Objective:   Vitals:  02/12/23 0933 02/12/23  0935  BP: 137/82  Pulse: 73  Temp: 36.7 C (98 F)  SpO2: 98%  Weight: 69.4 kg (153 lb)  Height: 165.1 cm (5\' 5" )  PainSc: 0-No pain  PainLoc: Breast   Body mass index is 25.46 kg/m.  Physical Exam Vitals reviewed.  Constitutional:  Appearance: Normal appearance.  Chest:  Breasts: Right: No inverted nipple, mass or nipple discharge.  Left: No inverted nipple, mass or nipple  discharge.  Lymphadenopathy:  Upper Body:  Right upper body: No supraclavicular or axillary adenopathy.  Left upper body: No supraclavicular or axillary adenopathy.  Neurological:  Mental Status: She is alert.     Assessment and Plan:   Atypical ductal hyperplasia of left breast  Left breast radioactive seed guided excisional biopsy  We discussed recommendations for excisional biopsy given the ADH and possible DCIS. We discussed surgery, risks, and recovery. Will give her vancomycin and Benadryl due to the history of an infection possibly with MRSA after the breast biopsy.

## 2023-03-13 NOTE — Anesthesia Procedure Notes (Signed)
Procedure Name: LMA Insertion Date/Time: 03/13/2023 12:44 PM  Performed by: Lavonia Dana, CRNAPre-anesthesia Checklist: Patient identified, Emergency Drugs available, Suction available and Patient being monitored Patient Re-evaluated:Patient Re-evaluated prior to induction Oxygen Delivery Method: Circle system utilized Preoxygenation: Pre-oxygenation with 100% oxygen Induction Type: IV induction Ventilation: Mask ventilation without difficulty LMA: LMA inserted LMA Size: 4.0 Number of attempts: 1 Airway Equipment and Method: Bite block Placement Confirmation: positive ETCO2 Tube secured with: Tape Dental Injury: Teeth and Oropharynx as per pre-operative assessment

## 2023-03-13 NOTE — Discharge Instructions (Addendum)
[place] Office Phone Number [phone]  POST OP INSTRUCTIONS Take 400 mg of ibuprofen every 8 hours or 650 mg tylenol every 6 hours for next 72 hours then as needed. Use ice several times daily also.  A prescription for pain medication may be given to you upon discharge.  Take your pain medication as prescribed, if needed.  If narcotic pain medicine is not needed, then you may take acetaminophen (Tylenol), naprosyn (Alleve) or ibuprofen (Advil) as needed.No Tylenol until after 5:30pm today, if needed. Take your usually prescribed medications unless otherwise directed If you need a refill on your pain medication, please contact your pharmacy.  They will contact our office to request authorization.  Prescriptions will not be filled after 5pm or on week-ends. You should eat very light the first 24 hours after surgery, such as soup, crackers, pudding, etc.  Resume your normal diet the day after surgery. Most patients will experience some swelling and bruising in the breast.  Ice packs and a good support bra will help.  Wear the breast binder provided or a sports bra for 72 hours day and night.  After that wear a sports bra during the day until you return to the office. Swelling and bruising can take several days to resolve.  It is common to experience some constipation if taking pain medication after surgery.  Increasing fluid intake and taking a stool softener will usually help or prevent this problem from occurring.  A mild laxative (Milk of Magnesia or Miralax) should be taken according to package directions if there are no bowel movements after 48 hours. I used skin glue on the incision, you may shower in 24 hours.  The glue will flake off over the next 2-3 weeks.  Any sutures or staples will be removed at the office during your follow-up visit. ACTIVITIES:  You may resume regular daily activities (gradually increasing) beginning the next day.  Wearing a good support bra or sports  bra minimizes pain and swelling.  You may have sexual intercourse when it is comfortable. You may drive when you no longer are taking prescription pain medication, you can comfortably wear a seatbelt, and you can safely maneuver your car and apply brakes. RETURN TO WORK:  ______________________________________________________________________________________ Dennis Bast should see your doctor in the office for a follow-up appointment approximately two weeks after your surgery.  Your doctor's nurse will typically make your follow-up appointment when she calls you with your pathology report.  Expect your pathology report 3-4 business days after your surgery.  You may call to check if you do not hear from [place] after three days. OTHER INSTRUCTIONS: _______________________________________________________________________________________________ _____________________________________________________________________________________________________________________________________ _____________________________________________________________________________________________________________________________________ _____________________________________________________________________________________________________________________________________  WHEN TO CALL DR WAKEFIELD: Fever over 101.0 Nausea and/or vomiting. Extreme swelling or bruising. Continued bleeding from incision. Increased pain, redness, or drainage from the incision.  The clinic staff is available to answer your questions during regular business hours.  Please don't hesitate to call and ask to speak to one of the nurses for clinical concerns.  If you have a medical emergency, go to the nearest emergency room or call 911.  A surgeon from [facility] is always on call at the hospital.  For further questions, please visit [email or web address]  Post Anesthesia Home Care Instructions  Activity: Get plenty of rest for the remainder of  the day. A responsible individual must stay with you for 24 hours following the procedure.  For the next 24 hours, DO NOT: -Drive a car -Paediatric nurse -Drink alcoholic beverages -  Take any medication unless instructed by your physician -Make any legal decisions or sign important papers.  Meals: Start with liquid foods such as gelatin or soup. Progress to regular foods as tolerated. Avoid greasy, spicy, heavy foods. If nausea and/or vomiting occur, drink only clear liquids until the nausea and/or vomiting subsides. Call your physician if vomiting continues.  Special Instructions/Symptoms: Your throat may feel dry or sore from the anesthesia or the breathing tube placed in your throat during surgery. If this causes discomfort, gargle with warm salt water. The discomfort should disappear within 24 hours.  If you had a scopolamine patch placed behind your ear for the management of post- operative nausea and/or vomiting:  1. The medication in the patch is effective for 72 hours, after which it should be removed.  Wrap patch in a tissue and discard in the trash. Wash hands thoroughly with soap and water. 2. You may remove the patch earlier than 72 hours if you experience unpleasant side effects which may include dry mouth, dizziness or visual disturbances. 3. Avoid touching the patch. Wash your hands with soap and water after contact with the patch.

## 2023-03-13 NOTE — Anesthesia Postprocedure Evaluation (Signed)
Anesthesia Post Note  Patient: [name]  Procedure(s) Performed: RADIOACTIVE SEED GUIDED EXCISIONAL LEFT BREAST BIOPSY (Left: Breast)     Patient location during evaluation: PACU Anesthesia Type: General Level of consciousness: awake Pain management: pain level controlled Vital Signs Assessment: post-procedure vital signs reviewed and stable Respiratory status: spontaneous breathing, nonlabored ventilation and respiratory function stable Cardiovascular status: blood pressure returned to baseline and stable Postop Assessment: no apparent nausea or vomiting Anesthetic complications: no   No notable events documented.  Last Vitals:  Vitals:   03/13/23 1345 03/13/23 1501  BP: 122/76 (!) 119/44  Pulse: 64 77  Resp: 15 16  Temp:  (!) 36.2 C  SpO2: 96% 96%    Last Pain:  Vitals:   03/13/23 1353  TempSrc:   PainSc: 0-No pain                 Anish Vana P Angele Wiemann

## 2023-03-14 ENCOUNTER — Encounter (HOSPITAL_BASED_OUTPATIENT_CLINIC_OR_DEPARTMENT_OTHER): Payer: Self-pay | Admitting: General Surgery

## 2023-03-16 LAB — SURGICAL PATHOLOGY

## 2023-04-04 ENCOUNTER — Encounter: Payer: Self-pay | Admitting: Psychiatry

## 2023-04-04 ENCOUNTER — Telehealth (INDEPENDENT_AMBULATORY_CARE_PROVIDER_SITE_OTHER): Payer: Medicare Other | Admitting: Psychiatry

## 2023-04-04 DIAGNOSIS — F411 Generalized anxiety disorder: Secondary | ICD-10-CM

## 2023-04-04 DIAGNOSIS — F4312 Post-traumatic stress disorder, chronic: Secondary | ICD-10-CM | POA: Diagnosis not present

## 2023-04-04 NOTE — Progress Notes (Signed)
Virtual Visit via Video Note  I connected with [name] on 04/04/23 at  1:30 PM EDT by a video enabled telemedicine application and verified that I am speaking with the correct person using two identifiers.  Location Provider Location : [facility] Patient Location : Home  Participants: Patient , Provider   I discussed the limitations of evaluation and management by telemedicine and the availability of in person appointments. The patient expressed understanding and agreed to proceed.    I discussed the assessment and treatment plan with the patient. The patient was provided an opportunity to ask questions and all were answered. The patient agreed with the plan and demonstrated an understanding of the instructions.   The patient was advised to call back or seek an in-person evaluation if the symptoms worsen or if the condition fails to improve as anticipated.   BH MD OP Progress Note  04/06/2023 1:05 PM Lemya Greenwell  MRN:  [id number]  Chief Complaint:  Chief Complaint  Patient presents with   Follow-up   Medication Refill   Anxiety   Depression   HPI: [name] is a 72 year old Caucasian female, retired, married, lives in [place], has a history of PTSD, GAD was evaluated by telemedicine today.  Patient [place]  She reports although she did have some medical issues recently, needed lumpectomy of her breast, later on developed cellulitis which is currently resolved, currently she is doing well with regards to her mood.  Reports she has been talking to her therapist and that has helped a lot.  Patient reports she currently does not have any significant sadness, hopelessness.  Reports anxiety symptoms are manageable.  She continues to take mirtazapine for sleep which is beneficial.  She does have propranolol as needed which she uses for anxiety and that has been beneficial.  Denies side effects to medications.  Patient denies any  suicidality, homicidality or perceptual disturbances.  Patient denies any other concerns today.  Visit Diagnosis:    ICD-10-CM   1. GAD (generalized anxiety disorder)  F41.1     2. Chronic post-traumatic stress disorder (PTSD)  F43.12       Past Psychiatric History: I have reviewed past psychiatric history from progress note on 03/11/2018.  Past Medical History:  Past Medical History:  Diagnosis Date   Anxiety    Basal cell carcinoma    L thigh, txted ~1990 in [place]   Rosacea    ocular, on chronic doxycycline treatment   Thyroid disease     Past Surgical History:  Procedure Laterality Date   ABDOMINAL HYSTERECTOMY  1992   Total   BREAST BIOPSY Right 07/20/2015   neg fat necrosis   BREAST BIOPSY Left 01/24/2023   [place] Bx, Venus Clip, Path pending   BREAST BIOPSY Left 01/24/2023   [place] LT BREAST BX W LOC DEV 1ST LESION IMG BX SPEC US GUIDE 01/24/2023 [facility]   BREAST BIOPSY  03/13/2023   MM LT RADIOACTIVE SEED LOC MAMMO GUIDE 03/13/2023 [facility]   BREAST EXCISIONAL BIOPSY Right 1960's   negative. No scar seen   CERVICAL FUSION  2005,2010   C2-C7   RADIOACTIVE SEED GUIDED EXCISIONAL BREAST BIOPSY Left 03/13/2023   Procedure: RADIOACTIVE SEED GUIDED EXCISIONAL LEFT BREAST BIOPSY;  Surgeon: Emelia Loron, MD;  Location: [facility];  Service: General;  Laterality: Left;   REDUCTION MAMMAPLASTY Bilateral 2016   TONSILLECTOMY  1961    Family Psychiatric History: I have reviewed family psychiatric  history from progress note on 03/11/2018.  Family History:  Family History  Problem Relation Age of Onset   Cancer Mother        Ovarian   Anxiety disorder Mother    Depression Mother    Heart disease Father    Stroke Father    Diabetes Father    ADD / ADHD Daughter    Cancer Maternal Grandmother    ADD / ADHD Grandchild    Prostate cancer Neg Hx    Kidney cancer Neg Hx    Breast cancer Neg Hx    Colon cancer Neg Hx     Social History: I  have reviewed social history from progress note on 03/11/2018. Social History   Socioeconomic History   Marital status: Married    Spouse name: jeff   Number of children: 2   Years of education: Not on file   Highest education level: Master's degree (e.g., MA, MS, MEng, MEd, MSW, MBA)  Occupational History    Comment: retired  Tobacco Use   Smoking status: Never   Smokeless tobacco: Never  Vaping Use   Vaping Use: Never used  Substance and Sexual Activity   Alcohol use: Yes    Alcohol/week: 1.0 standard drink of alcohol    Types: 1 Glasses of wine per week    Comment: occasional to rare use   Drug use: No   Sexual activity: Yes    Birth control/protection: None  Other Topics Concern   Not on file  Social History Narrative   Married 2004   3 kids total (she has two, huband has one)   4 grandkids alive, 1 had passed away   Retired.     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   Social Determinants of Health   Financial Resource Strain: Low Risk  (11/21/2017)   Overall Financial Resource Strain (CARDIA)    Difficulty of Paying Living Expenses: Not hard at all  Food Insecurity: No Food Insecurity (11/21/2017)   Hunger Vital Sign    Worried About Running Out of Food in the Last Year: Never true    Ran Out of Food in the Last Year: Never true  Transportation Needs: No Transportation Needs (11/21/2017)   PRAPARE - Administrator, Civil Service (Medical): No    Lack of Transportation (Non-Medical): No  Physical Activity: Inactive (11/21/2017)   Exercise Vital Sign    Days of Exercise per Week: 0 days    Minutes of Exercise per Session: 0 min  Stress: Stress Concern Present (11/21/2017)   [employer] of Occupational Health - Occupational Stress Questionnaire    Feeling of Stress : Very much  Social Connections: Moderately Integrated (11/21/2017)   Social Connection and Isolation Panel [NHANES]    Frequency of Communication  with Friends and Family: More than three times a week    Frequency of Social Gatherings with Friends and Family: Three times a week    Attends Religious Services: More than 4 times per year    Active Member of Clubs or Organizations: No    Attends Banker Meetings: Never    Marital Status: Married    Allergies:  Allergies  Allergen Reactions   Codeine Hives   Iodinated Contrast Media Itching   Metrizamide Itching   Vancomycin Itching    "Requests benadryl given with vancomycin"    Metabolic Disorder Labs: No results found for: "HGBA1C", "MPG" No results found for: "PROLACTIN" Lab Results  Component Value Date   CHOL 151 11/30/2022   TRIG 83.0 11/30/2022   HDL 68.20 11/30/2022   CHOLHDL 2 11/30/2022   VLDL 16.6 11/30/2022   LDLCALC 66 11/30/2022   LDLCALC 75 06/12/2022   Lab Results  Component Value Date   TSH 2.68 11/30/2022   TSH 2.140 10/10/2021    Therapeutic Level Labs: No results found for: "LITHIUM" No results found for: "VALPROATE" No results found for: "CBMZ"  Current Medications: Current Outpatient Medications  Medication Sig Dispense Refill   acyclovir (ZOVIRAX) 200 MG capsule Take 1 capsule (200 mg total) by mouth 3 (three) times daily as needed. 30 capsule 1   Biotin 5 MG CAPS Take by mouth.     Cholecalciferol (VITAMIN D3) 50 MCG (2000 UT) capsule Take by mouth.     doxycycline (VIBRAMYCIN) 50 MG capsule Take 1 capsule (50 mg total) by mouth 2 (two) times daily. 180 capsule 3   ibuprofen (ADVIL,MOTRIN) 200 MG tablet Take 200 mg by mouth every 6 (six) hours as needed for moderate pain.     levothyroxine (SYNTHROID) 75 MCG tablet Take 1 tablet (75 mcg total) by mouth daily before breakfast. 90 tablet 3   mirtazapine (REMERON) 7.5 MG tablet Take 1 tablet (7.5 mg total) by mouth at bedtime. 90 tablet 3   Multiple Vitamins-Minerals (OCUVITE ADULT 50+ PO) Take by mouth.     Omega-3 Fatty Acids (FISH OIL) 1000 MG CAPS Take by mouth.      propranolol (INDERAL) 10 MG tablet Take 1 tablet (10 mg total) by mouth 3 (three) times daily as needed. 270 tablet 3   simvastatin (ZOCOR) 20 MG tablet Take 1 tablet (20 mg total) by mouth at bedtime. 90 tablet 3   solifenacin (VESICARE) 10 MG tablet Take 1 tablet (10 mg total) by mouth daily. With extra 1/2 tab daily if needed. 135 tablet 3   White Petrolatum-Mineral Oil (GENTEAL TEARS NIGHT-TIME) OINT Apply to eye.     No current facility-administered medications for this visit.   Facility-Administered Medications Ordered in Other Visits  Medication Dose Route Frequency Provider Last Rate Last Admin   diphenhydrAMINE (BENADRYL) injection 25 mg  25 mg Intravenous Once Emelia Loron, MD         Musculoskeletal: Strength & Muscle Tone:  UTA Gait & Station:  Seated Patient leans: N/A  Psychiatric Specialty Exam: Review of Systems  Neurological:  Positive for tremors (chronic).  Psychiatric/Behavioral: Negative.    All other systems reviewed and are negative.   There were no vitals taken for this visit.There is no height or weight on file to calculate BMI.  General Appearance: Casual  Eye Contact:  Fair  Speech:  Clear and Coherent  Volume:  Normal  Mood:  Euthymic  Affect:  Congruent  Thought Process:  Goal Directed and Descriptions of Associations: Intact  Orientation:  Full (Time, Place, and Person)  Thought Content: Logical   Suicidal Thoughts:  No  Homicidal Thoughts:  No  Memory:  Immediate;   Fair Recent;   Fair Remote;   Fair  Judgement:  Fair  Insight:  Fair  Psychomotor Activity:   postural tremors on and off  Concentration:  Concentration: Fair and Attention Span: Fair  Recall:  [employer] of Knowledge: Fair  Language: Fair  Akathisia:  No  Handed:  Right  AIMS (if indicated): not done  Assets:  Communication Skills Desire for Improvement Housing Social Support  ADL's:  Intact  Cognition: WNL  Sleep:  Fair  Screenings: GAD-7    Flowsheet Row  Office Visit from 02/08/2023 in [facility] Video Visit from 08/01/2022 in [facility] Video Visit from 02/02/2022 in [facility] Video Visit from 07/21/2021 in [facility] Video Visit from 01/12/2021 in [facility]  Total GAD-7 Score 0 3 6 3 3       PHQ2-9    Flowsheet Row Video Visit from 04/04/2023 in [facility] Office Visit from 02/08/2023 in [facility] Video Visit from 08/01/2022 in [facility] Video Visit from 02/02/2022 in [facility] Video Visit from 07/21/2021 in [facility]  PHQ-2 Total Score 0 0 1 0 0  PHQ-9 Total Score -- 1 -- -- --      Flowsheet Row Video Visit from 04/04/2023 in [facility] Admission (Discharged) from 03/13/2023 in [facility] Video Visit from 08/01/2022 in [facility]  C-SSRS RISK CATEGORY No Risk Error: Question 6 not populated No Risk        Assessment and Plan: Rosali Augello is a 72 year old Caucasian female who is married, retired, has a history of PTSD, anxiety was evaluated by telemedicine today.  Patient is currently stable, will benefit from medication management as well as psychotherapy sessions.  Plan as noted below.  Plan GAD-stable Mirtazapine 7.5 mg p.o. nightly for sleep Propranolol 10 mg p.o. 3 times daily as needed Continue CBT with Ms. Felecia Jan  PTSD-stable Mirtazapine 7.5 mg p.o. nightly for sleep Continue CBT   I have reviewed notes per Dr.Toler-Aros -dated 01/30/2023-patient with cellulitis left breast-started after breast biopsy.  Patient was treated with  antibiotics.'   Collaboration of Care: Collaboration of Care: Other I have coordinated care with Ms. Tina Thompson-therapist, patient will continue to need CBT  Patient/Guardian was advised Release of Information must be obtained prior to any record release in order to collaborate their care with an outside provider. Patient/Guardian was advised if they have not already done so to contact the registration department to sign all necessary forms in order for [place] to release information regarding their care.   Consent: Patient/Guardian gives verbal consent for treatment and assignment of benefits for services provided during this visit. Patient/Guardian expressed understanding and agreed to proceed.   Follow-up in clinic in 3 - 4 months or sooner if needed.  This note was generated in part or whole with voice recognition software. Voice recognition is usually quite accurate but there are transcription errors that can and very often do occur. I apologize for any typographical errors that were not detected and corrected.    Jomarie Longs, MD 04/06/2023, 1:05 PM

## 2023-04-05 ENCOUNTER — Inpatient Hospital Stay: Payer: Medicare Other

## 2023-04-05 ENCOUNTER — Inpatient Hospital Stay: Payer: Medicare Other | Admitting: Genetic Counselor

## 2023-04-05 ENCOUNTER — Other Ambulatory Visit: Payer: Self-pay

## 2023-04-05 DIAGNOSIS — Z8041 Family history of malignant neoplasm of ovary: Secondary | ICD-10-CM | POA: Diagnosis not present

## 2023-04-05 DIAGNOSIS — Z1379 Encounter for other screening for genetic and chromosomal anomalies: Secondary | ICD-10-CM | POA: Diagnosis not present

## 2023-04-05 DIAGNOSIS — Z85820 Personal history of malignant melanoma of skin: Secondary | ICD-10-CM | POA: Diagnosis not present

## 2023-04-05 LAB — GENETIC SCREENING ORDER

## 2023-04-05 NOTE — Progress Notes (Signed)
REFERRING PROVIDER: Emelia Loron, MD [address] [place],  [place] [address]  PRIMARY PROVIDER:  Joaquim Nam, MD  PRIMARY REASON FOR VISIT:  Encounter Diagnoses  Name Primary?   Family history of ovarian cancer Yes   Personal history of malignant melanoma     HISTORY OF PRESENT ILLNESS:   [name], a 72 y.o. female, was seen for a [facility] cancer genetics consultation at the request of Dr. Dwain Sarna due to a family history of ovarian cancer.  [name] presents to clinic today to discuss the possibility of a hereditary predisposition to cancer, to discuss genetic testing, and to further clarify her future cancer risks, as well as potential cancer risks for family members.   [name] has a history of melanoma on her thigh diagnosed at age 59 s/p excision.  She did not report any other personal history of cancer.  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  Left breast lumpectomy in April 2024 revealed usual ductal hyperplasia with no features of atypical ductal hyperlasia or malignancy.   CANCER HISTORY:  Oncology History   No history exists.     RISK FACTORS:  Mammogram within the last year: yes; category b density  Hx breast biopsy---see history noted above.  Colonoscopy: yes; in 2017; f/u in 61yrs Hysterectomy and BSO in 1992. Menarche was at age 13.  First live birth at age 72.  Menopausal status: postmenopausal.  HRT use: 1 year; estrogen, last used 27 years ago Dermatology screening: annually  Past Medical History:  Diagnosis Date   Anxiety    Basal cell carcinoma    L thigh, txted ~1990 in [place]   Rosacea    ocular, on chronic doxycycline treatment   Thyroid disease     Past Surgical History:  Procedure Laterality Date   ABDOMINAL HYSTERECTOMY  1992   Total   BREAST BIOPSY Right 07/20/2015   neg fat necrosis   BREAST BIOPSY Left 01/24/2023   [place]  Bx, Venus Clip, Path pending   BREAST BIOPSY Left 01/24/2023   [place] LT BREAST BX W LOC DEV 1ST LESION IMG BX SPEC US GUIDE 01/24/2023 [facility]   BREAST BIOPSY  03/13/2023   MM LT RADIOACTIVE SEED LOC MAMMO GUIDE 03/13/2023 [facility]   BREAST EXCISIONAL BIOPSY Right 1960's   negative. No scar seen   CERVICAL FUSION  2005,2010   C2-C7   RADIOACTIVE SEED GUIDED EXCISIONAL BREAST BIOPSY Left 03/13/2023   Procedure: RADIOACTIVE SEED GUIDED EXCISIONAL LEFT BREAST BIOPSY;  Surgeon: Emelia Loron, MD;  Location: [facility];  Service: General;  Laterality: Left;   REDUCTION MAMMAPLASTY Bilateral 2016   TONSILLECTOMY  1961    FAMILY HISTORY:  We obtained a detailed, 4-generation family history.  Significant diagnoses are listed below: Family History  Problem Relation Age of Onset   Ovarian cancer Mother 15   Lung cancer Maternal Uncle        d. 42; smoking hx   Cancer Maternal Grandmother 26       ovarian or uterine?   Melanoma Cousin        mat female cousin; dx 45s; back   Cancer Cousin        mat female cousins x2; unknown primary w/ mets      [name] is unaware of previous family history of genetic testing for hereditary cancer risks.  There is no reported Ashkenazi Jewish ancestry.  There is no known consanguinity.  GENETIC COUNSELING ASSESSMENT: [name] is a 72 y.o. female with a personal and family history of cancer which is somewhat suggestive of a hereditary cancer syndrome and predisposition to cancer given the presence of related cancers (melanoma, ovarian, etc) in the family. We, therefore, discussed and recommended the following at today's visit.   DISCUSSION: We discussed that 5 - 10% of cancer is hereditary, with most cases of hereditary ovarian cancer associated with mutations in BRCA1//2.  There are other genes that can be associated with hereditary ovarian or melanoma cancer syndromes.  We discussed that testing is beneficial for several  reasons, including knowing about other cancer risks, identifying potential screening and risk-reduction options that may be appropriate, and to understanding if other family members could be at risk for cancer and allowing them to undergo genetic testing.  We reviewed the characteristics, features and inheritance patterns of hereditary cancer syndromes. We also discussed genetic testing, including the appropriate family members to test, the process of testing, insurance coverage and turn-around-time for results. We discussed the implications of a negative, positive, and variant of uncertain significant result. We recommended [name] pursue genetic testing for a panel that contains genes associated with ovarian cancer, melanoma, and other cancers.  The Multi-Cancer + RNA Panel offered by [facility] includes sequencing and/or deletion/duplication analysis of the following 70 genes:  AIP*, ALK, APC*, ATM*, AXIN2*, BAP1*, BARD1*, BLM*, BMPR1A*, BRCA1*, BRCA2*, BRIP1*, CDC73*, CDH1*, CDK4, CDKN1B*, CDKN2A, CHEK2*, CTNNA1*, DICER1*, EPCAM (del/dup only), EGFR, FH*, FLCN*, GREM1 (promoter dup only), HOXB13, KIT, LZTR1, MAX*, MBD4, MEN1*, MET, MITF, MLH1*, MSH2*, MSH3*, MSH6*, MUTYH*, NF1*, NF2*, NTHL1*, PALB2*, PDGFRA, PMS2*, POLD1*, POLE*, POT1*, PRKAR1A*, PTCH1*, PTEN*, RAD51C*, RAD51D*, RB1*, RET, SDHA* (sequencing only), SDHAF2*, SDHB*, SDHC*, SDHD*, SMAD4*, SMARCA4*, SMARCB1*, SMARCE1*, STK11*, SUFU*, TMEM127*, TP53*, TSC1*, TSC2*, VHL*. RNA analysis is performed for * genes.  Based on [name] personal and family history of cancer, she meets medical criteria for genetic testing.  We discussed the Genetic Information Non-Discrimination Act (GINA) of 2008, which helps protect individuals against genetic discrimination based on their genetic test results.  It impacts both health insurance and employment.  With health insurance, it protects against genetic test results being used for increased premiums or  policy termination. For employment, it protects against hiring, firing and promoting decisions based on genetic test results.  GINA does not apply to those in the [employer], those who work for companies with less than 15 employees, and new life insurance or long-term disability insurance policies.  Health status due to a cancer diagnosis is not protected under GINA.  PLAN: After considering the risks, benefits, and limitations, [name] provided informed consent to pursue genetic testing and the blood sample was sent to [facility] for analysis of the Multi-Cancer +RNA Panel. Results should be available within approximately 3 weeks' time, at which point they will be disclosed by telephone to [name], as will any additional recommendations warranted by these results. [name] will receive a summary of her genetic counseling visit and a copy of her results once available. This information will also be available in Epic.   [name] questions were answered to her satisfaction today. Our contact information was provided should additional questions or concerns arise. Thank you for the referral and allowing [place] to share in the care of your patient.   Bowe Sidor M. Rennie Plowman, MS, [facility] Genetic Counselor [email or web address] (P) [phone]   The patient was seen for a total of 30 minutes  in face-to-face genetic counseling.  The patient was seen alone.  Drs. Pamelia Hoit and/or Mosetta Putt were available to discuss this case as needed.  _______________________________________________________________________ For Office Staff:  Number of people involved in session: 1 Was an Intern/ student involved with case: no

## 2023-04-10 ENCOUNTER — Ambulatory Visit (INDEPENDENT_AMBULATORY_CARE_PROVIDER_SITE_OTHER): Payer: Self-pay | Admitting: Dermatology

## 2023-04-10 VITALS — BP 119/69 | HR 59

## 2023-04-10 DIAGNOSIS — L988 Other specified disorders of the skin and subcutaneous tissue: Secondary | ICD-10-CM

## 2023-04-10 NOTE — Patient Instructions (Signed)
Due to recent changes in healthcare laws, you may see results of your pathology and/or laboratory studies on MyChart before the doctors have had a chance to review them. We understand that in some cases there may be results that are confusing or concerning to you. Please understand that not all results are received at the same time and often the doctors may need to interpret multiple results in order to provide you with the best plan of care or course of treatment. Therefore, we ask that you please give us 2 business days to thoroughly review all your results before contacting the office for clarification. Should we see a critical lab result, you will be contacted sooner.   If You Need Anything After Your Visit  If you have any questions or concerns for your doctor, please call our main line at [phone] and press option 4 to reach your doctor's medical assistant. If no one answers, please leave a voicemail as directed and we will return your call as soon as possible. Messages left after 4 pm will be answered the following business day.   You may also send us a message via MyChart. We typically respond to MyChart messages within 1-2 business days.  For prescription refills, please ask your pharmacy to contact our office. Our fax number is [phone].  If you have an urgent issue when the clinic is closed that cannot wait until the next business day, you can page your doctor at the number below.    Please note that while we do our best to be available for urgent issues outside of office hours, we are not available 24/7.   If you have an urgent issue and are unable to reach us, you may choose to seek medical care at your doctor's office, retail clinic, urgent care center, or emergency room.  If you have a medical emergency, please immediately call 911 or go to the emergency department.  Pager Numbers  - Dr. Kowalski: [phone]  - Dr. Moye: [phone]  - Dr. Stewart:  [phone]  In the event of inclement weather, please call our main line at [phone] for an update on the status of any delays or closures.  Dermatology Medication Tips: Please keep the boxes that topical medications come in in order to help keep track of the instructions about where and how to use these. Pharmacies typically print the medication instructions only on the boxes and not directly on the medication tubes.   If your medication is too expensive, please contact our office at [phone] option 4 or send us a message through MyChart.   We are unable to tell what your co-pay for medications will be in advance as this is different depending on your insurance coverage. However, we may be able to find a substitute medication at lower cost or fill out paperwork to get insurance to cover a needed medication.   If a prior authorization is required to get your medication covered by your insurance company, please allow us 1-2 business days to complete this process.  Drug prices often vary depending on where the prescription is filled and some pharmacies may offer cheaper prices.  The website www.goodrx.com contains coupons for medications through different pharmacies. The prices here do not account for what the cost may be with help from insurance (it may be cheaper with your insurance), but the website can give you the price if you did not use any insurance.  - You can print the associated coupon and take it with   your prescription to the pharmacy.  - You may also stop by our office during regular business hours and pick up a GoodRx coupon card.  - If you need your prescription sent electronically to a different pharmacy, notify our office through [facility] MyChart or by phone at [phone] option 4.     Si Usted Necesita Algo Despus de Su Visita  Tambin puede enviarnos un mensaje a travs de MyChart. Por lo general respondemos a los mensajes de MyChart en el transcurso de 1 a 2  das hbiles.  Para renovar recetas, por favor pida a su farmacia que se ponga en contacto con nuestra oficina. Nuestro nmero de fax es el [phone].  Si tiene un asunto urgente cuando la clnica est cerrada y que no puede esperar hasta el siguiente da hbil, puede llamar/localizar a su doctor(a) al nmero que aparece a continuacin.   Por favor, tenga en cuenta que aunque hacemos todo lo posible para estar disponibles para asuntos urgentes fuera del horario de oficina, no estamos disponibles las 24 horas del da, los 7 das de la semana.   Si tiene un problema urgente y no puede comunicarse con nosotros, puede optar por buscar atencin mdica  en el consultorio de su doctor(a), en una clnica privada, en un centro de atencin urgente o en una sala de emergencias.  Si tiene una emergencia mdica, por favor llame inmediatamente al 911 o vaya a la sala de emergencias.  Nmeros de bper  - Dr. Kowalski: [phone]  - Dra. Moye: [phone]  - Dra. Stewart: [phone]  En caso de inclemencias del tiempo, por favor llame a nuestra lnea principal al [phone] para una actualizacin sobre el estado de cualquier retraso o cierre.  Consejos para la medicacin en dermatologa: Por favor, guarde las cajas en las que vienen los medicamentos de uso tpico para ayudarle a seguir las instrucciones sobre dnde y cmo usarlos. Las farmacias generalmente imprimen las instrucciones del medicamento slo en las cajas y no directamente en los tubos del medicamento.   Si su medicamento es muy caro, por favor, pngase en contacto con nuestra oficina llamando al [phone] y presione la opcin 4 o envenos un mensaje a travs de MyChart.   No podemos decirle cul ser su copago por los medicamentos por adelantado ya que esto es diferente dependiendo de la cobertura de su seguro. Sin embargo, es posible que podamos encontrar un medicamento sustituto a menor costo o llenar un formulario para que el  seguro cubra el medicamento que se considera necesario.   Si se requiere una autorizacin previa para que su compaa de seguros cubra su medicamento, por favor permtanos de 1 a 2 das hbiles para completar este proceso.  Los precios de los medicamentos varan con frecuencia dependiendo del lugar de dnde se surte la receta y alguna farmacias pueden ofrecer precios ms baratos.  El sitio web www.goodrx.com tiene cupones para medicamentos de diferentes farmacias. Los precios aqu no tienen en cuenta lo que podra costar con la ayuda del seguro (puede ser ms barato con su seguro), pero el sitio web puede darle el precio si no utiliz ningn seguro.  - Puede imprimir el cupn correspondiente y llevarlo con su receta a la farmacia.  - Tambin puede pasar por nuestra oficina durante el horario de atencin regular y recoger una tarjeta de cupones de GoodRx.  - Si necesita que su receta se enve electrnicamente a una farmacia diferente, informe a nuestra oficina a travs de MyChart de [facility]   o por telfono llamando al [phone] y presione la opcin 4.  

## 2023-04-10 NOTE — Progress Notes (Signed)
   Follow-Up Visit   Subjective  Lindaann Gradilla is a 72 y.o. female who presents for the following: Botox for facial elastosis  The following portions of the chart were reviewed this encounter and updated as appropriate: medications, allergies, medical history  Review of Systems:  No other skin or systemic complaints except as noted in HPI or Assessment and Plan.  Objective  Well appearing patient in no apparent distress; mood and affect are within normal limits.  A focused examination was performed of the face.  Relevant physical exam findings are noted in the Assessment and Plan.   Assessment & Plan   Facial Elastosis  Botox 20 units injected today to: - Frown complex 20 units   Location: See attached image  Informed consent: Discussed risks (infection, pain, bleeding, bruising, swelling, allergic reaction, paralysis of nearby muscles, eyelid droop, double vision, neck weakness, difficulty breathing, headache, undesirable cosmetic result, and need for additional treatment) and benefits of the procedure, as well as the alternatives.  Informed consent was obtained.  Preparation: The area was cleansed with alcohol.  Procedure Details:  Botox was injected into the dermis with a 30-gauge needle. Pressure applied to any bleeding. Ice packs offered for swelling.  Lot Number:  [id number] Expiration:  04/26  Total Units Injected:  20  Plan: Tylenol may be used for headache.  Allow 2 weeks before returning to clinic for additional dosing as needed. Patient will call for any problems.  Discussed oral collagen to help improve skin texture and crepey skin. Discussed BBL, Halo, and Fraxel laser.   Return in about 4 months (around 08/10/2023) for Botox injections.  Maylene Roes, CMA, am acting as scribe for Armida Sans, MD .   Documentation: I have reviewed the above documentation for accuracy and completeness, and I agree with the above.  Armida Sans, MD

## 2023-04-11 ENCOUNTER — Encounter: Payer: Self-pay | Admitting: Genetic Counselor

## 2023-04-15 ENCOUNTER — Encounter: Payer: Self-pay | Admitting: Dermatology

## 2023-04-23 ENCOUNTER — Ambulatory Visit: Payer: Self-pay | Admitting: Genetic Counselor

## 2023-04-23 ENCOUNTER — Telehealth: Payer: Self-pay | Admitting: Genetic Counselor

## 2023-04-23 DIAGNOSIS — Z85820 Personal history of malignant melanoma of skin: Secondary | ICD-10-CM

## 2023-04-23 DIAGNOSIS — Z8041 Family history of malignant neoplasm of ovary: Secondary | ICD-10-CM

## 2023-04-23 DIAGNOSIS — Z1379 Encounter for other screening for genetic and chromosomal anomalies: Secondary | ICD-10-CM

## 2023-04-23 NOTE — Telephone Encounter (Signed)
Disclosed negative genetic testing

## 2023-04-24 DIAGNOSIS — Z1379 Encounter for other screening for genetic and chromosomal anomalies: Secondary | ICD-10-CM | POA: Insufficient documentation

## 2023-04-24 DIAGNOSIS — Z0000 Encounter for general adult medical examination without abnormal findings: Secondary | ICD-10-CM | POA: Insufficient documentation

## 2023-04-24 NOTE — Progress Notes (Signed)
HPI:   Ms. Whitescarver was previously seen in the [facility] clinic due to a family history of ovarian cancer and concerns regarding a hereditary predisposition to cancer.    Ms. Usrey recent genetic test results were disclosed to her by telephone. These results and recommendations are discussed in more detail below.  CANCER HISTORY:  Ms. Dysinger has a history of melanoma on her thigh diagnosed at age 72 s/p excision.  She did not report any other personal history of cancer.  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  Left breast lumpectomy in April 2024 revealed usual ductal hyperplasia with no features of atypical ductal hyperlasia or malignancy.    FAMILY HISTORY:  We obtained a detailed, 4-generation family history.  Significant diagnoses are listed below:      Family History  Problem Relation Age of Onset   Ovarian cancer Mother 36   Lung cancer Maternal Uncle          d. 44; smoking hx   Cancer Maternal Grandmother 62        ovarian or uterine?   Melanoma Cousin          mat female cousin; dx 45s; back   Cancer Cousin          mat female cousins x2; unknown primary w/ mets         Ms. Adger is unaware of previous family history of genetic testing for hereditary cancer risks.  There is no reported Ashkenazi Jewish ancestry. There is no known consanguinity.    GENETIC TEST RESULTS:  The [facility] Multi-Cancer +RNA Panel found no pathogenic mutations.   The Multi-Cancer + RNA Panel offered by [facility] includes sequencing and/or deletion/duplication analysis of the following 70 genes:  AIP*, ALK, APC*, ATM*, AXIN2*, BAP1*, BARD1*, BLM*, BMPR1A*, BRCA1*, BRCA2*, BRIP1*, CDC73*, CDH1*, CDK4, CDKN1B*, CDKN2A, CHEK2*, CTNNA1*, DICER1*, EPCAM (del/dup only), EGFR, FH*, FLCN*, GREM1 (promoter dup only), HOXB13, KIT, LZTR1, MAX*, MBD4, MEN1*, MET, MITF, MLH1*, MSH2*, MSH3*, MSH6*, MUTYH*, NF1*, NF2*,  NTHL1*, PALB2*, PDGFRA, PMS2*, POLD1*, POLE*, POT1*, PRKAR1A*, PTCH1*, PTEN*, RAD51C*, RAD51D*, RB1*, RET, SDHA* (sequencing only), SDHAF2*, SDHB*, SDHC*, SDHD*, SMAD4*, SMARCA4*, SMARCB1*, SMARCE1*, STK11*, SUFU*, TMEM127*, TP53*, TSC1*, TSC2*, VHL*. RNA analysis is performed for * genes.   The test report has been scanned into EPIC and is located under the Molecular Pathology section of the Results Review tab.  A portion of the result report is included below for reference. Genetic testing reported out on 04/11/2023.       Even though a pathogenic variant was not identified, possible explanations for the cancer in the family may include: There may be no hereditary risk for cancer in the family. The cancers in Ms. Beste and/or her family may be sporadic/familial or due to other genetic and environmental factors. There may be a gene mutation in one of these genes that current testing methods cannot detect but that chance is small. There could be another gene that has not yet been discovered, or that we have not yet tested, that is responsible for the cancer diagnoses in the family.  It is also possible there is a hereditary cause for the cancer in the family that Ms. Kem did not inherit.   Therefore, it is important to remain in touch with cancer genetics in the future so that we can continue to offer Ms. Rosenkranz the most up to date genetic testing.  ADDITIONAL GENETIC TESTING:   Ms. Verbeek genetic testing was fairly extensive.  If there are additional relevant genes identified to increase cancer risk that can be analyzed in the future, we would be happy to discuss and coordinate this testing at that time.    CANCER SCREENING RECOMMENDATIONS:  Ms. Gasbarro's test result is considered negative (normal).  This means that we have not identified a hereditary cause for her family history of cancer at this time.   An individual's cancer risk and medical management are not determined by genetic  test results alone. Overall cancer risk assessment incorporates additional factors, including personal medical history, family history, and any available genetic information that may result in a personalized plan for cancer prevention and surveillance. Therefore, it is recommended she continue to follow the cancer management and screening guidelines provided by her  primary healthcare provider.  We discussed that she should continue management recommendations provided by her surgical oncologist given her history of ADH.  RECOMMENDATIONS FOR FAMILY MEMBERS:   Since she did not inherit a identifiable mutation in a cancer predisposition gene included on this panel, her children could not have inherited a known mutation from her in one of these genes. Individuals in this family might be at some increased risk of developing cancer, over the general population risk, due to the family history of cancer.  Individuals in the family should notify their providers of the family history of cancer. We recommend women in this family have a yearly mammogram beginning at age 70, or 10 years younger than the earliest onset of cancer, an annual clinical breast exam, and perform monthly breast self-exams.  Risk models that take into account family history and hormonal history may be helpful in determining appropriate breast cancer screening options for family members. Female relatives should speak with their providers about prostate cancer screening.  Other members of the family may still carry a pathogenic variant in one of these genes that Ms. Wolpert did not inherit. Based on the family history of ovarian cancer in her mother, we recommend her sister have genetic counseling and testing. Ms. Reep can let us know if we can be of any assistance in coordinating genetic counseling and/or testing for these family member.     FOLLOW-UP:  Cancer genetics is a rapidly advancing field and it is possible that new genetic tests will be  appropriate for her and/or her family members in the future. We encourage Ms. Dales to remain in contact with cancer genetics, so we can update her personal and family histories and let her know of advances in cancer genetics that may benefit this family.   Our contact number was provided.. She knows she is welcome to call us at anytime with additional questions or concerns.   Stasha Naraine M. Rennie Plowman, MS, [facility] Genetic Counselor [email or web address] (P) [phone]

## 2023-04-28 ENCOUNTER — Encounter: Payer: Self-pay | Admitting: Family Medicine

## 2023-04-30 MED ORDER — LEVOTHYROXINE SODIUM 75 MCG PO TABS: 75.0000 ug | ORAL_TABLET | Freq: Every day | ORAL | 3 refills | Status: DC

## 2023-04-30 MED ORDER — SOLIFENACIN SUCCINATE 10 MG PO TABS: 10.0000 mg | ORAL_TABLET | Freq: Every day | ORAL | 3 refills | Status: DC

## 2023-05-22 ENCOUNTER — Encounter: Payer: Self-pay | Admitting: Family Medicine

## 2023-06-05 ENCOUNTER — Encounter: Payer: Self-pay | Admitting: Family Medicine

## 2023-06-06 ENCOUNTER — Other Ambulatory Visit: Payer: Self-pay | Admitting: Family Medicine

## 2023-06-06 MED ORDER — SOLIFENACIN SUCCINATE 10 MG PO TABS: 10.0000 mg | ORAL_TABLET | Freq: Every day | ORAL | 3 refills | Status: DC

## 2023-07-17 ENCOUNTER — Ambulatory Visit (INDEPENDENT_AMBULATORY_CARE_PROVIDER_SITE_OTHER): Payer: Self-pay | Admitting: Dermatology

## 2023-07-17 DIAGNOSIS — L988 Other specified disorders of the skin and subcutaneous tissue: Secondary | ICD-10-CM

## 2023-07-17 NOTE — Patient Instructions (Signed)

## 2023-07-17 NOTE — Progress Notes (Signed)
   Follow-Up Visit   Subjective  [name] is a 72 y.o. female who presents for the following: Botox for facial elastosis  The following portions of the chart were reviewed this encounter and updated as appropriate: medications, allergies, medical history  Review of Systems:  No other skin or systemic complaints except as noted in HPI or Assessment and Plan.  Objective  Well appearing patient in no apparent distress; mood and affect are within normal limits.  A focused examination was performed of the face.  Relevant physical exam findings are noted in the Assessment and Plan.   Assessment & Plan    Facial Elastosis  Location: See attached image  Informed consent: Discussed risks (infection, pain, bleeding, bruising, swelling, allergic reaction, paralysis of nearby muscles, eyelid droop, double vision, neck weakness, difficulty breathing, headache, undesirable cosmetic result, and need for additional treatment) and benefits of the procedure, as well as the alternatives.  Informed consent was obtained.  Preparation: The area was cleansed with alcohol.  Procedure Details:  Botox was injected into the dermis with a 30-gauge needle. Pressure applied to any bleeding. Ice packs offered for swelling.  Lot Number:  [id number] Expiration:  05/12/2023  Total Units Injected:  20  Plan: Tylenol may be used for headache.  Allow 2 weeks before returning to clinic for additional dosing as needed. Patient will call for any problems.  Return in about 3 months (around 10/17/2023) for Botox .  IAngelique Holm, CMA, am acting as scribe for Armida Sans, MD .   Documentation: I have reviewed the above documentation for accuracy and completeness, and I agree with the above.  Armida Sans, MD

## 2023-07-25 ENCOUNTER — Encounter: Payer: Self-pay | Admitting: Dermatology

## 2023-07-26 ENCOUNTER — Encounter: Payer: Self-pay | Admitting: Psychiatry

## 2023-07-26 ENCOUNTER — Ambulatory Visit (INDEPENDENT_AMBULATORY_CARE_PROVIDER_SITE_OTHER): Payer: Medicare Other | Admitting: Psychiatry

## 2023-07-26 VITALS — BP 115/71 | HR 62 | Temp 97.1°F | Ht 65.0 in | Wt 156.0 lb

## 2023-07-26 DIAGNOSIS — F411 Generalized anxiety disorder: Secondary | ICD-10-CM | POA: Diagnosis not present

## 2023-07-26 DIAGNOSIS — F4312 Post-traumatic stress disorder, chronic: Secondary | ICD-10-CM

## 2023-07-26 MED ORDER — MIRTAZAPINE 7.5 MG PO TABS
7.5000 mg | ORAL_TABLET | Freq: Every day | ORAL | 3 refills | Status: DC
Start: 2023-07-26 — End: 2024-07-21

## 2023-07-26 NOTE — Progress Notes (Signed)
BH MD OP Progress Note  07/26/2023 12:16 PM [name]  MRN:  [id number]  Chief Complaint:  Chief Complaint  Patient presents with   Follow-up   Anxiety   Post-Traumatic Stress Disorder   Medication Refill   HPI: [name] is a 72 year old Caucasian female, retired, married, lives in [place], has a history of PTSD, GAD was evaluated in office today.  Patient today reports she is currently doing well with regards to her mood.  She tends to stay active in several different activities.  She is a Engineer, water for [employer], she is part of a book club and writers club, also reports she is currently has several different writing projects.  Patient reports she and her spouse are doing well with their relationship.  They are planning to take a trip to [place] soon.  Patient reports she looks forward to that.  She also has been walking a lot which is a good exercise for her.  Patient reports she does a lot of gardening.  She enjoys that.  She has a new grandson born a couple of days ago.  Patient reports she is planning to spend some time with her grandson.  Patient continues to follow-up with her therapist on a regular basis.  Reports therapy sessions as helpful.  Currently compliant on the mirtazapine.  Denies side effects.  Reports sleep and appetite is fair.  Denies any tremors at this time.  Does have propranolol available which she uses and it helps.  Patient denies any suicidality, homicidality or perceptual disturbances.  Patient reports she is planning to get all her labs done when she goes for her annual physical exam soon.  Denies any other concerns today.  Visit Diagnosis:    ICD-10-CM   1. GAD (generalized anxiety disorder)  F41.1 mirtazapine (REMERON) 7.5 MG tablet    2. Chronic post-traumatic stress disorder (PTSD)  F43.12       Past Psychiatric History: I have reviewed past psychiatric history from progress note on 03/11/2018.  Past Medical History:   Past Medical History:  Diagnosis Date   Anxiety    Basal cell carcinoma    L thigh, txted ~1990 in [place]   Rosacea    ocular, on chronic doxycycline treatment   Thyroid disease     Past Surgical History:  Procedure Laterality Date   ABDOMINAL HYSTERECTOMY  1992   Total   BREAST BIOPSY Right 07/20/2015   neg fat necrosis   BREAST BIOPSY Left 01/24/2023   [place] Bx, Venus Clip, Path pending   BREAST BIOPSY Left 01/24/2023   [place] LT BREAST BX W LOC DEV 1ST LESION IMG BX SPEC US GUIDE 01/24/2023 [facility]   BREAST BIOPSY  03/13/2023   MM LT RADIOACTIVE SEED LOC MAMMO GUIDE 03/13/2023 [facility]   BREAST EXCISIONAL BIOPSY Right 1960's   negative. No scar seen   CERVICAL FUSION  2005,2010   C2-C7   RADIOACTIVE SEED GUIDED EXCISIONAL BREAST BIOPSY Left 03/13/2023   Procedure: RADIOACTIVE SEED GUIDED EXCISIONAL LEFT BREAST BIOPSY;  Surgeon: Emelia Loron, MD;  Location: [facility];  Service: General;  Laterality: Left;   REDUCTION MAMMAPLASTY Bilateral 2016   TONSILLECTOMY  1961    Family Psychiatric History: I have reviewed family psychiatric history from progress note on 03/11/2018.  Family History:  Family History  Problem Relation Age of Onset   Ovarian cancer Mother 50   Anxiety disorder Mother    Depression Mother    Heart disease Father  Stroke Father    Diabetes Father    Lung cancer Maternal Uncle        d. 34; smoking hx   Cancer Maternal Grandmother 44       ovarian or uterine?   ADD / ADHD Daughter    ADD / ADHD Grandchild    Melanoma Cousin        mat female cousin; dx 75s; back   Cancer Cousin        mat female cousins x2; unknown primary w/ mets   Prostate cancer Neg Hx    Kidney cancer Neg Hx    Breast cancer Neg Hx    Colon cancer Neg Hx     Social History: I have reviewed social history from progress note on 03/11/2018. Social History   Socioeconomic History   Marital status: Married    Spouse name: jeff   Number of  children: 2   Years of education: Not on file   Highest education level: Master's degree (e.g., MA, MS, MEng, MEd, MSW, MBA)  Occupational History    Comment: retired  Tobacco Use   Smoking status: Never   Smokeless tobacco: Never  Vaping Use   Vaping status: Never Used  Substance and Sexual Activity   Alcohol use: Yes    Alcohol/week: 1.0 standard drink of alcohol    Types: 1 Glasses of wine per week    Comment: occasional to rare use   Drug use: No   Sexual activity: Yes    Birth control/protection: None  Other Topics Concern   Not on file  Social History Narrative   Married 2004   3 kids total (she has two, huband has one)   4 grandkids alive, 1 had passed away   Retired.     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   Social Determinants of Health   Financial Resource Strain: Low Risk  (01/30/2023)   Received [facility]   Overall Financial Resource Strain (CARDIA)    Difficulty of Paying Living Expenses: Not hard at all  Food Insecurity: No Food Insecurity (11/21/2017)   Hunger Vital Sign    Worried About Running Out of Food in the Last Year: Never true    Ran Out of Food in the Last Year: Never true  Transportation Needs: No Transportation Needs (01/31/2023)   [facility] - Transportation    Lack of Transportation (Medical): No    Lack of Transportation (Non-Medical): No  Physical Activity: Inactive (11/21/2017)   Exercise Vital Sign    Days of Exercise per Week: 0 days    Minutes of Exercise per Session: 0 min  Stress: Stress Concern Present (01/30/2023)   Received from [facility] - Occupational Stress Questionnaire    Feeling of Stress : To some extent  Social Connections: Unknown (01/30/2023)   Received from [facility]   Social Network    Social Network: Not on file    Allergies:  Allergies  Allergen Reactions   Codeine Hives   Iodinated  Contrast Media Itching   Metrizamide Itching   Latex    Vancomycin Itching    "Requests benadryl given with vancomycin"    Metabolic Disorder Labs: No results found for: "HGBA1C", "MPG" No results found for: "PROLACTIN" Lab Results  Component Value Date   CHOL 151 11/30/2022   TRIG 83.0 11/30/2022   HDL 68.20 11/30/2022   CHOLHDL 2  11/30/2022   VLDL 16.6 11/30/2022   LDLCALC 66 11/30/2022   LDLCALC 75 06/12/2022   Lab Results  Component Value Date   TSH 2.68 11/30/2022   TSH 2.140 10/10/2021    Therapeutic Level Labs: No results found for: "LITHIUM" No results found for: "VALPROATE" No results found for: "CBMZ"  Current Medications: Current Outpatient Medications  Medication Sig Dispense Refill   acyclovir (ZOVIRAX) 200 MG capsule Take 1 capsule (200 mg total) by mouth 3 (three) times daily as needed. 30 capsule 1   Biotin 5 MG CAPS Take by mouth.     Cholecalciferol (VITAMIN D3) 50 MCG (2000 UT) capsule Take by mouth.     Collagen-Vitamin C-Biotin (COLLAGEN 1500/C) 500-50-0.8 MG CAPS Take by mouth.     doxycycline (VIBRAMYCIN) 50 MG capsule Take 1 capsule (50 mg total) by mouth 2 (two) times daily. 180 capsule 3   ibuprofen (ADVIL,MOTRIN) 200 MG tablet Take 200 mg by mouth every 6 (six) hours as needed for moderate pain.     levothyroxine (SYNTHROID) 75 MCG tablet Take 1 tablet (75 mcg total) by mouth daily before breakfast. 90 tablet 3   Multiple Vitamins-Minerals (OCUVITE ADULT 50+ PO) Take by mouth.     Omega-3 Fatty Acids (FISH OIL) 1000 MG CAPS Take by mouth.     propranolol (INDERAL) 10 MG tablet Take 1 tablet (10 mg total) by mouth 3 (three) times daily as needed. 270 tablet 3   simvastatin (ZOCOR) 20 MG tablet Take 1 tablet (20 mg total) by mouth at bedtime. 90 tablet 3   solifenacin (VESICARE) 10 MG tablet Take 1 tablet (10 mg total) by mouth daily. 90 tablet 3   White Petrolatum-Mineral Oil (GENTEAL TEARS NIGHT-TIME) OINT Apply to eye.     mirtazapine  (REMERON) 7.5 MG tablet Take 1 tablet (7.5 mg total) by mouth at bedtime. 90 tablet 3   No current facility-administered medications for this visit.   Facility-Administered Medications Ordered in Other Visits  Medication Dose Route Frequency Provider Last Rate Last Admin   diphenhydrAMINE (BENADRYL) injection 25 mg  25 mg Intravenous Once Emelia Loron, MD         Musculoskeletal: Strength & Muscle Tone: within normal limits Gait & Station: normal Patient leans: N/A  Psychiatric Specialty Exam: Review of Systems  Psychiatric/Behavioral: Negative.      Blood pressure 115/71, pulse 62, temperature (!) 97.1 F (36.2 C), temperature source Skin, height 5\' 5"  (1.651 m), weight 156 lb (70.8 kg).Body mass index is 25.96 kg/m.  General Appearance: Fairly Groomed  Eye Contact:  Fair  Speech:  Clear and Coherent  Volume:  Normal  Mood:  Euthymic  Affect:  Restricted  Thought Process:  Goal Directed and Descriptions of Associations: Intact  Orientation:  Full (Time, Place, and Person)  Thought Content: Logical   Suicidal Thoughts:  No  Homicidal Thoughts:  No  Memory:  Immediate;   Fair Recent;   Fair Remote;   Fair  Judgement:  Fair  Insight:  Fair  Psychomotor Activity:  Normal  Concentration:  Concentration: Fair and Attention Span: Fair  Recall:  [employer] of Knowledge: Fair  Language: Fair  Akathisia:  No  Handed:  Right  AIMS (if indicated): done  Assets:  Communication Skills Desire for Improvement Housing Social Support  ADL's:  Intact  Cognition: WNL  Sleep:  Fair   Screenings: Geneticist, molecular Office Visit from 07/26/2023 in [facility]  AIMS Total  Score 0      GAD-7    Flowsheet Row Office Visit from 07/26/2023 in [facility] Office Visit from 02/08/2023 in [facility] Video Visit from 08/01/2022 in [facility] Video Visit from 02/02/2022 in [facility] Video Visit from 07/21/2021 in [facility]  Total GAD-7 Score 5 0 3 6 3       PHQ2-9    Flowsheet Row Office Visit from 07/26/2023 in [facility] Video Visit from 04/04/2023 in [facility] Office Visit from 02/08/2023 in [facility] Video Visit from 08/01/2022 in [facility] Video Visit from 02/02/2022 in [facility]  PHQ-2 Total Score 0 0 0 1 0  PHQ-9 Total Score -- -- 1 -- --      Flowsheet Row Office Visit from 07/26/2023 in [facility] Video Visit from 04/04/2023 in [facility] Admission (Discharged) from 03/13/2023 in [facility]  C-SSRS RISK CATEGORY No Risk No Risk Error: Question 6 not populated        Assessment and Plan: Emberli Linnehan is a 72 year old Caucasian female who is married, retired, has a history of PTSD, anxiety, currently stable on current medication regimen as well as maintenance psychotherapy, plan as noted below.  Plan GAD-stable Mirtazapine 7.5 mg p.o. nightly for sleep Propranolol 10 mg p.o. 3 times daily as needed Continue CBT with Ms. Felecia Jan.  PTSD-stable Mirtazapine 7.5 mg p.o. nightly for sleep Continue CBT  Follow-up in clinic in 6 months or sooner if needed.   Collaboration of Care: Collaboration of Care: Primary Care Provider AEB patient encouraged to follow up with primary care provider to get labs done.  Patient/Guardian was advised Release of Information must be obtained prior to any record release in order to collaborate their care with an outside provider. Patient/Guardian was advised if they have not already done so to contact  the registration department to sign all necessary forms in order for [place] to release information regarding their care.   Consent: Patient/Guardian gives verbal consent for treatment and assignment of benefits for services provided during this visit. Patient/Guardian expressed understanding and agreed to proceed.   This note was generated in part or whole with voice recognition software. Voice recognition is usually quite accurate but there are transcription errors that can and very often do occur. I apologize for any typographical errors that were not detected and corrected.    Jomarie Longs, MD 07/26/2023, 6:27 PM

## 2023-09-05 ENCOUNTER — Encounter: Payer: Self-pay | Admitting: Family Medicine

## 2023-09-05 ENCOUNTER — Ambulatory Visit (INDEPENDENT_AMBULATORY_CARE_PROVIDER_SITE_OTHER): Payer: Medicare Other | Admitting: Family Medicine

## 2023-09-05 VITALS — BP 110/60 | HR 69 | Temp 98.5°F | Ht 65.0 in | Wt 158.0 lb

## 2023-09-05 DIAGNOSIS — N3000 Acute cystitis without hematuria: Secondary | ICD-10-CM

## 2023-09-05 DIAGNOSIS — R350 Frequency of micturition: Secondary | ICD-10-CM | POA: Diagnosis not present

## 2023-09-05 LAB — POCT URINALYSIS DIPSTICK
Bilirubin, UA: NEGATIVE
Blood, UA: NEGATIVE
Glucose, UA: NEGATIVE
Nitrite, UA: NEGATIVE
Protein, UA: POSITIVE — AB
Spec Grav, UA: 1.025 (ref 1.010–1.025)
Urobilinogen, UA: 0.2 E.U./dL
pH, UA: 6 (ref 5.0–8.0)

## 2023-09-05 MED ORDER — CEPHALEXIN 500 MG PO CAPS
500.0000 mg | ORAL_CAPSULE | Freq: Three times a day (TID) | ORAL | 0 refills | Status: AC
Start: 2023-09-05 — End: 2023-09-10

## 2023-09-05 NOTE — Progress Notes (Signed)
Acute Office Visit   Subjective:  Patient ID: [name], female    DOB: 1951-06-25, 72 y.o.   MRN: [id number]  Chief Complaint  Patient presents with   Urinary Frequency    Pt reports yesterday started urine freq., urine burning. OTC AZO    HPI:  Patient is a 72 year old female that presents with increase in urine frequency and dysuria. Reports lower abd pain, lower back pain. She reports her temp last night was 99.2. Denies nausea, vomiting, or diarrhea.   Symptoms started yesterday.   Patient has been taking OTC Azo and Tylenol last night.   Review of Systems  Genitourinary:  Positive for frequency.  See HPI above     Objective:   BP 110/60   Pulse 69   Temp 98.5 F (36.9 C)   Ht 5\' 5"  (1.651 m)   Wt 158 lb (71.7 kg)   SpO2 95%   BMI 26.29 kg/m    Physical Exam Vitals reviewed.  Constitutional:      General: She is not in acute distress.    Appearance: Normal appearance. She is not ill-appearing, toxic-appearing or diaphoretic.  HENT:     Head: Normocephalic and atraumatic.  Eyes:     General:        Right eye: No discharge.        Left eye: No discharge.     Conjunctiva/sclera: Conjunctivae normal.  Cardiovascular:     Rate and Rhythm: Normal rate and regular rhythm.     Heart sounds: Normal heart sounds. No murmur heard.    No friction rub. No gallop.  Pulmonary:     Effort: Pulmonary effort is normal. No respiratory distress.     Breath sounds: Normal breath sounds.  Abdominal:     General: Bowel sounds are normal.     Palpations: Abdomen is soft.     Tenderness: There is no right CVA tenderness or left CVA tenderness.  Musculoskeletal:        General: Normal range of motion.  Skin:    General: Skin is warm and dry.  Neurological:     General: No focal deficit present.     Mental Status: She is alert and oriented to person, place, and time. Mental status is at baseline.  Psychiatric:        Mood and Affect: Mood normal.        Behavior:  Behavior normal.        Thought Content: Thought content normal.        Judgment: Judgment normal.    Results for orders placed or performed in visit on 09/05/23  POCT urinalysis dipstick  Result Value Ref Range   Color, UA Dark/cloudy    Clarity, UA     Glucose, UA Negative Negative   Bilirubin, UA neg    Ketones, UA 1+    Spec Grav, UA 1.025 1.010 - 1.025   Blood, UA neg    pH, UA 6.0 5.0 - 8.0   Protein, UA Positive (A) Negative   Urobilinogen, UA 0.2 0.2 or 1.0 E.U./dL   Nitrite, UA neg    Leukocytes, UA Small (1+) (A) Negative   Appearance     Odor         Assessment & Plan:  Urine frequency -     POCT urinalysis dipstick  Acute cystitis without hematuria -     Cephalexin; Take 1 capsule (500 mg total) by mouth 3 (three) times daily for 5 days.  Dispense: 15 capsule; Refill: 0 -     Urine Culture   -Urinalysis shows signs of a urinary tract infection. Will send urine for culture. Will start treatment for a UTI.  -START Keflex 500mg  tablet, 1 tablet every 8 hours for the next 5 days.  -Recommend to hydrate with water. -Follow up with PCP if not improved. If you develop nausea, vomiting, severe abd pain, or fever while taking antibiotic, please go to the closes emergency department.   Zandra Abts, NP

## 2023-09-05 NOTE — Patient Instructions (Addendum)
-  Urinalysis shows signs of a urinary tract infection. Will send urine for culture. Since you are having symptoms, recommend to start treatment.  -START Keflex 500mg  tablet, 1 tablet every 8 hours for the next 5 days.  -Recommend to hydrate with water. -Follow up with PCP if not improved. If you develop nausea, vomiting, severe abd pain, or fever while taking antibiotic, please go to the closes emergency department.

## 2023-09-06 LAB — URINE CULTURE
MICRO NUMBER:: 15515428
Result:: NO GROWTH
SPECIMEN QUALITY:: ADEQUATE

## 2023-09-07 ENCOUNTER — Telehealth: Payer: Self-pay

## 2023-09-07 NOTE — Telephone Encounter (Signed)
-----   Message from Zandra Abts sent at 09/07/2023  9:01 AM EDT ----- Your urine culture came back with no growth. You do not need to finish antibiotics. If you still have symptoms, please follow up with PCP.

## 2023-10-18 ENCOUNTER — Encounter: Payer: Self-pay | Admitting: Dermatology

## 2023-10-18 ENCOUNTER — Ambulatory Visit: Payer: Medicare Other | Admitting: Dermatology

## 2023-10-18 DIAGNOSIS — Z85828 Personal history of other malignant neoplasm of skin: Secondary | ICD-10-CM

## 2023-10-18 DIAGNOSIS — W908XXA Exposure to other nonionizing radiation, initial encounter: Secondary | ICD-10-CM

## 2023-10-18 DIAGNOSIS — L821 Other seborrheic keratosis: Secondary | ICD-10-CM

## 2023-10-18 DIAGNOSIS — L820 Inflamed seborrheic keratosis: Secondary | ICD-10-CM

## 2023-10-18 DIAGNOSIS — L578 Other skin changes due to chronic exposure to nonionizing radiation: Secondary | ICD-10-CM | POA: Diagnosis not present

## 2023-10-18 DIAGNOSIS — D229 Melanocytic nevi, unspecified: Secondary | ICD-10-CM

## 2023-10-18 DIAGNOSIS — D1801 Hemangioma of skin and subcutaneous tissue: Secondary | ICD-10-CM

## 2023-10-18 DIAGNOSIS — Z1283 Encounter for screening for malignant neoplasm of skin: Secondary | ICD-10-CM

## 2023-10-18 DIAGNOSIS — L814 Other melanin hyperpigmentation: Secondary | ICD-10-CM | POA: Diagnosis not present

## 2023-10-18 DIAGNOSIS — L570 Actinic keratosis: Secondary | ICD-10-CM | POA: Diagnosis not present

## 2023-10-18 NOTE — Patient Instructions (Addendum)

## 2023-10-18 NOTE — Progress Notes (Signed)
Follow-Up Visit   Subjective  [name] is a 72 y.o. female who presents for the following: Skin Cancer Screening and Full Body Skin Exam  The patient presents for Total-Body Skin Exam (TBSE) for skin cancer screening and mole check. The patient has spots, moles and lesions to be evaluated, some may be new or changing and the patient may have concern these could be cancer.  Patient with hx of BCC. Patient does have a spot at right shoulder that gets irritated with clothing.   The following portions of the chart were reviewed this encounter and updated as appropriate: medications, allergies, medical history  Review of Systems:  No other skin or systemic complaints except as noted in HPI or Assessment and Plan.  Objective  Well appearing patient in no apparent distress; mood and affect are within normal limits.  A full examination was performed including scalp, head, eyes, ears, nose, lips, neck, chest, axillae, abdomen, back, buttocks, bilateral upper extremities, bilateral lower extremities, hands, feet, fingers, toes, fingernails, and toenails. All findings within normal limits unless otherwise noted below.   Relevant physical exam findings are noted in the Assessment and Plan.  Right Forehead Erythematous thin papules/macules with gritty scale.   top of R shoulder x 1, forehead x 6, L side x 3 (10) Erythematous stuck-on, waxy papule or plaque    Assessment & Plan   SKIN CANCER SCREENING PERFORMED TODAY.  ACTINIC DAMAGE - Chronic condition, secondary to cumulative UV/sun exposure - diffuse scaly erythematous macules with underlying dyspigmentation - Recommend daily broad spectrum sunscreen SPF 30+ to sun-exposed areas, reapply every 2 hours as needed.  - Staying in the shade or wearing long sleeves, sun glasses (UVA+UVB protection) and wide brim hats (4-inch brim around the entire circumference of the hat) are also recommended for sun protection.  - Call for new or  changing lesions.  LENTIGINES, SEBORRHEIC KERATOSES, HEMANGIOMAS - Benign normal skin lesions - Benign-appearing - Call for any changes  MELANOCYTIC NEVI - Tan-brown and/or pink-flesh-colored symmetric macules and papules - Benign appearing on exam today - Observation - Call clinic for new or changing moles - Recommend daily use of broad spectrum spf 30+ sunscreen to sun-exposed areas.   HISTORY OF BASAL CELL CARCINOMA OF THE SKIN - No evidence of recurrence today at left thigh, 1990 - Recommend regular full body skin exams - Recommend daily broad spectrum sunscreen SPF 30+ to sun-exposed areas, reapply every 2 hours as needed.  - Call if any new or changing lesions are noted between office visits     AK (actinic keratosis) Right Forehead  Actinic keratoses are precancerous spots that appear secondary to cumulative UV radiation exposure/sun exposure over time. They are chronic with expected duration over 1 year. A portion of actinic keratoses will progress to squamous cell carcinoma of the skin. It is not possible to reliably predict which spots will progress to skin cancer and so treatment is recommended to prevent development of skin cancer.  Recommend daily broad spectrum sunscreen SPF 30+ to sun-exposed areas, reapply every 2 hours as needed.  Recommend staying in the shade or wearing long sleeves, sun glasses (UVA+UVB protection) and wide brim hats (4-inch brim around the entire circumference of the hat). Call for new or changing lesions.   Destruction of lesion - Right Forehead Complexity: simple   Destruction method: cryotherapy   Informed consent: discussed and consent obtained   Timeout:  patient name, date of birth, surgical site, and procedure verified Lesion destroyed using  liquid nitrogen: Yes   Region frozen until ice ball extended beyond lesion: Yes   Outcome: patient tolerated procedure well with no complications   Post-procedure details: wound care  instructions given    Inflamed seborrheic keratosis (10) top of R shoulder x 1, forehead x 6, L side x 3  Symptomatic, irritating, patient would like treated.  Benign-appearing.  Call clinic for new or changing lesions.    Destruction of lesion - top of R shoulder x 1, forehead x 6, L side x 3 (10)  Destruction method: cryotherapy   Informed consent: discussed and consent obtained   Lesion destroyed using liquid nitrogen: Yes   Cryotherapy cycles:  2 Outcome: patient tolerated procedure well with no complications   Post-procedure details: wound care instructions given     Return in about 1 year (around 10/17/2024) for TBSE, with Dr. Kirtland Bouchard, Hx BCC, Hx AK.  Anise Salvo, RMA, am acting as scribe for Armida Sans, MD .   Documentation: I have reviewed the above documentation for accuracy and completeness, and I agree with the above.  Armida Sans, MD

## 2023-10-22 ENCOUNTER — Other Ambulatory Visit: Payer: Self-pay

## 2023-10-22 MED ORDER — LEVOTHYROXINE SODIUM 75 MCG PO TABS: 75.0000 ug | ORAL_TABLET | Freq: Every day | ORAL | 0 refills | Status: DC

## 2023-10-30 ENCOUNTER — Ambulatory Visit (INDEPENDENT_AMBULATORY_CARE_PROVIDER_SITE_OTHER): Payer: Self-pay | Admitting: Dermatology

## 2023-10-30 ENCOUNTER — Encounter: Payer: Self-pay | Admitting: Dermatology

## 2023-10-30 DIAGNOSIS — L988 Other specified disorders of the skin and subcutaneous tissue: Secondary | ICD-10-CM

## 2023-10-30 NOTE — Progress Notes (Signed)
   Follow-Up Visit   Subjective  Paola Karan is a 72 y.o. female who presents for the following: Botox for facial elastosis  The following portions of the chart were reviewed this encounter and updated as appropriate: medications, allergies, medical history  Review of Systems:  No other skin or systemic complaints except as noted in HPI or Assessment and Plan.  Objective  Well appearing patient in no apparent distress; mood and affect are within normal limits.  A focused examination was performed of the face.  Relevant physical exam findings are noted in the Assessment and Plan.  Injection map photo     Assessment & Plan    Facial Elastosis  Discussed plastic surgery, information given. Dr. [facility]  [phone] Dr. Susann Givens [phone] Dr. Miguel Dibble [facility] [phone]  Botox 20 units injected to: - Frown complex 20 units  Location: frown complex  Informed consent: Discussed risks (infection, pain, bleeding, bruising, swelling, allergic reaction, paralysis of nearby muscles, eyelid droop, double vision, neck weakness, difficulty breathing, headache, undesirable cosmetic result, and need for additional treatment) and benefits of the procedure, as well as the alternatives.  Informed consent was obtained.  Preparation: The area was cleansed with alcohol.  Procedure Details:  Botox was injected into the dermis with a 30-gauge needle. Pressure applied to any bleeding. Ice packs offered for swelling.  Lot Number:  [id number] Expiration:  08/2025  Total Units Injected:  20  Plan: Tylenol may be used for headache.  Allow 2 weeks before returning to clinic for additional dosing as needed. Patient will call for any problems.  Return for 3-68m Botox.  I, Ardis Rowan, RMA, am acting as scribe for Armida Sans, MD .   Documentation: I have reviewed the above documentation for accuracy and completeness, and I agree with the  above.  Armida Sans, MD

## 2023-10-30 NOTE — Patient Instructions (Addendum)
Dr. Eliseo [facility]  [phone] Dr. Susann Givens [phone] Dr. Miguel Dibble [place] [phone]   Due to recent changes in healthcare laws, you may see results of your pathology and/or laboratory studies on MyChart before the doctors have had a chance to review them. We understand that in some cases there may be results that are confusing or concerning to you. Please understand that not all results are received at the same time and often the doctors may need to interpret multiple results in order to provide you with the best plan of care or course of treatment. Therefore, we ask that you please give [place] 2 business days to thoroughly review all your results before contacting the office for clarification. Should we see a critical lab result, you will be contacted sooner.   If You Need Anything After Your Visit  If you have any questions or concerns for your doctor, please call our main line at [phone] and press option 4 to reach your doctor's medical assistant. If no one answers, please leave a voicemail as directed and we will return your call as soon as possible. Messages left after 4 pm will be answered the following business day.   You may also send [place] a message via MyChart. We typically respond to MyChart messages within 1-2 business days.  For prescription refills, please ask your pharmacy to contact our office. Our fax number is [phone].  If you have an urgent issue when the clinic is closed that cannot wait until the next business day, you can page your doctor at the number below.    Please note that while we do our best to be available for urgent issues outside of office hours, we are not available 24/7.   If you have an urgent issue and are unable to reach [place], you may choose to seek medical care at your doctor's office, retail clinic, urgent care center, or emergency room.  If you have a medical emergency, please immediately call 911 or go to the  emergency department.  Pager Numbers  - Dr. Gwen Pounds: [phone]  - Dr. Roseanne Reno: [phone]  - Dr. Katrinka Blazing: [phone]   In the event of inclement weather, please call our main line at [phone] for an update on the status of any delays or closures.  Dermatology Medication Tips: Please keep the boxes that topical medications come in in order to help keep track of the instructions about where and how to use these. Pharmacies typically print the medication instructions only on the boxes and not directly on the medication tubes.   If your medication is too expensive, please contact our office at [phone] option 4 or send [place] a message through MyChart.   We are unable to tell what your co-pay for medications will be in advance as this is different depending on your insurance coverage. However, we may be able to find a substitute medication at lower cost or fill out paperwork to get insurance to cover a needed medication.   If a prior authorization is required to get your medication covered by your insurance company, please allow [place] 1-2 business days to complete this process.  Drug prices often vary depending on where the prescription is filled and some pharmacies may offer cheaper prices.  The website www.goodrx.com contains coupons for medications through different pharmacies. The prices here do not account for what the cost may be with help from insurance (it may be cheaper with your insurance), but the website can give you the  price if you did not use any insurance.  - You can print the associated coupon and take it with your prescription to the pharmacy.  - You may also stop by our office during regular business hours and pick up a GoodRx coupon card.  - If you need your prescription sent electronically to a different pharmacy, notify our office through [facility] or by phone at [phone] option 4.     Si Usted Necesita Algo Despus de Su Visita  Tambin puede  enviarnos un mensaje a travs de Clinical cytogeneticist. Por lo general respondemos a los mensajes de MyChart en el transcurso de 1 a 2 das hbiles.  Para renovar recetas, por favor pida a su farmacia que se ponga en contacto con nuestra oficina. Annie Sable de fax es [place] [phone].  Si tiene un asunto urgente cuando la clnica est cerrada y que no puede esperar hasta el siguiente da hbil, puede llamar/localizar a su doctor(a) al nmero que aparece a continuacin.   Por favor, tenga en cuenta que aunque hacemos todo lo posible para estar disponibles para asuntos urgentes fuera del horario de [place], no estamos disponibles las 24 horas del da, los 7 [address]  [address] de la [place].   Si tiene un problema urgente y no puede comunicarse con nosotros, puede optar por buscar atencin mdica  en el consultorio de su doctor(a), en una clnica privada, en un centro de atencin urgente o en una sala de emergencias.  Si tiene Engineer, drilling, por favor llame inmediatamente al 911 o vaya a la sala de emergencias.  Nmeros de bper  - Dr. Gwen Pounds: [phone]  - Dra. Roseanne Reno: [phone]  - Dr. Katrinka Blazing: [phone]   En caso de inclemencias del tiempo, por favor llame a Lacy Duverney principal al [phone] para una actualizacin sobre el [address] [place] de cualquier retraso o cierre.  Consejos para la medicacin en dermatologa: Por favor, guarde las cajas en las que vienen los medicamentos de uso tpico para ayudarle a seguir las instrucciones sobre dnde y cmo usarlos. Las farmacias generalmente imprimen las instrucciones del medicamento slo en las cajas y no directamente en los tubos del [place].   Si su medicamento es muy caro, por favor, pngase en contacto con Rolm Gala llamando al [phone] y presione la opcin 4 o envenos un mensaje a travs de Clinical cytogeneticist.   No podemos decirle cul ser su copago por los medicamentos por adelantado ya que esto es diferente dependiendo de la cobertura de su  seguro. Sin embargo, es posible que podamos encontrar un medicamento sustituto a Audiological scientist un formulario para que el seguro cubra el medicamento que se considera necesario.   Si se requiere una autorizacin previa para que su compaa de seguros [place] su medicamento, por favor permtanos de 1 a 2 das hbiles para completar [address]  Los precios de los medicamentos varan con frecuencia dependiendo del Environmental consultant de dnde se surte la receta y alguna farmacias pueden ofrecer precios ms baratos.  El sitio web www.goodrx.com tiene cupones para medicamentos de Health and safety inspector. Los precios aqu no tienen en cuenta lo que podra costar con la ayuda del seguro (puede ser ms barato con su seguro), pero el sitio web puede darle el precio si no utiliz Tourist information centre manager.  - Puede imprimir el cupn correspondiente y llevarlo con su receta a la farmacia.  - Tambin puede pasar por nuestra oficina durante el horario de atencin regular y Education officer, museum una tarjeta de cupones de GoodRx.  - Si necesita  que su receta se enve electrnicamente a Psychiatrist, informe a nuestra oficina a travs de MyChart de [place] o por telfono llamando al [phone] y presione la opcin 4.

## 2023-12-06 ENCOUNTER — Other Ambulatory Visit (INDEPENDENT_AMBULATORY_CARE_PROVIDER_SITE_OTHER): Payer: Medicare Other

## 2023-12-06 ENCOUNTER — Other Ambulatory Visit: Payer: Self-pay | Admitting: Primary Care

## 2023-12-06 DIAGNOSIS — E039 Hypothyroidism, unspecified: Secondary | ICD-10-CM

## 2023-12-06 DIAGNOSIS — E785 Hyperlipidemia, unspecified: Secondary | ICD-10-CM | POA: Diagnosis not present

## 2023-12-07 LAB — COMPREHENSIVE METABOLIC PANEL
ALT: 24 [IU]/L (ref 0–32)
AST: 26 [IU]/L (ref 0–40)
Albumin: 4.1 g/dL (ref 3.9–4.9)
Alkaline Phosphatase: 90 [IU]/L (ref 44–121)
BUN/Creatinine Ratio: 21 (ref 12–28)
BUN: 17 mg/dL (ref 8–27)
Bilirubin Total: 0.3 mg/dL (ref 0.0–1.2)
CO2: 25 mmol/L (ref 20–29)
Calcium: 9.4 mg/dL (ref 8.7–10.3)
Chloride: 105 mmol/L (ref 96–106)
Creatinine, Ser: 0.82 mg/dL (ref 0.57–1.00)
Globulin, Total: 1.9 g/dL (ref 1.5–4.5)
Glucose: 96 mg/dL (ref 70–99)
Potassium: 4.3 mmol/L (ref 3.5–5.2)
Sodium: 143 mmol/L (ref 134–144)
Total Protein: 6 g/dL (ref 6.0–8.5)
eGFR: 77 mL/min/{1.73_m2} (ref >59)

## 2023-12-07 LAB — LIPID PANEL
Chol/HDL Ratio: 2.5 {ratio} (ref 0.0–4.4)
Cholesterol, Total: 177 mg/dL (ref 100–199)
HDL: 71 mg/dL (ref >39)
LDL Chol Calc (NIH): 92 mg/dL (ref 0–99)
Triglycerides: 73 mg/dL (ref 0–149)
VLDL Cholesterol Cal: 14 mg/dL (ref 5–40)

## 2023-12-07 LAB — CBC WITH DIFFERENTIAL/PLATELET
Basophils Absolute: 0 10*3/uL (ref 0.0–0.2)
Basos: 1 %
EOS (ABSOLUTE): 0.1 10*3/uL (ref 0.0–0.4)
Eos: 1 %
Hematocrit: 45.7 % (ref 34.0–46.6)
Hemoglobin: 14.9 g/dL (ref 11.1–15.9)
Immature Grans (Abs): 0 10*3/uL (ref 0.0–0.1)
Immature Granulocytes: 0 %
Lymphocytes Absolute: 2.6 10*3/uL (ref 0.7–3.1)
Lymphs: 45 %
MCH: 30.8 pg (ref 26.6–33.0)
MCHC: 32.6 g/dL (ref 31.5–35.7)
MCV: 95 fL (ref 79–97)
Monocytes Absolute: 0.4 10*3/uL (ref 0.1–0.9)
Monocytes: 7 %
Neutrophils Absolute: 2.7 10*3/uL (ref 1.4–7.0)
Neutrophils: 46 %
Platelets: 176 10*3/uL (ref 150–450)
RBC: 4.83 x10E6/uL (ref 3.77–5.28)
RDW: 12.2 % (ref 11.7–15.4)
WBC: 5.7 10*3/uL (ref 3.4–10.8)

## 2023-12-07 LAB — TSH: TSH: 1.96 u[IU]/mL (ref 0.450–4.500)

## 2023-12-10 ENCOUNTER — Other Ambulatory Visit: Payer: Medicare Other

## 2023-12-14 ENCOUNTER — Ambulatory Visit (INDEPENDENT_AMBULATORY_CARE_PROVIDER_SITE_OTHER): Payer: Medicare Other

## 2023-12-14 VITALS — Ht 65.0 in | Wt 155.0 lb

## 2023-12-14 DIAGNOSIS — Z1231 Encounter for screening mammogram for malignant neoplasm of breast: Secondary | ICD-10-CM

## 2023-12-14 DIAGNOSIS — Z0000 Encounter for general adult medical examination without abnormal findings: Secondary | ICD-10-CM

## 2023-12-14 NOTE — Progress Notes (Signed)
 Subjective:   [name] is a 73 y.o. female who presents for an Initial Medicare Annual Wellness Visit.  Visit Complete: Virtual I connected with  Aara Falck on 12/14/23 by a audio enabled telemedicine application and verified that I am speaking with the correct person using two identifiers.  Patient Location: Home  Provider Location: Home Office  I discussed the limitations of evaluation and management by telemedicine. The patient expressed understanding and agreed to proceed.  Vital Signs: Because this visit was a virtual/telehealth visit, some criteria may be missing or patient reported. Any vitals not documented were not able to be obtained and vitals that have been documented are patient reported.  Patient Medicare AWV questionnaire was completed by the patient on 12/10/23; I have confirmed that all information answered by patient is correct and no changes since this date.  Cardiac Risk Factors include: advanced age (>58men, >23 women);hypertension     Objective:    Today's Vitals   12/14/23 1525  Weight: 155 lb (70.3 kg)  Height: 5' 5 (1.651 m)  PainSc: 0-No pain   Body mass index is 25.79 kg/m.     12/14/2023    3:35 PM 03/13/2023   11:20 AM 03/02/2023    1:49 PM 03/08/2017   12:34 PM  Advanced Directives  Does Patient Have a Medical Advance Directive? Yes Yes Yes No  Type of Estate Agent of [place];Living will Healthcare Power of [place];Living will Healthcare Power of [place];Living will   Does patient want to make changes to medical advance directive?  No - Patient declined No - Patient declined   Copy of Healthcare Power of Attorney in Chart? No - copy requested No - copy requested No - copy requested   Would patient like information on creating a medical advance directive?    No - Patient declined    Current Medications (verified) Outpatient Encounter Medications as of 12/14/2023  Medication Sig   acyclovir  (ZOVIRAX ) 200 MG capsule  Take 1 capsule (200 mg total) by mouth 3 (three) times daily as needed.   Biotin 5 MG CAPS Take by mouth.   Cholecalciferol (VITAMIN D3) 50 MCG (2000 UT) capsule Take by mouth.   Collagen-Vitamin C-Biotin (COLLAGEN 1500/C) 500-50-0.8 MG CAPS Take by mouth.   doxycycline  (VIBRAMYCIN ) 50 MG capsule Take 1 capsule (50 mg total) by mouth 2 (two) times daily.   ibuprofen  (ADVIL ,MOTRIN ) 200 MG tablet Take 200 mg by mouth every 6 (six) hours as needed for moderate pain.   levothyroxine  (SYNTHROID ) 75 MCG tablet Take 1 tablet (75 mcg total) by mouth daily before breakfast.   mirtazapine  (REMERON ) 7.5 MG tablet Take 1 tablet (7.5 mg total) by mouth at bedtime.   Multiple Vitamins-Minerals (OCUVITE ADULT 50+ PO) Take by mouth.   Omega-3 Fatty Acids (FISH OIL) 1000 MG CAPS Take by mouth.   propranolol  (INDERAL ) 10 MG tablet Take 1 tablet (10 mg total) by mouth 3 (three) times daily as needed.   simvastatin  (ZOCOR ) 20 MG tablet Take 1 tablet (20 mg total) by mouth at bedtime.   solifenacin  (VESICARE ) 10 MG tablet Take 1 tablet (10 mg total) by mouth daily.   White Petrolatum-Mineral Oil (GENTEAL TEARS NIGHT-TIME) OINT Apply to eye.   Facility-Administered Encounter Medications as of 12/14/2023  Medication   diphenhydrAMINE  (BENADRYL ) injection 25 mg    Allergies (verified) Codeine, Iodinated contrast media, Metrizamide, Latex, and Vancomycin    History: Past Medical History:  Diagnosis Date   Anxiety    Basal cell carcinoma  L thigh, txted ~1990 in [place]    Rosacea    ocular, on chronic doxycycline  treatment   Thyroid  disease    Past Surgical History:  Procedure Laterality Date   ABDOMINAL HYSTERECTOMY  1992   Total   BREAST BIOPSY Right 07/20/2015   neg fat necrosis   BREAST BIOPSY Left 01/24/2023   US  Bx, Venus Clip, Path pending   BREAST BIOPSY Left 01/24/2023   US  LT BREAST BX W LOC DEV 1ST LESION IMG BX SPEC US  GUIDE 01/24/2023 [facility]   BREAST BIOPSY  03/13/2023   MM LT  RADIOACTIVE SEED LOC MAMMO GUIDE 03/13/2023 [facility]   BREAST EXCISIONAL BIOPSY Right 1960's   negative. No scar seen   CERVICAL FUSION  2005,2010   C2-C7   RADIOACTIVE SEED GUIDED EXCISIONAL BREAST BIOPSY Left 03/13/2023   Procedure: RADIOACTIVE SEED GUIDED EXCISIONAL LEFT BREAST BIOPSY;  Surgeon: Ebbie Cough, MD;  Location: [facility];  Service: General;  Laterality: Left;   REDUCTION MAMMAPLASTY Bilateral 2016   TONSILLECTOMY  1961   Family History  Problem Relation Age of Onset   Ovarian cancer Mother 3   Anxiety disorder Mother    Depression Mother    Heart disease Father    Stroke Father    Diabetes Father    Lung cancer Maternal Uncle        d. 53; smoking hx   Cancer Maternal Grandmother 61       ovarian or uterine?   ADD / ADHD Daughter    ADD / ADHD Grandchild    Melanoma Cousin        mat female cousin; dx 15s; back   Cancer Cousin        mat female cousins x2; unknown primary w/ mets   Prostate cancer Neg Hx    Kidney cancer Neg Hx    Breast cancer Neg Hx    Colon cancer Neg Hx    Social History   Socioeconomic History   Marital status: Married    Spouse name: jeff   Number of children: 2   Years of education: Not on file   Highest education level: Master's degree (e.g., MA, MS, MEng, MEd, MSW, MBA)  Occupational History    Comment: retired  Tobacco Use   Smoking status: Never   Smokeless tobacco: Never  Vaping Use   Vaping status: Never Used  Substance and Sexual Activity   Alcohol use: Yes    Alcohol/week: 1.0 standard drink of alcohol    Types: 1 Glasses of wine per week    Comment: occasional to rare use   Drug use: No   Sexual activity: Yes    Birth control/protection: None  Other Topics Concern   Not on file  Social History Narrative   Married 2004   3 kids total (she has two, huband has one)   4 grandkids alive, 1 had passed away   Retired.     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   Social Drivers of Corporate Investment Banker Strain: Low Risk  (12/14/2023)   Overall Financial Resource Strain (CARDIA)    Difficulty of Paying Living Expenses: Not hard at all  Food Insecurity: No Food Insecurity (12/14/2023)   Hunger Vital Sign    Worried About Running Out of Food in the Last Year: Never true    Ran Out of Food in the Last Year: Never true  Transportation Needs: No Transportation Needs (12/14/2023)  PRAPARE - Administrator, Civil Service (Medical): No    Lack of Transportation (Non-Medical): No  Physical Activity: Sufficiently Active (12/14/2023)   Exercise Vital Sign    Days of Exercise per Week: 5 days    Minutes of Exercise per Session: 30 min  Stress: No Stress Concern Present (12/14/2023)   [employer] of Occupational Health - Occupational Stress Questionnaire    Feeling of Stress : Not at all  Social Connections: Moderately Integrated (12/14/2023)   Social Connection and Isolation Panel [NHANES]    Frequency of Communication with Friends and Family: More than three times a week    Frequency of Social Gatherings with Friends and Family: More than three times a week    Attends Religious Services: More than 4 times per year    Active Member of [employer] or Organizations: No    Attends Engineer, Structural: Never    Marital Status: Married    Tobacco Counseling Counseling given: Not Answered  Clinical Intake:  Pre-visit preparation completed: Yes  Pain : No/denies pain Pain Score: 0-No pain   BMI - recorded: 25.79 Nutritional Status: BMI 25 -29 Overweight Nutritional Risks: None Diabetes: No  How often do you need to have someone help you when you read instructions, pamphlets, or other written materials from your doctor or pharmacy?: 1 - Never  Interpreter Needed?: No  Comments: lives with husband Information entered by :: B.Traevion Poehler,LPN  Activities of Daily Living    12/10/2023   10:11 AM 03/13/2023   11:25 AM  In  your present state of health, do you have any difficulty performing the following activities:  Hearing? 0 0  Vision? 0 0  Difficulty concentrating or making decisions? 0 0  Walking or climbing stairs? 0 0  Dressing or bathing? 0 0  Doing errands, shopping? 0   Preparing Food and eating ? N   Using the Toilet? N   In the past six months, have you accidently leaked urine? N   Do you have problems with loss of bowel control? N   Managing your Medications? N   Managing your Finances? N   Housekeeping or managing your Housekeeping? N     Patient Care Team: Cleatus Arlyss RAMAN, MD as PCP - General (Family Medicine) Center, [facility]  Indicate any recent Medical Services you may have received from other than [facility] providers in the past year (date may be approximate).     Assessment:   This is a routine wellness examination for Goldie.  Hearing/Vision screen Hearing Screening - Comments:: Pt says her hearing is good Vision Screening - Comments:: Pt says her vision w/specialty contacts for day; glasses by night [facility]   Goals Addressed             This Visit's Progress    Patient Stated       I just want to stay active and minimize the impact of aging       Depression Screen    12/14/2023    3:31 PM 07/26/2023   12:16 PM 04/04/2023    5:41 PM 02/08/2023   10:34 AM 08/01/2022    2:03 PM 02/02/2022    2:17 PM 07/21/2021    2:08 PM  PHQ 2/9 Scores  PHQ - 2 Score 0   0     PHQ- 9 Score    1        Information is confidential and restricted. Go to Review Flowsheets to unlock data.  Fall Risk    12/10/2023   10:11 AM 09/05/2023    2:13 PM 02/08/2023   10:34 AM 12/15/2022   10:14 AM 11/07/2021    2:08 PM  Fall Risk   Falls in the past year? 0 1 1 1  0  Number falls in past yr:  0 0 0 0  Injury with Fall?  0 1 1 0  Risk for fall due to : No Fall Risks No Fall Risks No Fall Risks History of fall(s) No Fall Risks  Follow up Education provided;Falls prevention  discussed Falls evaluation completed Falls evaluation completed Falls evaluation completed Falls evaluation completed    MEDICARE RISK AT HOME: Medicare Risk at Home Any stairs in or around the home?: (Patient-Rptd) Yes If so, are there any without handrails?: (Patient-Rptd) No Home free of loose throw rugs in walkways, pet beds, electrical cords, etc?: (Patient-Rptd) Yes Adequate lighting in your home to reduce risk of falls?: (Patient-Rptd) Yes Life alert?: (Patient-Rptd) No Use of a cane, walker or w/c?: (Patient-Rptd) No Grab bars in the bathroom?: (Patient-Rptd) No Shower chair or bench in shower?: (Patient-Rptd) Yes Elevated toilet seat or a handicapped toilet?: (Patient-Rptd) No  TIMED UP AND GO:  Was the test performed? No    Cognitive Function:      12/14/2023    3:39 PM  6CIT Screen  What Year? 0 points  What month? 0 points  What time? 0 points  Count back from 20 0 points  Months in reverse 0 points  Repeat phrase 0 points  Total Score 0 points    Immunizations Immunization History  Administered Date(s) Administered   Influenza Inj Mdck Quad Pf 09/10/2019   Influenza, High Dose Seasonal PF 09/06/2022   Influenza,inj,Quad PF,6+ Mos 09/10/2020   Influenza-Unspecified 10/10/2021   PFIZER(Purple Top)SARS-COV-2 Vaccination 01/19/2020, 02/13/2020, 09/10/2020, 03/15/2021   Pfizer Covid-19 Vaccine Bivalent Booster 42yrs & up 10/10/2021, 04/10/2022   Pfizer(Comirnaty)Fall Seasonal Vaccine 12 years and older 09/06/2022   Pneumococcal Conjugate-13 10/30/2019   Pneumococcal Polysaccharide-23 11/02/2020, 09/06/2022   Rsv, Bivalent, Protein Subunit Rsvpref,pf [place]) 11/30/2022   Tdap 10/09/2016   Zoster Recombinant(Shingrix) 10/30/2019, 12/31/2019   Zoster, Live 11/15/2016    TDAP status: Up to date  Flu Vaccine status: Due, Education has been provided regarding the importance of this vaccine. Advised may receive this vaccine at local pharmacy or Health Dept.  Aware to provide a copy of the vaccination record if obtained from local pharmacy or Health Dept. Verbalized acceptance and understanding.  Pneumococcal vaccine status: Up to date  Covid-19 vaccine status: Completed vaccines  Qualifies for Shingles Vaccine? Yes   Zostavax completed Yes   Shingrix Completed?: Yes  Screening Tests Health Maintenance  Topic Date Due   COVID-19 Vaccine (8 - 2024-25 season) 02/08/2024 (Originally 08/12/2023)   INFLUENZA VACCINE  03/10/2024 (Originally 07/12/2023)   Medicare Annual Wellness (AWV)  12/13/2024   MAMMOGRAM  01/12/2025   Colonoscopy  06/06/2026   DTaP/Tdap/Td (2 - Td or Tdap) 10/09/2026   Pneumonia Vaccine 74+ Years old  Completed   DEXA SCAN  Completed   Hepatitis C Screening  Completed   Zoster Vaccines- Shingrix  Completed   HPV VACCINES  Aged Out    Health Maintenance  There are no preventive care reminders to display for this patient.  Colorectal cancer screening: Type of screening: Colonoscopy. Completed 06/06/2016. Repeat every 5-10 years  Mammogram status: Ordered yes. Pt provided with contact info and advised to call to schedule appt.   Bone Density  status: Completed 01/11/2022. Results reflect: Bone density results: NORMAL. Repeat every 5 years.  Lung Cancer Screening: (Low Dose CT Chest recommended if Age 21-80 years, 20 pack-year currently smoking OR have quit w/in 15years.) does not qualify.   Lung Cancer Screening Referral: no  Additional Screening:  Hepatitis C Screening: does not qualify; Completed 10/09/2016  Vision Screening: Recommended annual ophthalmology exams for early detection of glaucoma and other disorders of the eye. Is the patient up to date with their annual eye exam?  Yes  Who is the provider or what is the name of the office in which the patient attends annual eye exams? [facility] If pt is not established with a provider, would they like to be referred to a provider to establish care? No .   Dental  Screening: Recommended annual dental exams for proper oral hygiene  Diabetic Foot Exam: n/a  Community Resource Referral / Chronic Care Management: CRR required this visit?  No   CCM required this visit?  No     Plan:     I have personally reviewed and noted the following in the patient's chart:   Medical and social history Use of alcohol, tobacco or illicit drugs  Current medications and supplements including opioid prescriptions. Patient is not currently taking opioid prescriptions. Functional ability and status Nutritional status Physical activity Advanced directives List of other physicians Hospitalizations, surgeries, and ER visits in previous 12 months Vitals Screenings to include cognitive, depression, and falls Referrals and appointments  In addition, I have reviewed and discussed with patient certain preventive protocols, quality metrics, and best practice recommendations. A written personalized care plan for preventive services as well as general preventive health recommendations were provided to patient.     Erminio LITTIE Saris, LPN   07/16/7973   After Visit Summary: (MyChart) Due to this being a telephonic visit, the after visit summary with patients personalized plan was offered to patient via MyChart   Nurse Notes: The patient states she is doing well and has no concerns or questions at this time.

## 2023-12-14 NOTE — Patient Instructions (Addendum)
 [name] , Thank you for taking time to come for your Medicare Wellness Visit. I appreciate your ongoing commitment to your health goals. Please review the following plan we discussed and let me know if I can assist you in the future.   Referrals/Orders/Follow-Ups/Clinician Recommendations:   You have an order for:  []   2D Mammogram  [x]   3D Mammogram  []   Bone Density     Please call for appointment:  [facility]  [address] [place] [address] [phone]  [facility] [address] [place], [place] [address] [phone]  [facility] [address] [place], [place] [address] [phone]    Make sure to wear two-piece clothing.  No lotions, powders, or deodorants the day of the appointment. Make sure to bring picture ID and insurance card.  Bring list of medications you are currently taking including any supplements.   Schedule your [place] screening mammogram through MyChart!   Log into your MyChart account.  Go to 'Visit' (or 'Appointments' if on mobile App) --> Schedule an Appointment  Under 'Select a Reason for Visit' choose the Mammogram Screening option.  Complete the pre-visit questions and select the time and place that best fits your schedule.    This is a list of the screening recommended for you and due dates:  Health Maintenance  Topic Date Due   Flu Shot  07/12/2023   COVID-19 Vaccine (8 - 2024-25 season) 08/12/2023   Medicare Annual Wellness Visit  12/13/2024   Mammogram  01/12/2025   Colon Cancer Screening  06/06/2026   DTaP/Tdap/Td vaccine (2 - Td or Tdap) 10/09/2026   Pneumonia Vaccine  Completed   DEXA scan (bone density measurement)  Completed   Hepatitis C Screening  Completed   Zoster (Shingles) Vaccine  Completed   HPV Vaccine  Aged Out    Advanced directives: (Copy Requested) Please  bring a copy of your health care power of attorney and living will to the office to be added to your chart at your convenience.  Next Medicare Annual Wellness Visit scheduled for next year: Yes 12/16/24 @3pm  televisit

## 2023-12-17 ENCOUNTER — Encounter: Payer: Self-pay | Admitting: Family Medicine

## 2023-12-17 ENCOUNTER — Ambulatory Visit: Payer: Medicare Other | Admitting: Family Medicine

## 2023-12-17 VITALS — BP 108/68 | HR 73 | Temp 98.6°F | Ht 65.0 in | Wt 157.4 lb

## 2023-12-17 DIAGNOSIS — Z780 Asymptomatic menopausal state: Secondary | ICD-10-CM

## 2023-12-17 DIAGNOSIS — E039 Hypothyroidism, unspecified: Secondary | ICD-10-CM | POA: Diagnosis not present

## 2023-12-17 DIAGNOSIS — E785 Hyperlipidemia, unspecified: Secondary | ICD-10-CM | POA: Diagnosis not present

## 2023-12-17 DIAGNOSIS — G4700 Insomnia, unspecified: Secondary | ICD-10-CM

## 2023-12-17 DIAGNOSIS — Z23 Encounter for immunization: Secondary | ICD-10-CM | POA: Diagnosis not present

## 2023-12-17 DIAGNOSIS — N3281 Overactive bladder: Secondary | ICD-10-CM | POA: Diagnosis not present

## 2023-12-17 DIAGNOSIS — Z7189 Other specified counseling: Secondary | ICD-10-CM

## 2023-12-17 DIAGNOSIS — Z0000 Encounter for general adult medical examination without abnormal findings: Secondary | ICD-10-CM

## 2023-12-17 MED ORDER — LEVOTHYROXINE SODIUM 75 MCG PO TABS: 75.0000 ug | ORAL_TABLET | Freq: Every day | ORAL | 3 refills | Status: DC

## 2023-12-17 MED ORDER — SIMVASTATIN 20 MG PO TABS: 20.0000 mg | ORAL_TABLET | Freq: Every day | ORAL | 3 refills | Status: DC

## 2023-12-17 MED ORDER — SOLIFENACIN SUCCINATE 10 MG PO TABS: 10.0000 mg | ORAL_TABLET | Freq: Every day | ORAL | 3 refills | Status: DC

## 2023-12-17 MED ORDER — ACYCLOVIR 200 MG PO CAPS: 200.0000 mg | ORAL_CAPSULE | Freq: Three times a day (TID) | ORAL | 1 refills | Status: DC | PRN

## 2023-12-17 MED ORDER — DOXYCYCLINE HYCLATE 50 MG PO CAPS: 50.0000 mg | ORAL_CAPSULE | Freq: Two times a day (BID) | ORAL | 3 refills | Status: DC

## 2023-12-17 NOTE — Progress Notes (Signed)
 Flu 2025 Shingles 2022 PNA previously done Tetanus 2017 RSV previously done  COVID-vaccine previously done Colonoscopy 2017 Breast cancer screening 2024 Bone density test 2023- d/w pt about options to repeat in 2025.  She can call to schedule.  Advance directive-husband designated if patient were incapacitated.   Remeron  used for sleep.  Sleeping better with use.  No ADE on med.     Hypothyroid.  On replacement.  No ADE on med.  No neck mass, no dysphagia.     Vesicare  helped overactive bladder symptoms with extra 1/2 tab if she has sig travel.  No dysuria.     Elevated Cholesterol: Using medications without problems: yes Muscle aches: no Diet compliance: yes Exercise: yes She is exercising w/o chest pain.    She is putting up with neck pain, rarely taking ibuprofen .  She can update me as needed.  Not SOB, no BLE edema.   Meds, vitals, and allergies reviewed.   ROS: Per HPI unless specifically indicated in ROS section   GEN: nad, alert and oriented HEENT: ncat NECK: supple w/o LA CV: rrr. PULM: ctab, no inc wob ABD: soft, +bs EXT: no edema SKIN: well perfused.

## 2023-12-17 NOTE — Patient Instructions (Addendum)
 You can call for a DXA at [facility].  [address] [place] [phone] [id number]  Take care.  Glad to see you. Update me as needed.

## 2023-12-19 DIAGNOSIS — Z7189 Other specified counseling: Secondary | ICD-10-CM | POA: Insufficient documentation

## 2023-12-19 NOTE — Assessment & Plan Note (Signed)
 On replacement.  No ADE on med.  No neck mass, no dysphagia.  TSH normal.

## 2023-12-19 NOTE — Assessment & Plan Note (Signed)
 Vesicare helped overactive bladder symptoms with extra 1/2 tab if she has sig travel.  No dysuria.   Would continue as is.

## 2023-12-19 NOTE — Assessment & Plan Note (Addendum)
 Flu 2025 Shingles 2022 PNA previously done Tetanus 2017 RSV previously done  COVID-vaccine previously done Colonoscopy 2017 Breast cancer screening 2024 Bone density test 2023- d/w pt about options to repeat in 2025.  She can call to schedule.  Advance directive-husband designated if patient were incapacitated.

## 2023-12-19 NOTE — Assessment & Plan Note (Signed)
 Remeron used for sleep.  Sleeping better with use.  No ADE on med.   Would continue as is.

## 2023-12-19 NOTE — Assessment & Plan Note (Signed)
 Labs discussed with patient.  Continue work on diet and exercise.  Continue simvastatin.

## 2023-12-19 NOTE — Assessment & Plan Note (Signed)
Advance directive- husband designated if patient were incapacitated.  

## 2024-01-21 ENCOUNTER — Telehealth (INDEPENDENT_AMBULATORY_CARE_PROVIDER_SITE_OTHER): Payer: Medicare Other | Admitting: Psychiatry

## 2024-01-21 ENCOUNTER — Encounter: Payer: Self-pay | Admitting: Psychiatry

## 2024-01-21 DIAGNOSIS — F411 Generalized anxiety disorder: Secondary | ICD-10-CM | POA: Diagnosis not present

## 2024-01-21 DIAGNOSIS — F4312 Post-traumatic stress disorder, chronic: Secondary | ICD-10-CM

## 2024-01-21 NOTE — Progress Notes (Signed)
 Virtual Visit via Video Note  I connected with [name] on 01/21/24 at  1:00 PM EST by a video enabled telemedicine application and verified that I am speaking with the correct person using two identifiers.  Location Provider Location : [facility] Patient Location : Home  Participants: Patient , Provider    I discussed the limitations of evaluation and management by telemedicine and the availability of in person appointments. The patient expressed understanding and agreed to proceed.   I discussed the assessment and treatment plan with the patient. The patient was provided an opportunity to ask questions and all were answered. The patient agreed with the plan and demonstrated an understanding of the instructions.   The patient was advised to call back or seek an in-person evaluation if the symptoms worsen or if the condition fails to improve as anticipated.   BH MD OP Progress Note  01/21/2024 1:24 PM [name]  MRN:  [id number]  Chief Complaint:  Chief Complaint  Patient presents with   Follow-up   Depression   Anxiety   Medication Refill   HPI: [name] is a 73 year old Caucasian female, retired, married, lives in [place], has a history of PTSD, GAD was evaluated by telemedicine today.  She is currently managing her generalized anxiety disorder and PTSD with mirtazapine  taken at night for sleep and propranolol  for breakthrough anxiety, which she uses infrequently. She finds this regimen effective and has no concerns about her medications. She is actively engaged in therapy sessions with Ms.Quirino Buckles every four to six weeks, which she finds beneficial. No significant symptoms of depression are present, although she occasionally experiences a 'bad day'. Denies thoughts of self-harm.  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 She also works with [employer].  She recently had her annual physical and a Medicare wellness visit, both of which went well. Her labs, including thyroid  function tests, were satisfactory.  Patient currently denies any suicidality, homicidality or perceptual disturbances.  Currently compliant on her medication, mirtazapine  and propranolol .  Denies side effects.  Denies any other concerns today.   Visit Diagnosis:  1. GAD (generalized anxiety disorder)  F41.1 mirtazapine  (REMERON ) 7.5 MG tablet     2. Chronic post-traumatic stress disorder (PTSD)  F43.12         Past Psychiatric History: I have reviewed past psychiatric history from progress note on 03/11/2018.  Past Medical History:  Past Medical History:  Diagnosis Date   Anxiety    Basal cell carcinoma    L thigh, txted ~1990 in [place]    Rosacea    ocular, on chronic doxycycline  treatment   Thyroid  disease     Past Surgical History:  Procedure Laterality Date   ABDOMINAL HYSTERECTOMY  1992   Total   BREAST BIOPSY Right 07/20/2015   neg fat necrosis   BREAST BIOPSY Left 01/24/2023   US  Bx, Venus Clip, Path pending   BREAST BIOPSY Left 01/24/2023   US  LT BREAST BX W LOC DEV 1ST LESION IMG BX SPEC US  GUIDE 01/24/2023 [facility]   BREAST BIOPSY  03/13/2023   MM LT RADIOACTIVE SEED LOC MAMMO GUIDE 03/13/2023 [facility]   BREAST EXCISIONAL BIOPSY Right 1960's   negative. No scar seen   CERVICAL FUSION  2005,2010   C2-C7   RADIOACTIVE SEED GUIDED EXCISIONAL BREAST BIOPSY Left 03/13/2023   Procedure: RADIOACTIVE  SEED GUIDED EXCISIONAL LEFT BREAST BIOPSY;  Surgeon: Enid Harry, MD;  Location: [facility];  Service: General;  Laterality: Left;   REDUCTION MAMMAPLASTY Bilateral 2016   TONSILLECTOMY  1961    Family Psychiatric History: I have reviewed family psychiatric history from progress note on 03/11/2018.  Family History:  Family History  Problem  Relation Age of Onset   Ovarian cancer Mother 9   Anxiety disorder Mother    Depression Mother    Heart disease Father    Stroke Father    Diabetes Father    Lung cancer Maternal Uncle        d. 24; smoking hx   Cancer Maternal Grandmother 70       ovarian or uterine?   ADD / ADHD Daughter    ADD / ADHD Grandchild    Melanoma Cousin        mat female cousin; dx 65s; back   Cancer Cousin        mat female cousins x2; unknown primary w/ mets   Prostate cancer Neg Hx    Kidney cancer Neg Hx    Breast cancer Neg Hx    Colon cancer Neg Hx     Social History: I have reviewed social history from progress note on 03/11/2018. Social History   Socioeconomic History   Marital status: Married    Spouse name: jeff   Number of children: 2   Years of education: Not on file   Highest education level: Master's degree (e.g., MA, MS, MEng, MEd, MSW, MBA)  Occupational History    Comment: retired  Tobacco Use   Smoking status: Never   Smokeless tobacco: Never  Vaping Use   Vaping status: Never Used  Substance and Sexual Activity   Alcohol use: Not Currently    Comment: none   Drug use: No   Sexual activity: Yes    Birth control/protection: None  Other Topics Concern   Not on file  Social History Narrative   Married 2004   3 kids total (she has two, huband has one)   4 grandkids alive, 1 had passed away   Retired.     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   Social Drivers of Corporate investment banker Strain: Low Risk  (12/14/2023)   Overall Financial Resource Strain (CARDIA)    Difficulty of Paying Living Expenses: Not hard at all  Food Insecurity: No Food Insecurity (12/14/2023)   Hunger Vital Sign    Worried About Running Out of Food in the Last Year: Never true    Ran Out of Food in the Last Year: Never true  Transportation Needs: No Transportation Needs (12/14/2023)   PRAPARE - Administrator, Civil Service (Medical): No    Lack of  Transportation (Non-Medical): No  Physical Activity: Sufficiently Active (12/14/2023)   Exercise Vital Sign    Days of Exercise per Week: 5 days    Minutes of Exercise per Session: 30 min  Stress: No Stress Concern Present (12/14/2023)   [employer] of Occupational Health - Occupational Stress Questionnaire    Feeling of Stress : Not at all  Social Connections: Moderately Integrated (12/14/2023)   Social Connection and Isolation Panel [NHANES]    Frequency of Communication with Friends and Family: More than three times a week    Frequency of Social Gatherings with Friends and Family: More than three times a week    Attends Religious Services:  More than 4 times per year    Active Member of Clubs or Organizations: No    Attends Banker Meetings: Never    Marital Status: Married    Allergies:  Allergies  Allergen Reactions   Codeine Hives   Iodinated Contrast Media Itching   Metrizamide Itching   Latex    Vancomycin  Itching    "Requests benadryl  given with vancomycin "    Metabolic Disorder Labs: No results found for: "HGBA1C", "MPG" No results found for: "PROLACTIN" Lab Results  Component Value Date   CHOL 177 12/06/2023   TRIG 73 12/06/2023   HDL 71 12/06/2023   CHOLHDL 2.5 12/06/2023   VLDL 82.9 11/30/2022   LDLCALC 92 12/06/2023   LDLCALC 66 11/30/2022   Lab Results  Component Value Date   TSH 1.960 12/06/2023   TSH 2.68 11/30/2022    Therapeutic Level Labs: No results found for: "LITHIUM" No results found for: "VALPROATE" No results found for: "CBMZ"  Current Medications: Current Outpatient Medications  Medication Sig Dispense Refill   acyclovir  (ZOVIRAX ) 200 MG capsule Take 1 capsule (200 mg total) by mouth 3 (three) times daily as needed. 30 capsule 1   doxycycline  (VIBRAMYCIN ) 50 MG capsule Take 1 capsule (50 mg total) by mouth 2 (two) times daily. 180 capsule 3   ibuprofen (ADVIL,MOTRIN) 200 MG tablet Take 200 mg by mouth every 6 (six) hours  as needed for moderate pain.     levothyroxine  (SYNTHROID ) 75 MCG tablet Take 1 tablet (75 mcg total) by mouth daily before breakfast. 90 tablet 3   mirtazapine  (REMERON ) 7.5 MG tablet Take 1 tablet (7.5 mg total) by mouth at bedtime. 90 tablet 3   Multiple Vitamins-Minerals (OCUVITE ADULT 50+ PO) Take by mouth.     Omega-3 Fatty Acids (FISH OIL) 1000 MG CAPS Take by mouth.     propranolol  (INDERAL ) 10 MG tablet Take 1 tablet (10 mg total) by mouth 3 (three) times daily as needed. 270 tablet 3   simvastatin  (ZOCOR ) 20 MG tablet Take 1 tablet (20 mg total) by mouth at bedtime. 90 tablet 3   solifenacin  (VESICARE ) 10 MG tablet Take 1 tablet (10 mg total) by mouth daily. 90 tablet 3   White Petrolatum-Mineral Oil (GENTEAL TEARS NIGHT-TIME) OINT Apply to eye.     No current facility-administered medications for this visit.   Facility-Administered Medications Ordered in Other Visits  Medication Dose Route Frequency Provider Last Rate Last Admin   diphenhydrAMINE  (BENADRYL ) injection 25 mg  25 mg Intravenous Once Enid Harry, MD         Musculoskeletal: Strength & Muscle Tone:  UTA Gait & Station:  Seated Patient leans: N/A  Psychiatric Specialty Exam: Review of Systems  Psychiatric/Behavioral: Negative.      There were no vitals taken for this visit.There is no height or weight on file to calculate BMI.  General Appearance: Casual  Eye Contact:  Good  Speech:  Clear and Coherent  Volume:  Normal  Mood:  Euthymic  Affect:  Appropriate  Thought Process:  Goal Directed and Descriptions of Associations: Intact  Orientation:  Full (Time, Place, and Person)  Thought Content: Logical   Suicidal Thoughts:  No  Homicidal Thoughts:  No  Memory:  Immediate;   Fair Recent;   Fair Remote;   Fair  Judgement:  Fair  Insight:  Fair  Psychomotor Activity:  Normal  Concentration:  Concentration: Fair and Attention Span: Fair  Recall:  [employer] of Knowledge: Fair  Language: Fair   Akathisia:  No  Handed:  Right  AIMS (if indicated): not done  Assets:  Desire for Improvement Housing Social Support  ADL's:  Intact  Cognition: WNL  Sleep:  Fair   Screenings: AIMS    Flowsheet Row Office Visit from 07/26/2023 in [facility]  AIMS Total Score 0      GAD-7    Flowsheet Row Office Visit from 07/26/2023 in [facility] Office Visit from 02/08/2023 in [facility] Video Visit from 08/01/2022 in [facility] Video Visit from 02/02/2022 in [facility] Video Visit from 07/21/2021 in [facility]  Total GAD-7 Score 5 0 3 6 3       PHQ2-9    Flowsheet Row Clinical Support from 12/14/2023 in [facility] Office Visit from 07/26/2023 in [facility] Video Visit from 04/04/2023 in [facility] Office Visit from 02/08/2023 [facility] Visit from 08/01/2022 in [facility]  PHQ-2 Total Score 0 0 0 0 1  PHQ-9 Total Score -- -- -- 1 --      Flowsheet Row Video Visit from 01/21/2024 in [facility] Office Visit from 07/26/2023 in [facility] Video Visit from 04/04/2023 in [facility]  C-SSRS RISK CATEGORY No Risk No Risk No Risk        Assessment and Plan: [name] is a 73 year old Caucasian female who is married, retired, has a history of PTSD, anxiety, currently stable on current medication regimen was evaluated by telemedicine today.  Discussed assessment and plan as noted below.  Generalized anxiety disorder-stable GAD well-managed  with mirtazapine  for sleep and propranolol  for breakthrough anxiety. Current regimen effective with no concerns. Engages in activities supporting mental health. - Continue Mirtazapine  7.5 mg at bedtime for sleep - Continue Propranolol  10 mg 3 times a day as needed - Continue CBT with Ms. Quirino Buckles  PTSD-stable PTSD managed with therapy sessions every four to six weeks. Therapy effective, no additional support needed. - Continue Mirtazapine  7.5 mg at bedtime for sleep - Continue CBT  Labs - TSH -12/06/2019 24-1.960-within normal limits, CBC with differential-within normal limits, CMP-within normal limits, lipid panel-within normal limits.  Reviewed and discussed with patient.   Collaboration of Care: Collaboration of Care: Referral or follow-up with counselor/therapist AEB patient encouraged to continue CBT   Follow-up - Schedule follow-up visit for July 16, 2024, at 10:30 AM.  Patient/Guardian was advised Release of Information must be obtained prior to any record release in order to collaborate their care with an outside provider. Patient/Guardian was advised if they have not already done so to contact the registration department to sign all necessary forms in order for us  to release information regarding their care.   Consent: Patient/Guardian gives verbal consent for treatment and assignment of benefits for services provided during this visit. Patient/Guardian expressed understanding and agreed to proceed.   This note was generated in part or whole with voice recognition software. Voice recognition is usually quite accurate but there are transcription errors that can and very often do occur. I apologize for any typographical errors that were not detected and corrected.    Domenic Schoenberger, MD 01/21/2024, 1:24 PM

## 2024-02-07 ENCOUNTER — Telehealth: Payer: Self-pay

## 2024-02-07 NOTE — Telephone Encounter (Signed)
 Left message for patient to advised that we received a fax from the pharmacy that the alvogen brand for the levothyroxine tab is not available and they wanted to substitute the brand. Dr. Para March is okay with the change in the brand and we just wanted to let her know so that there will be no surprises on her end.

## 2024-03-04 ENCOUNTER — Ambulatory Visit: Payer: PRIVATE HEALTH INSURANCE | Admitting: Dermatology

## 2024-04-03 ENCOUNTER — Ambulatory Visit (INDEPENDENT_AMBULATORY_CARE_PROVIDER_SITE_OTHER): Payer: PRIVATE HEALTH INSURANCE | Admitting: Dermatology

## 2024-04-03 ENCOUNTER — Encounter: Payer: Self-pay | Admitting: Dermatology

## 2024-04-03 DIAGNOSIS — L988 Other specified disorders of the skin and subcutaneous tissue: Secondary | ICD-10-CM

## 2024-04-03 NOTE — Patient Instructions (Signed)

## 2024-04-03 NOTE — Progress Notes (Signed)
   Follow-Up Visit   Subjective  [name] is a 73 y.o. female who presents for the following: 5 months f/u Botox for facial elastosis  The following portions of the chart were reviewed this encounter and updated as appropriate: medications, allergies, medical history  Review of Systems:  No other skin or systemic complaints except as noted in HPI or Assessment and Plan.  Objective  Well appearing patient in no apparent distress; mood and affect are within normal limits.  A focused examination was performed of the face.  Relevant physical exam findings are noted in the Assessment and Plan.   Assessment & Plan   Facial Elastosis  Location: See attached image  Informed consent: Discussed risks (infection, pain, bleeding, bruising, swelling, allergic reaction, paralysis of nearby muscles, eyelid droop, double vision, neck weakness, difficulty breathing, headache, undesirable cosmetic result, and need for additional treatment) and benefits of the procedure, as well as the alternatives.  Informed consent was obtained.  Preparation: The area was cleansed with alcohol.  Procedure Details:  Botox was injected into the dermis with a 30-gauge needle. Pressure applied to any bleeding. Ice packs offered for swelling.  Lot Number:  [id number] Expiration:  01/2026  Total Units Injected:  20  Plan: Tylenol  may be used for headache.  Allow 2 weeks before returning to clinic for additional dosing as needed. Patient will call for any problems.  Return in about 3 months (around 07/03/2024) for Botox .  IClara Crisp, CMA, am acting as scribe for Celine Collard, MD .   Documentation: I have reviewed the above documentation for accuracy and completeness, and I agree with the above.  Celine Collard, MD

## 2024-05-23 ENCOUNTER — Ambulatory Visit (INDEPENDENT_AMBULATORY_CARE_PROVIDER_SITE_OTHER): Admitting: Podiatry

## 2024-05-23 ENCOUNTER — Encounter: Payer: Self-pay | Admitting: Podiatry

## 2024-05-23 DIAGNOSIS — M79675 Pain in left toe(s): Secondary | ICD-10-CM | POA: Diagnosis not present

## 2024-05-23 DIAGNOSIS — M205X2 Other deformities of toe(s) (acquired), left foot: Secondary | ICD-10-CM

## 2024-05-29 NOTE — Progress Notes (Signed)
 Subjective: [name] presents today referred by Donnie Galea, MD for complaint of painful left 5th toe for the past several weeks.  Past Medical History:  Diagnosis Date   Anxiety    Basal cell carcinoma    L thigh, txted ~1990 in [place]    Rosacea    ocular, on chronic doxycycline  treatment   Thyroid  disease      Patient Active Problem List   Diagnosis Date Noted   Advance care planning 12/19/2023   Healthcare maintenance 04/24/2023   Insomnia 12/17/2022   Tremors of nervous system 02/02/2022   HLD (hyperlipidemia) 11/09/2021   GAD (generalized anxiety disorder) 08/14/2019   Chronic post-traumatic stress disorder (PTSD) 08/14/2019   History of cold sores 04/23/2018   OAB (overactive bladder) 04/18/2016   Hypothyroidism 07/30/2014     Past Surgical History:  Procedure Laterality Date   ABDOMINAL HYSTERECTOMY  1992   Total   BREAST BIOPSY Right 07/20/2015   neg fat necrosis   BREAST BIOPSY Left 01/24/2023   US  Bx, Venus Clip, Path pending   BREAST BIOPSY Left 01/24/2023   US  LT BREAST BX W LOC DEV 1ST LESION IMG BX SPEC US  GUIDE 01/24/2023 [facility]   BREAST BIOPSY  03/13/2023   MM LT RADIOACTIVE SEED LOC MAMMO GUIDE 03/13/2023 [facility]   BREAST EXCISIONAL BIOPSY Right 1960's   negative. No scar seen   CERVICAL FUSION  2005,2010   C2-C7   RADIOACTIVE SEED GUIDED EXCISIONAL BREAST BIOPSY Left 03/13/2023   Procedure: RADIOACTIVE SEED GUIDED EXCISIONAL LEFT BREAST BIOPSY;  Surgeon: Enid Harry, MD;  Location: [facility];  Service: General;  Laterality: Left;   REDUCTION MAMMAPLASTY Bilateral 2016   TONSILLECTOMY  1961     Current Outpatient Medications on File Prior to Visit  Medication Sig Dispense Refill   acyclovir  (ZOVIRAX ) 200 MG capsule Take 1 capsule (200 mg total) by mouth 3 (three) times daily as needed. 30 capsule 1   doxycycline  (VIBRAMYCIN ) 50 MG capsule Take 1 capsule (50 mg total) by mouth 2 (two) times daily. 180  capsule 3   ibuprofen (ADVIL,MOTRIN) 200 MG tablet Take 200 mg by mouth every 6 (six) hours as needed for moderate pain.     levothyroxine  (SYNTHROID ) 75 MCG tablet Take 1 tablet (75 mcg total) by mouth daily before breakfast. 90 tablet 3   mirtazapine  (REMERON ) 7.5 MG tablet Take 1 tablet (7.5 mg total) by mouth at bedtime. 90 tablet 3   Multiple Vitamins-Minerals (OCUVITE ADULT 50+ PO) Take by mouth.     Omega-3 Fatty Acids (FISH OIL) 1000 MG CAPS Take by mouth.     propranolol  (INDERAL ) 10 MG tablet Take 1 tablet (10 mg total) by mouth 3 (three) times daily as needed. 270 tablet 3   simvastatin  (ZOCOR ) 20 MG tablet Take 1 tablet (20 mg total) by mouth at bedtime. 90 tablet 3   solifenacin  (VESICARE ) 10 MG tablet Take 1 tablet (10 mg total) by mouth daily. 90 tablet 3   White Petrolatum-Mineral Oil (GENTEAL TEARS NIGHT-TIME) OINT Apply to eye.     Current Facility-Administered Medications on File Prior to Visit  Medication Dose Route Frequency Provider Last Rate Last Admin   diphenhydrAMINE  (BENADRYL ) injection 25 mg  25 mg Intravenous Once Enid Harry, MD         Allergies  Allergen Reactions   Codeine Hives   Iodinated Contrast Media Itching   Metrizamide Itching   Latex    Vancomycin  Itching    Requests benadryl   given with vancomycin      Social History   Occupational History    Comment: retired  Tobacco Use   Smoking status: Never   Smokeless tobacco: Never  Vaping Use   Vaping status: Never Used  Substance and Sexual Activity   Alcohol use: Not Currently    Comment: none   Drug use: No   Sexual activity: Yes    Birth control/protection: None     Family History  Problem Relation Age of Onset   Ovarian cancer Mother 40   Anxiety disorder Mother    Depression Mother    Heart disease Father    Stroke Father    Diabetes Father    Lung cancer Maternal Uncle        d. 59; smoking hx   Cancer Maternal Grandmother 17       ovarian or uterine?   ADD / ADHD  Daughter    ADD / ADHD Grandchild    Melanoma Cousin        mat female cousin; dx 73s; back   Cancer Cousin        mat female cousins x2; unknown primary w/ mets   Prostate cancer Neg Hx    Kidney cancer Neg Hx    Breast cancer Neg Hx    Colon cancer Neg Hx      Immunization History  Administered Date(s) Administered   Fluad Trivalent(High Dose 65+) 12/17/2023   Influenza Inj Mdck Quad Pf 09/10/2019   Influenza, High Dose Seasonal PF 09/06/2022   Influenza,inj,Quad PF,6+ Mos 09/10/2020   Influenza-Unspecified 10/10/2021   PFIZER(Purple Top)SARS-COV-2 Vaccination 01/19/2020, 02/13/2020, 09/10/2020, 03/15/2021   Pfizer Covid-19 Vaccine Bivalent Booster 55yrs & up 10/10/2021, 04/10/2022   Pfizer(Comirnaty)Fall Seasonal Vaccine 12 years and older 09/06/2022   Pneumococcal Conjugate-13 10/30/2019   Pneumococcal Polysaccharide-23 11/02/2020, 09/06/2022   Rsv, Bivalent, Protein Subunit Rsvpref,pf Pattricia Bores) 11/30/2022   Tdap 10/09/2016   Zoster Recombinant(Shingrix) 10/30/2019, 12/31/2019   Zoster, Live 11/15/2016     Objective: There were no vitals filed for this visit.  [name] is a pleasant 73 y.o. female WD, WN in NAD. AAO x 3.  Vascular Examination: Vascular status intact b/l with palpable pedal pulses. Pedal hair present b/l. CFT immediate b/l. No edema. No pain with calf compression b/l. Skin temperature gradient WNL b/l. No edema noted b/l LE.  Neurological Examination: Sensation grossly intact b/l with 10 gram monofilament. Vibratory sensation intact b/l.   Dermatological Examination: Pedal skin with normal turgor, texture and tone b/l. Toenails 1-5 b/l well maintained with adequate length. No erythema, no edema, no drainage, no fluctuance. Hyperkeratotic lesion(s) L 5th toe.  No erythema, no edema, no drainage, no fluctuance.  Musculoskeletal Examination: Muscle strength 5/5 to b/l LE. No pain, crepitus or joint limitation noted with ROM bilateral LE. Adductovarus  deformity L 4th toe and L 5th toe.  Radiographs: None  Assessment: 1. Adductovarus rotation of toe, acquired, left   2. Pain in left toe(s)     Plan: -Patient was evaluated today. All questions/concerns addressed on today's visit. -Discussed digital deformity, conservative (padding, periodic debridement) vs surgical treatment of  left 4th and 5th digits. -Patient referred to Dr. Jeni Mitten  for evaluation of adductovarus deformity left 4th/5th digits. -Patient/POA to call should there be question/concern in the interim.  Return in about 3 months (around 08/23/2024).  Luella Sager, DPM      [facility] LOCATION: [address]  [place], [place] [address]                   Office [phone]   [facility] LOCATION: [address] [place], [place] [address] Office [phone]

## 2024-06-02 ENCOUNTER — Ambulatory Visit (INDEPENDENT_AMBULATORY_CARE_PROVIDER_SITE_OTHER): Admitting: Podiatry

## 2024-06-02 ENCOUNTER — Encounter: Payer: Self-pay | Admitting: Podiatry

## 2024-06-02 ENCOUNTER — Ambulatory Visit (INDEPENDENT_AMBULATORY_CARE_PROVIDER_SITE_OTHER)

## 2024-06-02 DIAGNOSIS — M2041 Other hammer toe(s) (acquired), right foot: Secondary | ICD-10-CM

## 2024-06-02 DIAGNOSIS — M205X2 Other deformities of toe(s) (acquired), left foot: Secondary | ICD-10-CM

## 2024-06-02 NOTE — Progress Notes (Signed)
 Subjective:  Patient ID: [name], female    DOB: 1951-07-25,  MRN: [id number]  Chief Complaint  Patient presents with   Toe Pain    My fourth toe is kind of twisting, causing a corn.  She referred me to him.    Discussed the use of AI scribe software for clinical note transcription with the patient, who gave verbal consent to proceed.  History of Present Illness [name] is a 73 year old female who presents with pain in the right fourth toe.  She experiences a burning pain in her right fourth toe, particularly under the toe, which is tender to touch and worsens when stretched. The pain has progressively worsened, affecting her ability to wear certain shoes and necessitating the use of specific socks and shoes for comfort. There is no recollection of a specific injury to the toe, though she acknowledges the possibility of a past fracture. The toe feels twisted and painful when it touches the ground, impacting her daily activities, although she remains able to walk.  Her family history includes foot issues, with her father being a double amputee. She previously consulted a podiatrist who suggested a capsule in her other foot might have broken, but she currently experiences no significant pain in that foot.  Her social history includes a career as a Designer, jewellery and diabetes specialist, involving extensive travel across several states.      Objective:    Physical Exam VASCULAR: DP and PT pulses palpable. Foot is warm and well-perfused. Capillary fill time is brisk. DERMATOLOGIC: Normal skin turgor, texture, and temperature. No open lesions, rashes, or ulcerations. NEUROLOGIC: Normal sensation to light touch and pressure. No paresthesias. ORTHOPEDIC: Lesser digital contractures on multiple toes. Painful adductovarus contracture of the right fourth toe with a plantar lateral callus, tender to touch. Deformity of the right fourth toe is semi-reducible.        Results RADIOLOGY Radiographs of the right foot: Significant subluxation of the distal interphalangeal joint of the right fourth toe with arthritic changes in the distal and proximal interphalangeal joints (06/02/2024)   Assessment:   1. Hammertoe of right foot      Plan:  Patient was evaluated and treated and all questions answered.  Assessment and Plan Assessment & Plan Arthritis of right fourth toe Significant subluxation of the distal interphalangeal joint with arthritic changes in the distal and proximal interphalangeal joints, causing burning pain and discomfort, particularly when the toe contacts the ground. Differential diagnosis includes an old fracture or ligament tear. The condition is semi-reducible and likely exacerbated by aging, causing limitations in footwear and activities. - Recommend phalangectomy of the middle phalanx of the right fourth toe to alleviate pain and correct deformity. - Discuss risks and benefits of surgery, including the need for sedation, a few weeks of recovery, and the use of a special post-op shoe. - Schedule outpatient surgery for the fall, after her planned trips. - Advise keeping the toe supported with silicone pads (toe tubes) until surgery. - Ensure the toe remains dry for the first week post-surgery and limit walking to around the house for the first couple of weeks.       Surgical plan:  Procedure: -right fourth toe middle phalangectomy  Location: - [facility]  Anesthesia plan: - Sedation with local  Postoperative pain plan: - Tylenol  1000 mg every 6 hours, ibuprofen 600 mg every 6 hours, tramadol 50 mg every 6 hours as needed  DVT prophylaxis: - None required  WB Restrictions / DME  needs: - WBAT in postop shoe    No follow-ups on file.

## 2024-06-10 ENCOUNTER — Telehealth: Payer: Self-pay | Admitting: Podiatry

## 2024-06-10 NOTE — Telephone Encounter (Signed)
 Received surgical consent forms.  Left message for pt to call to get her surgery scheduled.

## 2024-06-11 NOTE — Telephone Encounter (Signed)
 Pt called back and left message stating she was looking at surgery after 10/23 and before 11/7.SABRA  I returned call and pt is scheduled for 10/24.  She is not taking any blood thinners or GLP1 medications.  Pharmacy is correct in chart.

## 2024-07-03 ENCOUNTER — Ambulatory Visit: Payer: Self-pay | Admitting: Dermatology

## 2024-07-03 ENCOUNTER — Ambulatory Visit (INDEPENDENT_AMBULATORY_CARE_PROVIDER_SITE_OTHER): Payer: Self-pay | Admitting: Dermatology

## 2024-07-03 DIAGNOSIS — L988 Other specified disorders of the skin and subcutaneous tissue: Secondary | ICD-10-CM

## 2024-07-03 NOTE — Progress Notes (Signed)
   Follow-Up Visit   Subjective  [name] is a 73 y.o. female who presents for the following: Botox for facial elastosis  The following portions of the chart were reviewed this encounter and updated as appropriate: medications, allergies, medical history  Review of Systems:  No other skin or systemic complaints except as noted in HPI or Assessment and Plan.  Objective  Well appearing patient in no apparent distress; mood and affect are within normal limits.  A focused examination was performed of the face.  Relevant physical exam findings are noted in the Assessment and Plan.      Assessment & Plan    Facial Elastosis  Location: See attached image  Informed consent: Discussed risks (infection, pain, bleeding, bruising, swelling, allergic reaction, paralysis of nearby muscles, eyelid droop, double vision, neck weakness, difficulty breathing, headache, undesirable cosmetic result, and need for additional treatment) and benefits of the procedure, as well as the alternatives.  Informed consent was obtained.  Preparation: The area was cleansed with alcohol.  Procedure Details:  Botox was injected into the dermis with a 30-gauge needle. Pressure applied to any bleeding. Ice packs offered for swelling.  Lot Number:  [id number] Expiration:  04/2026  Total Units Injected:  20  Plan: Tylenol  may be used for headache.  Allow 2 weeks before returning to clinic for additional dosing as needed. Patient will call for any problems.  Return in about 4 months (around 11/03/2024) for Botox injections.  LILLETTE Rosina Mayans, CMA, am acting as scribe for Alm Rhyme, MD .   Documentation: I have reviewed the above documentation for accuracy and completeness, and I agree with the above.  Alm Rhyme, MD

## 2024-07-03 NOTE — Patient Instructions (Signed)

## 2024-07-08 ENCOUNTER — Encounter: Payer: Self-pay | Admitting: Dermatology

## 2024-07-16 ENCOUNTER — Ambulatory Visit: Payer: Medicare Other | Admitting: Psychiatry

## 2024-07-21 ENCOUNTER — Ambulatory Visit (INDEPENDENT_AMBULATORY_CARE_PROVIDER_SITE_OTHER): Admitting: Psychiatry

## 2024-07-21 ENCOUNTER — Encounter: Payer: Self-pay | Admitting: Psychiatry

## 2024-07-21 VITALS — BP 120/82 | HR 81 | Temp 98.3°F | Ht 65.0 in | Wt 157.2 lb

## 2024-07-21 DIAGNOSIS — F411 Generalized anxiety disorder: Secondary | ICD-10-CM | POA: Diagnosis not present

## 2024-07-21 DIAGNOSIS — F4312 Post-traumatic stress disorder, chronic: Secondary | ICD-10-CM

## 2024-07-21 MED ORDER — MIRTAZAPINE 7.5 MG PO TABS: 7.5000 mg | ORAL_TABLET | Freq: Every day | ORAL | 3 refills | Status: AC

## 2024-07-21 NOTE — Progress Notes (Signed)
 [facility] MD OP Progress Note  07/21/2024 10:48 AM [name]  MRN:  [id number]  Chief Complaint:  Chief Complaint  Patient presents with   Follow-up   Anxiety   Depression   Medication Refill   Discussed the use of AI scribe software for clinical note transcription with the patient, who gave verbal consent to proceed.  History of Present Illness [name] is a 73 year old Caucasian female, retired, married, lives in [place], has a history of PTSD, GAD was evaluated in office today for a follow-up appointment.  Since her last appointment in February, she reports doing well and describes no current problems with mood or anxiety. She attends ongoing therapy sessions with Ms.Ellouise Hummer about once a month in person, which provide support, especially in addressing triggers related to her grandson, who reminds her of her father and displays some similar personality traits. She identifies her grandson as a current trigger for trauma-related symptoms but manages these situations by taking propranolol  as needed, typically twice per week, which helps her emotionally separate her grandson from her father. She continues to find coping strategies discussed in therapy helpful, and she feels supported by her ongoing work with her therapist.  She denies any worsening of mood symptoms and describes herself as being at zero on everything regarding mood concerns. Sleep remains restful, and she denies any sleep disturbances or restless leg symptoms.  She denies any suicidality, homicidality or perceptual disturbances.  Her current medications include mirtazapine  7.5 mg for sleep, propranolol  as needed (approximately twice per week), Synthroid , and doxycycline  for her eyes. She reports no new medications since her last visit and denies issues with her current regimen.  She has a history of breast cancer, lumpectomy, cervical spine fusion from C2 to C7, and bone spurs.  She is currently doing overall well  physically.   Visit Diagnosis:    ICD-10-CM   1. GAD (generalized anxiety disorder)  F41.1 mirtazapine  (REMERON ) 7.5 MG tablet    2. Chronic post-traumatic stress disorder (PTSD)  F43.12       Past Psychiatric History: I have reviewed past psychiatric history from progress note on 03/11/2018.  Past Medical History:  Past Medical History:  Diagnosis Date   Anxiety    Basal cell carcinoma    L thigh, txted ~1990 in [place]    Rosacea    ocular, on chronic doxycycline  treatment   Thyroid  disease     Past Surgical History:  Procedure Laterality Date   ABDOMINAL HYSTERECTOMY  1992   Total   BREAST BIOPSY Right 07/20/2015   neg fat necrosis   BREAST BIOPSY Left 01/24/2023   US  Bx, Venus Clip, Path pending   BREAST BIOPSY Left 01/24/2023   US  LT BREAST BX W LOC DEV 1ST LESION IMG BX SPEC US  GUIDE 01/24/2023 [facility]   BREAST BIOPSY  03/13/2023   MM LT RADIOACTIVE SEED LOC MAMMO GUIDE 03/13/2023 [facility]   BREAST EXCISIONAL BIOPSY Right 1960's   negative. No scar seen   CERVICAL FUSION  2005,2010   C2-C7   RADIOACTIVE SEED GUIDED EXCISIONAL BREAST BIOPSY Left 03/13/2023   Procedure: RADIOACTIVE SEED GUIDED EXCISIONAL LEFT BREAST BIOPSY;  Surgeon: Ebbie Cough, MD;  Location: [facility];  Service: General;  Laterality: Left;   REDUCTION MAMMAPLASTY Bilateral 2016   TONSILLECTOMY  1961    Family Psychiatric History: I have reviewed family psychiatric history from progress note on 03/11/2018.  Family History:  Family History  Problem Relation Age of Onset   Ovarian cancer  Mother 81   Anxiety disorder Mother    Depression Mother    Heart disease Father    Stroke Father    Diabetes Father    Lung cancer Maternal Uncle        d. 19; smoking hx   Cancer Maternal Grandmother 88       ovarian or uterine?   ADD / ADHD Daughter    ADD / ADHD Grandchild    Melanoma Cousin        mat female cousin; dx 34s; back   Cancer Cousin        mat female  cousins x2; unknown primary w/ mets   Prostate cancer Neg Hx    Kidney cancer Neg Hx    Breast cancer Neg Hx    Colon cancer Neg Hx     Social History: I have reviewed social history from progress note on 03/11/2018. Social History   Socioeconomic History   Marital status: Married    Spouse name: jeff   Number of children: 2   Years of education: Not on file   Highest education level: Master's degree (e.g., MA, MS, MEng, MEd, MSW, MBA)  Occupational History    Comment: retired  Tobacco Use   Smoking status: Never   Smokeless tobacco: Never  Vaping Use   Vaping status: Never Used  Substance and Sexual Activity   Alcohol use: Not Currently    Comment: none   Drug use: No   Sexual activity: Yes    Birth control/protection: None  Other Topics Concern   Not on file  Social History Narrative   Married 2004   3 kids total (she has two, huband has one)   4 grandkids alive, 1 had passed away   Retired.     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   Social Drivers of Corporate investment banker Strain: Low Risk  (12/14/2023)   Overall Financial Resource Strain (CARDIA)    Difficulty of Paying Living Expenses: Not hard at all  Food Insecurity: No Food Insecurity (12/14/2023)   Hunger Vital Sign    Worried About Running Out of Food in the Last Year: Never true    Ran Out of Food in the Last Year: Never true  Transportation Needs: No Transportation Needs (12/14/2023)   PRAPARE - Administrator, Civil Service (Medical): No    Lack of Transportation (Non-Medical): No  Physical Activity: Sufficiently Active (12/14/2023)   Exercise Vital Sign    Days of Exercise per Week: 5 days    Minutes of Exercise per Session: 30 min  Stress: No Stress Concern Present (12/14/2023)   [employer] of Occupational Health - Occupational Stress Questionnaire    Feeling of Stress : Not at all  Social Connections: Moderately Integrated (12/14/2023)   Social  Connection and Isolation Panel    Frequency of Communication with Friends and Family: More than three times a week    Frequency of Social Gatherings with Friends and Family: More than three times a week    Attends Religious Services: More than 4 times per year    Active Member of [employer] or Organizations: No    Attends Banker Meetings: Never    Marital Status: Married    Allergies:  Allergies  Allergen Reactions   Codeine Hives   Iodinated Contrast Media Itching   Metrizamide Itching   Latex    Vancomycin  Itching    Requests benadryl  given  with vancomycin     Metabolic Disorder Labs: No results found for: HGBA1C, MPG No results found for: PROLACTIN Lab Results  Component Value Date   CHOL 177 12/06/2023   TRIG 73 12/06/2023   HDL 71 12/06/2023   CHOLHDL 2.5 12/06/2023   VLDL 83.3 11/30/2022   LDLCALC 92 12/06/2023   LDLCALC 66 11/30/2022   Lab Results  Component Value Date   TSH 1.960 12/06/2023   TSH 2.68 11/30/2022    Therapeutic Level Labs: No results found for: LITHIUM No results found for: VALPROATE No results found for: CBMZ  Current Medications: Current Outpatient Medications  Medication Sig Dispense Refill   acyclovir  (ZOVIRAX ) 200 MG capsule Take 1 capsule (200 mg total) by mouth 3 (three) times daily as needed. 30 capsule 1   doxycycline  (VIBRAMYCIN ) 50 MG capsule Take 1 capsule (50 mg total) by mouth 2 (two) times daily. 180 capsule 3   ibuprofen (ADVIL,MOTRIN) 200 MG tablet Take 200 mg by mouth every 6 (six) hours as needed for moderate pain.     levothyroxine  (SYNTHROID ) 75 MCG tablet Take 1 tablet (75 mcg total) by mouth daily before breakfast. 90 tablet 3   Multiple Vitamins-Minerals (OCUVITE ADULT 50+ PO) Take by mouth.     Omega-3 Fatty Acids (FISH OIL) 1000 MG CAPS Take by mouth.     propranolol  (INDERAL ) 10 MG tablet Take 1 tablet (10 mg total) by mouth 3 (three) times daily as needed. 270 tablet 3   simvastatin  (ZOCOR )  20 MG tablet Take 1 tablet (20 mg total) by mouth at bedtime. 90 tablet 3   solifenacin  (VESICARE ) 10 MG tablet Take 1 tablet (10 mg total) by mouth daily. 90 tablet 3   White Petrolatum-Mineral Oil (GENTEAL TEARS NIGHT-TIME) OINT Apply to eye.     mirtazapine  (REMERON ) 7.5 MG tablet Take 1 tablet (7.5 mg total) by mouth at bedtime. 90 tablet 3   No current facility-administered medications for this visit.   Facility-Administered Medications Ordered in Other Visits  Medication Dose Route Frequency Provider Last Rate Last Admin   diphenhydrAMINE  (BENADRYL ) injection 25 mg  25 mg Intravenous Once Ebbie Cough, MD         Musculoskeletal: Strength & Muscle Tone: within normal limits Gait & Station: normal Patient leans: N/A  Psychiatric Specialty Exam: Review of Systems  Psychiatric/Behavioral: Negative.      Blood pressure 120/82, pulse 81, temperature 98.3 F (36.8 C), temperature source Temporal, height 5' 5 (1.651 m), weight 157 lb 3.2 oz (71.3 kg), SpO2 94%.Body mass index is 26.16 kg/m.  General Appearance: Casual  Eye Contact:  Fair  Speech:  Clear and Coherent  Volume:  Normal  Mood:  Euthymic  Affect:  Congruent  Thought Process:  Goal Directed and Descriptions of Associations: Intact  Orientation:  Full (Time, Place, and Person)  Thought Content: Logical   Suicidal Thoughts:  No  Homicidal Thoughts:  No  Memory:  Immediate;   Fair Recent;   Fair Remote;   Fair  Judgement:  Fair  Insight:  Fair  Psychomotor Activity:  Normal  Concentration:  Concentration: Fair and Attention Span: Fair  Recall:  [employer] of Knowledge: Fair  Language: Fair  Akathisia:  No  Handed:  Right  AIMS (if indicated): not done  Assets:  Communication Skills Desire for Improvement Housing Social Support Transportation  ADL's:  Intact  Cognition: WNL  Sleep:  Fair   Screenings: AIMS    Flowsheet Row Office Visit from 07/26/2023 in [facility]  AIMS Total Score 0   GAD-7    Flowsheet Row Office Visit from 07/21/2024 in [facility] Office Visit from 07/26/2023 in [facility] Office Visit from 02/08/2023 in [facility] Video Visit from 08/01/2022 in [facility] Video Visit from 02/02/2022 in [facility]  Total GAD-7 Score 2 5 0 3 6   PHQ2-9    Flowsheet Row Office Visit from 07/21/2024 in [facility] Clinical Support from 12/14/2023 in [facility] Office Visit from 07/26/2023 in [facility] Video Visit from 04/04/2023 in [facility] Office Visit from 02/08/2023 in [facility]  PHQ-2 Total Score 0 0 0 0 0  PHQ-9 Total Score -- -- -- -- 1   Flowsheet Row Office Visit from 07/21/2024 in [facility] Video Visit from 01/21/2024 in [facility] Office Visit from 07/26/2023 in [facility]  C-SSRS RISK CATEGORY No Risk No Risk No Risk     Assessment and Plan: Ardena Staup is a 73 year old Caucasian female who is married, retired, has a history of PTSD, anxiety was evaluated in office today for a follow-up appointment.  Discussed assessment and plan as noted below.  Generalized anxiety disorder-stable PTSD-stable Currently reports overall mood symptoms as managed with therapy with Ms. Ellouise Hummer as well as current medication regimen. Continue Mirtazapine  7.5 mg at bedtime Continue Propranolol  10 mg 3 times a day as needed Continue CBT with Ms. Ellouise Hummer   Follow-up Follow-up in clinic in 6 months or sooner if  needed.  Collaboration of Care: Collaboration of Care: Referral or follow-up with counselor/therapist AEB encouraged to continue psychotherapy sessions.  Patient/Guardian was advised Release of Information must be obtained prior to any record release in order to collaborate their care with an outside provider. Patient/Guardian was advised if they have not already done so to contact the registration department to sign all necessary forms in order for us  to release information regarding their care.   Consent: Patient/Guardian gives verbal consent for treatment and assignment of benefits for services provided during this visit. Patient/Guardian expressed understanding and agreed to proceed.  This note was generated in part or whole with voice recognition software. Voice recognition is usually quite accurate but there are transcription errors that can and very often do occur. I apologize for any typographical errors that were not detected and corrected.     Margean Korell, MD 07/22/2024, 8:19 AM

## 2024-07-28 ENCOUNTER — Encounter

## 2024-07-28 ENCOUNTER — Ambulatory Visit
Admission: RE | Admit: 2024-07-28 | Discharge: 2024-07-28 | Disposition: A | Discharge status: Home / Self Care | Source: Ambulatory Visit | Attending: Family Medicine | Admitting: Family Medicine

## 2024-07-28 DIAGNOSIS — Z1231 Encounter for screening mammogram for malignant neoplasm of breast: Secondary | ICD-10-CM

## 2024-07-28 DIAGNOSIS — Z780 Asymptomatic menopausal state: Secondary | ICD-10-CM | POA: Diagnosis present

## 2024-07-30 ENCOUNTER — Ambulatory Visit: Payer: Self-pay | Admitting: Family Medicine

## 2024-08-01 ENCOUNTER — Ambulatory Visit: Payer: Self-pay | Admitting: Family Medicine

## 2024-10-02 ENCOUNTER — Other Ambulatory Visit: Payer: Self-pay | Admitting: Podiatry

## 2024-10-02 MED ORDER — IBUPROFEN 600 MG PO TABS: 600.0000 mg | ORAL_TABLET | Freq: Four times a day (QID) | ORAL | 0 refills | Status: AC | PRN

## 2024-10-02 MED ORDER — TRAMADOL HCL 50 MG PO TABS: 50.0000 mg | ORAL_TABLET | Freq: Four times a day (QID) | ORAL | 0 refills | Status: AC | PRN

## 2024-10-02 MED ORDER — ACETAMINOPHEN 500 MG PO TABS: 1000.0000 mg | ORAL_TABLET | Freq: Four times a day (QID) | ORAL | 0 refills | Status: AC | PRN

## 2024-10-03 DIAGNOSIS — M2041 Other hammer toe(s) (acquired), right foot: Secondary | ICD-10-CM | POA: Diagnosis not present

## 2024-10-08 ENCOUNTER — Ambulatory Visit (INDEPENDENT_AMBULATORY_CARE_PROVIDER_SITE_OTHER): Admitting: Podiatry

## 2024-10-08 ENCOUNTER — Ambulatory Visit (INDEPENDENT_AMBULATORY_CARE_PROVIDER_SITE_OTHER)

## 2024-10-08 VITALS — Ht 65.0 in | Wt 157.2 lb

## 2024-10-08 DIAGNOSIS — M2041 Other hammer toe(s) (acquired), right foot: Secondary | ICD-10-CM

## 2024-10-08 NOTE — Progress Notes (Signed)
  Subjective:  Patient ID: [name], female    DOB: 12/26/1950,  MRN: [id number]  Chief Complaint  Patient presents with   Post-op Follow-up    RM 2 POV # 1 DOS 10/24 RT 4TH TOE CORRECTION W/ BONE REMOVAl. Surgical site healing well, all sutures are intact with moderate swelling.      73 y.o. female returns for post-op check.  Pain is managed only taking ibuprofen minimally now  Review of Systems: Negative except as noted in the HPI. Denies N/V/F/Ch.   Objective:  There were no vitals filed for this visit. Body mass index is 26.16 kg/m. Constitutional Well developed. Well nourished.  Vascular Foot warm and well perfused. Capillary refill normal to all digits.  Calf is soft and supple, no posterior calf or knee pain, negative Homans' sign  Neurologic Normal speech. Oriented to person, place, and time. Epicritic sensation to light touch grossly present bilaterally.  Dermatologic Skin healing well without signs of infection. Skin edges well coapted without signs of infection.  Orthopedic: Tenderness to palpation noted about the surgical site.   Multiple view plain film radiographs: Good correction noted pin in appropriate position with good correction Assessment:   1. Hammertoe of right foot    Plan:  Patient was evaluated and treated and all questions answered.  S/p foot surgery right -Progressing as expected post-operatively. -XR: Noted above no issues -WB Status: WBAT in surgical shoe -Sutures: Plan to remove in 2 weeks. -Medications: No refills required -Foot redressed.  No follow-ups on file.

## 2024-10-22 ENCOUNTER — Ambulatory Visit (INDEPENDENT_AMBULATORY_CARE_PROVIDER_SITE_OTHER): Admitting: Podiatry

## 2024-10-22 VITALS — Ht 65.0 in | Wt 157.2 lb

## 2024-10-22 DIAGNOSIS — M2041 Other hammer toe(s) (acquired), right foot: Secondary | ICD-10-CM

## 2024-10-22 NOTE — Progress Notes (Signed)
  Subjective:  Patient ID: [name], female    DOB: 1951-05-11,  MRN: [id number]  Chief Complaint  Patient presents with   Post-op Follow-up    RM 3 POV # 2 DOS 10/24 RT 4TH TOE CORRECTION W/ BONE REMOVAL. Pt states improvement in pain. Surgical site healing well, sutures are intact with moderate swelling of toe.       73 y.o. female returns for post-op check.    Review of Systems: Negative except as noted in the HPI. Denies N/V/F/Ch.   Objective:  There were no vitals filed for this visit. Body mass index is 26.16 kg/m. Constitutional Well developed. Well nourished.  Vascular Foot warm and well perfused. Capillary refill normal to all digits.  Calf is soft and supple, no posterior calf or knee pain, negative Homans' sign  Neurologic Normal speech. Oriented to person, place, and time. Epicritic sensation to light touch grossly present bilaterally.  Dermatologic Skin healing well without signs of infection. Skin edges well coapted without signs of infection.  Orthopedic: No pain to palpation noted about the surgical site.   Multiple view plain film radiographs: Good correction noted pin in appropriate position with good correction Assessment:   1. Hammertoe of right foot    Plan:  Patient was evaluated and treated and all questions answered.  S/p foot surgery right - Sutures removed uneventfully.  Continue weightbearing in surgical shoe.  May wash foot and apply Neosporin before-and-after to pin site.  Follow-up with me in 3 weeks for new x-rays and pin removal.  No follow-ups on file.

## 2024-10-30 ENCOUNTER — Ambulatory Visit (INDEPENDENT_AMBULATORY_CARE_PROVIDER_SITE_OTHER): Payer: PRIVATE HEALTH INSURANCE | Admitting: Dermatology

## 2024-10-30 DIAGNOSIS — L821 Other seborrheic keratosis: Secondary | ICD-10-CM | POA: Diagnosis not present

## 2024-10-30 DIAGNOSIS — Z85828 Personal history of other malignant neoplasm of skin: Secondary | ICD-10-CM

## 2024-10-30 DIAGNOSIS — L578 Other skin changes due to chronic exposure to nonionizing radiation: Secondary | ICD-10-CM

## 2024-10-30 DIAGNOSIS — L820 Inflamed seborrheic keratosis: Secondary | ICD-10-CM

## 2024-10-30 DIAGNOSIS — D229 Melanocytic nevi, unspecified: Secondary | ICD-10-CM

## 2024-10-30 DIAGNOSIS — Z1283 Encounter for screening for malignant neoplasm of skin: Secondary | ICD-10-CM

## 2024-10-30 DIAGNOSIS — W908XXA Exposure to other nonionizing radiation, initial encounter: Secondary | ICD-10-CM

## 2024-10-30 DIAGNOSIS — L988 Other specified disorders of the skin and subcutaneous tissue: Secondary | ICD-10-CM

## 2024-10-30 DIAGNOSIS — L814 Other melanin hyperpigmentation: Secondary | ICD-10-CM | POA: Diagnosis not present

## 2024-10-30 NOTE — Progress Notes (Signed)
 Follow-Up Visit   Subjective  [name] is a 73 y.o. female who presents for the following: Skin Cancer Screening and Full Body Skin Exam, hx of BCC on the left thigh.   Patient here for Botox for facial elastosis- 4 months follow up.    The patient presents for Total-Body Skin Exam (TBSE) for skin cancer screening and mole check. The patient has spots, moles and lesions to be evaluated, some may be new or changing and the patient may have concern these could be cancer.    The following portions of the chart were reviewed this encounter and updated as appropriate: medications, allergies, medical history  Review of Systems:  No other skin or systemic complaints except as noted in HPI or Assessment and Plan.  Objective  Well appearing patient in no apparent distress; mood and affect are within normal limits.  A full examination was performed including scalp, head, eyes, ears, nose, lips, neck, chest, axillae, abdomen, back, buttocks, bilateral upper extremities, bilateral lower extremities, hands, feet, fingers, toes, fingernails, and toenails. All findings within normal limits unless otherwise noted below.   Relevant physical exam findings are noted in the Assessment and Plan.  right medial chest x 1 Stuck-on, waxy, tan-brown papules and plaques -- Discussed benign etiology and prognosis.   Assessment & Plan   SKIN CANCER SCREENING PERFORMED TODAY.  ACTINIC DAMAGE - Chronic condition, secondary to cumulative UV/sun exposure - diffuse scaly erythematous macules with underlying dyspigmentation - Recommend daily broad spectrum sunscreen SPF 30+ to sun-exposed areas, reapply every 2 hours as needed.  - Staying in the shade or wearing long sleeves, sun glasses (UVA+UVB protection) and wide brim hats (4-inch brim around the entire circumference of the hat) are also recommended for sun protection.  - Call for new or changing lesions.  LENTIGINES, SEBORRHEIC KERATOSES, HEMANGIOMAS  - Benign normal skin lesions - Benign-appearing - Call for any changes  MELANOCYTIC NEVI - Tan-brown and/or pink-flesh-colored symmetric macules and papules - Benign appearing on exam today - Observation - Call clinic for new or changing moles - Recommend daily use of broad spectrum spf 30+ sunscreen to sun-exposed areas.   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  Informed consent was obtained.   Preparation: The area was cleansed with alcohol.   Procedure Details:  Botox was injected into the dermis with a 30-gauge needle. Pressure applied to any bleeding. Ice packs offered for swelling.   Lot Number:  [id number] Expiration:  10/2026   Total Units Injected:  20   Plan: Tylenol  may be used for headache.  Allow 2 weeks before returning to clinic for additional dosing as needed. Patient will call for any problems.    INFLAMED SEBORRHEIC KERATOSIS right medial chest x 1 Symptomatic, irritating, patient would like treated.   Recheck at next office visi Destruction of lesion -  right medial chest x 1 Complexity: simple   Destruction method: cryotherapy   Informed consent: discussed and consent obtained   Timeout:  patient name, date of birth, surgical site, and procedure  verified Lesion destroyed using liquid nitrogen: Yes   Region frozen until ice ball extended beyond lesion: Yes   Outcome: patient tolerated procedure well with no complications   Post-procedure details: wound care instructions given      Return in about 1 year (around 10/30/2025) for TBSE, hx of BCC and Botox in 3-4 months .  IFay Kirks, CMA, am acting as scribe for Alm Rhyme, MD .   Documentation: I have reviewed the above documentation for accuracy and completeness, and I agree with the above.  Alm Rhyme, MD

## 2024-10-30 NOTE — Patient Instructions (Signed)

## 2024-11-03 ENCOUNTER — Encounter: Payer: Self-pay | Admitting: Dermatology

## 2024-11-11 ENCOUNTER — Ambulatory Visit: Admitting: Dermatology

## 2024-11-12 ENCOUNTER — Encounter: Payer: Self-pay | Admitting: Podiatry

## 2024-11-12 ENCOUNTER — Ambulatory Visit (INDEPENDENT_AMBULATORY_CARE_PROVIDER_SITE_OTHER): Admitting: Podiatry

## 2024-11-12 DIAGNOSIS — M2041 Other hammer toe(s) (acquired), right foot: Secondary | ICD-10-CM

## 2024-11-12 NOTE — Progress Notes (Signed)
  Subjective:  Patient ID: [name], female    DOB: 11-12-1951,  MRN: [id number]  Chief Complaint  Patient presents with   Routine Post Op    POV # 3 DOS 10/24 RT 4TH TOE CORRECTION W/ BONE REMOVAL. Minimal pain. Swelling present      73 y.o. female returns for post-op check.    Review of Systems: Negative except as noted in the HPI. Denies N/V/F/Ch.   Objective:  There were no vitals filed for this visit. There is no height or weight on file to calculate BMI. Constitutional Well developed. Well nourished.  Vascular Foot warm and well perfused. Capillary refill normal to all digits.  Calf is soft and supple, no posterior calf or knee pain, negative Homans' sign  Neurologic Normal speech. Oriented to person, place, and time. Epicritic sensation to light touch grossly present bilaterally.  Dermatologic Skin healing well without signs of infection. Skin edges well coapted without signs of infection.  Orthopedic: No pain to palpation noted about the surgical site.   Multiple view plain film radiographs: Good correction noted pin in appropriate position with good correction Assessment:   1. Hammertoe of right foot    Plan:  Patient was evaluated and treated and all questions answered.  S/p foot surgery right - Pin removed uneventfully may return to regular shoe gear and activity as tolerated.  Follow-up in 6 weeks for new x-rays.  Return in about 6 weeks (around 12/24/2024) for post op (new x-rays).

## 2024-12-15 ENCOUNTER — Other Ambulatory Visit: Payer: Self-pay | Admitting: Family Medicine

## 2024-12-16 ENCOUNTER — Telehealth: Payer: Self-pay | Admitting: Family Medicine

## 2024-12-16 ENCOUNTER — Ambulatory Visit (INDEPENDENT_AMBULATORY_CARE_PROVIDER_SITE_OTHER): Payer: Medicare Other

## 2024-12-16 VITALS — BP 108/62 | Ht 65.0 in | Wt 160.0 lb

## 2024-12-16 DIAGNOSIS — E785 Hyperlipidemia, unspecified: Secondary | ICD-10-CM

## 2024-12-16 DIAGNOSIS — Z0000 Encounter for general adult medical examination without abnormal findings: Secondary | ICD-10-CM | POA: Diagnosis not present

## 2024-12-16 NOTE — Patient Instructions (Signed)
 [name],  Thank you for taking the time for your Medicare Wellness Visit. I appreciate your continued commitment to your health goals. Please review the care plan we discussed, and feel free to reach out if I can assist you further.  Please note that Annual Wellness Visits do not include a physical exam. Some assessments may be limited, especially if the visit was conducted virtually. If needed, we may recommend an in-person follow-up with your provider.  Ongoing Care Seeing your primary care provider every 3 to 6 months helps us  monitor your health and provide consistent, personalized care.   Referrals If a referral was made during today's visit and you haven't received any updates within two weeks, please contact the referred provider directly to check on the status.  Recommended Screenings:  Health Maintenance  Topic Date Due   Medicare Annual Wellness Visit  12/13/2024   COVID-19 Vaccine (9 - Pfizer risk 2025-26 season) 02/15/2025   Colon Cancer Screening  06/06/2026   Breast Cancer Screening  07/28/2026   DTaP/Tdap/Td vaccine (2 - Td or Tdap) 10/09/2026   Pneumococcal Vaccine for age over 81  Completed   Flu Shot  Completed   Osteoporosis screening with Bone Density Scan  Completed   Hepatitis C Screening  Completed   Zoster (Shingles) Vaccine  Completed   Meningitis B Vaccine  Aged Out       12/12/2024   11:53 AM  Advanced Directives  Does Patient Have a Medical Advance Directive? Yes  Type of Estate Agent of [place];Living will  Does patient want to make changes to medical advance directive? No - Patient declined  Copy of Healthcare Power of Attorney in Chart? No - copy requested    Vision: Annual vision screenings are recommended for early detection of glaucoma, cataracts, and diabetic retinopathy. These exams can also reveal signs of chronic conditions such as diabetes and high blood pressure.  Dental: Annual dental screenings help detect  early signs of oral cancer, gum disease, and other conditions linked to overall health, including heart disease and diabetes.

## 2024-12-16 NOTE — Telephone Encounter (Signed)
 Please notify pt. I put in the orders for [facility]. Thanks.

## 2024-12-16 NOTE — Progress Notes (Signed)
 "  Chief Complaint  Patient presents with   Medicare Wellness     Subjective:   [name] is a 74 y.o. female who presents for a Medicare Annual Wellness Visit.  Visit info / Clinical Intake: Medicare Wellness Visit Type:: Subsequent Annual Wellness Visit Persons participating in visit and providing information:: patient Medicare Wellness Visit Mode:: In-person (required for WTM) Interpreter Needed?: No Pre-visit prep was completed: yes AWV questionnaire completed by patient prior to visit?: yes Date:: 12/12/24 Living arrangements:: (Patient-Rptd) lives with spouse/significant other Patient's Overall Health Status Rating: (Patient-Rptd) very good Typical amount of pain: (Patient-Rptd) some Does pain affect daily life?: (Patient-Rptd) no Are you currently prescribed opioids?: no  Dietary Habits and Nutritional Risks How many meals a day?: (Patient-Rptd) 3 Eats fruit and vegetables daily?: (Patient-Rptd) yes Most meals are obtained by: (Patient-Rptd) preparing own meals In the last 2 weeks, have you had any of the following?: none Diabetic:: no  Functional Status Activities of Daily Living (to include ambulation/medication): (Patient-Rptd) Independent Ambulation: (Patient-Rptd) Independent Medication Administration: (Patient-Rptd) Independent Home Management (perform basic housework or laundry): (Patient-Rptd) Independent Manage your own finances?: (Patient-Rptd) yes Primary transportation is: (Patient-Rptd) driving Concerns about vision?: no *vision screening is required for WTM* Concerns about hearing?: no  Fall Screening Falls in the past year?: (Patient-Rptd) 0 Number of falls in past year: 0 Was there an injury with Fall?: 0 Fall Risk Category Calculator: 0 Patient Fall Risk Level: Low Fall Risk  Fall Risk Patient at Risk for Falls Due to: No Fall Risks Fall risk Follow up: Falls evaluation completed; Education provided; Falls prevention discussed; Follow up  appointment  Home and Transportation Safety: All rugs have non-skid backing?: (Patient-Rptd) yes All stairs or steps have railings?: (Patient-Rptd) yes Grab bars in the bathtub or shower?: (Patient-Rptd) yes Have non-skid surface in bathtub or shower?: (Patient-Rptd) yes Good home lighting?: (Patient-Rptd) yes Regular seat belt use?: (Patient-Rptd) yes Hospital stays in the last year:: (Patient-Rptd) no  Cognitive Assessment Difficulty concentrating, remembering, or making decisions? : (Patient-Rptd) no Will 6CIT or Mini Cog be Completed: yes What year is it?: 0 points What month is it?: 0 points Give patient an address phrase to remember (5 components): [address] [place]  About what time is it?: 0 points Count backwards from 20 to 1: 0 points Say the months of the year in reverse: 0 points Repeat the address phrase from earlier: 0 points 6 CIT Score: 0 points  Advance Directives (For Healthcare) Does Patient Have a Medical Advance Directive?: Yes Does patient want to make changes to medical advance directive?: No - Patient declined Type of Advance Directive: Healthcare Power of [place]; Living will Copy of Healthcare Power of Attorney in Chart?: No - copy requested Copy of Living Will in Chart?: No - copy requested  Reviewed/Updated  Reviewed/Updated: Reviewed All (Medical, Surgical, Family, Medications, Allergies, Care Teams, Patient Goals)    Allergies (verified) Codeine, Iodinated contrast media, Metrizamide, Latex, and Vancomycin    Current Medications (verified) Outpatient Encounter Medications as of 12/16/2024  Medication Sig   acyclovir  (ZOVIRAX ) 200 MG capsule Take 1 capsule (200 mg total) by mouth 3 (three) times daily as needed.   doxycycline  (VIBRAMYCIN ) 50 MG capsule Take 1 capsule (50 mg total) by mouth 2 (two) times daily.   ibuprofen  (ADVIL ,MOTRIN ) 200 MG tablet Take 200 mg by mouth every 6 (six) hours as needed for moderate pain.   levothyroxine   (SYNTHROID ) 75 MCG tablet Take 1 tablet (75 mcg total) by mouth daily before  breakfast.   mirtazapine  (REMERON ) 7.5 MG tablet Take 1 tablet (7.5 mg total) by mouth at bedtime.   Multiple Vitamins-Minerals (OCUVITE ADULT 50+ PO) Take by mouth.   Omega-3 Fatty Acids (FISH OIL) 1000 MG CAPS Take by mouth.   propranolol  (INDERAL ) 10 MG tablet Take 1 tablet (10 mg total) by mouth 3 (three) times daily as needed.   simvastatin  (ZOCOR ) 20 MG tablet Take 1 tablet (20 mg total) by mouth at bedtime.   solifenacin  (VESICARE ) 10 MG tablet Take 1 tablet (10 mg total) by mouth daily.   White Petrolatum-Mineral Oil (GENTEAL TEARS NIGHT-TIME) OINT Apply to eye.   Facility-Administered Encounter Medications as of 12/16/2024  Medication   diphenhydrAMINE  (BENADRYL ) injection 25 mg    History: Past Medical History:  Diagnosis Date   Allergy    Anxiety    Arthritis    Neck, back, hands   Basal cell carcinoma    L thigh, txted ~1990 in [place]    Hyperlipidemia    First noted 2015   Rosacea    ocular, on chronic doxycycline  treatment   Thyroid  disease    Past Surgical History:  Procedure Laterality Date   ABDOMINAL HYSTERECTOMY  1992   Total   BREAST BIOPSY Right 07/20/2015   neg fat necrosis   BREAST BIOPSY Left 01/24/2023   US  Bx, Venus Clip, Path pending   BREAST BIOPSY Left 01/24/2023   US  LT BREAST BX W LOC DEV 1ST LESION IMG BX SPEC US  GUIDE 01/24/2023 [facility]   BREAST BIOPSY  03/13/2023   MM LT RADIOACTIVE SEED LOC MAMMO GUIDE 03/13/2023 [facility]   BREAST EXCISIONAL BIOPSY Right 1960's   negative. No scar seen   CERVICAL FUSION  2005,2010   C2-C7   COSMETIC SURGERY     Breast reduction 2015   EYE SURGERY     RK 1990s   RADIOACTIVE SEED GUIDED EXCISIONAL BREAST BIOPSY Left 03/13/2023   Procedure: RADIOACTIVE SEED GUIDED EXCISIONAL LEFT BREAST BIOPSY;  Surgeon: Ebbie Cough, MD;  Location: [facility];  Service: General;  Laterality: Left;    REDUCTION MAMMAPLASTY Bilateral 2016   SPINE SURGERY     Fusions x 3 (C2 through C7)   TONSILLECTOMY  1961   TUBAL LIGATION     Family History  Problem Relation Age of Onset   Ovarian cancer Mother 3   Anxiety disorder Mother    Depression Mother    Hyperlipidemia Mother    Heart disease Father    Stroke Father    Diabetes Father    Hyperlipidemia Father    Hypertension Father    Lung cancer Maternal Uncle        d. 47; smoking hx   Cancer Maternal Grandmother 88       ovarian or uterine?   ADD / ADHD Daughter    ADD / ADHD Grandchild    Melanoma Cousin        mat female cousin; dx 68s; back   Cancer Cousin        mat female cousins x2; unknown primary w/ mets   Prostate cancer Neg Hx    Kidney cancer Neg Hx    Breast cancer Neg Hx    Colon cancer Neg Hx    Social History   Occupational History    Comment: retired  Tobacco Use   Smoking status: Never   Smokeless tobacco: Never  Vaping Use   Vaping status: Never Used  Substance and Sexual Activity   Alcohol use:  Not Currently    Comment: none   Drug use: No   Sexual activity: Yes    Birth control/protection: None   Tobacco Counseling Counseling given: Not Answered  SDOH Screenings   Food Insecurity: No Food Insecurity (12/12/2024)  Housing: Low Risk (12/12/2024)  Transportation Needs: No Transportation Needs (12/12/2024)  Utilities: Not At Risk (12/16/2024)  Alcohol Screen: Low Risk (12/12/2024)  Depression (PHQ2-9): Low Risk (12/16/2024)  Financial Resource Strain: Low Risk (12/12/2024)  Physical Activity: Sufficiently Active (12/12/2024)  Social Connections: Socially Integrated (12/12/2024)  Stress: No Stress Concern Present (12/12/2024)  Tobacco Use: Low Risk (12/16/2024)  Health Literacy: Adequate Health Literacy (12/16/2024)   See flowsheets for full screening details  Depression Screen PHQ 2 & 9 Depression Scale- Over the past 2 weeks, how often have you been bothered by any of the following problems? Little interest  or pleasure in doing things: 0 Feeling down, depressed, or hopeless (PHQ Adolescent also includes...irritable): 0 PHQ-2 Total Score: 0     Goals Addressed             This Visit's Progress    I would like to stay mentally healthy and involved in social activities       Patient Stated   On track    I just want to stay active and minimize the impact of aging             Objective:    Today's Vitals   12/16/24 1513  Weight: 160 lb (72.6 kg)  Height: 5' 5 (1.651 m)   Body mass index is 26.63 kg/m.  Hearing/Vision screen Vision Screening - Comments:: UTD w/visits to [employer] and Health Maintenance Health Maintenance  Topic Date Due   Medicare Annual Wellness (AWV)  12/13/2024   COVID-19 Vaccine (9 - Pfizer risk 2025-26 season) 02/15/2025   Colonoscopy  06/06/2026   Mammogram  07/28/2026   DTaP/Tdap/Td (2 - Td or Tdap) 10/09/2026   Pneumococcal Vaccine: 50+ Years  Completed   Influenza Vaccine  Completed   Bone Density Scan  Completed   Hepatitis C Screening  Completed   Zoster Vaccines- Shingrix  Completed   Meningococcal B Vaccine  Aged Out        Assessment/Plan:  This is a routine wellness examination for [name].  Patient Care Team: Cleatus Arlyss RAMAN, MD as PCP - [facility] ([facility], [facility]  I have personally reviewed and noted the following in the patients chart:   Medical and social history Use of alcohol, tobacco or illicit drugs  Current medications and supplements including opioid prescriptions. Functional ability and status Nutritional status Physical activity Advanced directives List of other physicians Hospitalizations, surgeries, and ER visits in previous 12 months Vitals Screenings to include cognitive, depression, and falls Referrals and appointments  No orders of the defined types were placed in this encounter.  In addition, I have reviewed and discussed with patient certain preventive  protocols, quality metrics, and best practice recommendations. A written personalized care plan for preventive services as well as general preventive health recommendations were provided to patient.   Erminio LITTIE Saris, LPN   07/13/7972   No follow-ups on file.  After Visit Summary: (In Person-Declined) Patient declined AVS at this time.Pt will view in mychart  Nurse Notes: No voiced or noted concerns at this time Patient advised to keep follow-up appointment with PCP (Feb 2026) Appointment(s) made: (AWV/CPE Feb 2027)  Pt desires to get labs done prior to CPE I scheduled at  AWV.. She goes to [facility] in [place]. She needs orders. Thank you!Erminio "

## 2024-12-17 NOTE — Telephone Encounter (Signed)
 Pt is aware that lab orders have been entered.  She plans to have them done 1 week before her next appt with Dr. Cleatus.

## 2024-12-19 ENCOUNTER — Other Ambulatory Visit: Payer: Self-pay | Admitting: Family Medicine

## 2024-12-20 ENCOUNTER — Other Ambulatory Visit: Payer: Self-pay | Admitting: Family Medicine

## 2024-12-20 DIAGNOSIS — E785 Hyperlipidemia, unspecified: Secondary | ICD-10-CM

## 2024-12-24 ENCOUNTER — Encounter: Admitting: Podiatry

## 2024-12-29 ENCOUNTER — Ambulatory Visit: Admitting: Podiatry

## 2024-12-29 VITALS — Ht 65.0 in | Wt 160.0 lb

## 2024-12-29 DIAGNOSIS — M2041 Other hammer toe(s) (acquired), right foot: Secondary | ICD-10-CM

## 2024-12-31 NOTE — Progress Notes (Signed)
"  °  Subjective:  Patient ID: [name], female    DOB: 12-03-1951,  MRN: [id number]  Chief Complaint  Patient presents with   Post-op Follow-up    Rm 9 Patient is here for post-op f/u hammertoe correction of the right foot. Pt states intermittent stinging pain when right foot brushes against covers at night.      74 y.o. female returns for post-op check.    Review of Systems: Negative except as noted in the HPI. Denies N/V/F/Ch.   Objective:  There were no vitals filed for this visit. Body mass index is 26.63 kg/m. Constitutional Well developed. Well nourished.  Vascular Foot warm and well perfused. Capillary refill normal to all digits.  Calf is soft and supple, no posterior calf or knee pain, negative Homans' sign  Neurologic Normal speech. Oriented to person, place, and time. Epicritic sensation to light touch grossly present bilaterally.  Dermatologic Incision is well-healed without hypertrophy  Orthopedic: No pain to palpation noted about the surgical site.  Still mild edema but no pain   Multiple view plain film radiographs: Correction is maintained Assessment:   1. Hammertoe of right foot    Plan:  Patient was evaluated and treated and all questions answered.  S/p foot surgery right Activity and shoe gear as tolerated.  Discussed some neuritis numbness and scar tissue will take some time to fully resolve.  Follow-up as needed. Return if symptoms worsen or fail to improve.  "

## 2025-01-03 LAB — CBC WITH DIFFERENTIAL/PLATELET
Basophils Absolute: 0 10*3/uL (ref 0.0–0.2)
Basos: 1 %
EOS (ABSOLUTE): 0.1 10*3/uL (ref 0.0–0.4)
Eos: 2 %
Hematocrit: 43.6 % (ref 34.0–46.6)
Hemoglobin: 14.5 g/dL (ref 11.1–15.9)
Immature Grans (Abs): 0 10*3/uL (ref 0.0–0.1)
Immature Granulocytes: 0 %
Lymphocytes Absolute: 2.2 10*3/uL (ref 0.7–3.1)
Lymphs: 37 %
MCH: 31.9 pg (ref 26.6–33.0)
MCHC: 33.3 g/dL (ref 31.5–35.7)
MCV: 96 fL (ref 79–97)
Monocytes Absolute: 0.4 10*3/uL (ref 0.1–0.9)
Monocytes: 7 %
Neutrophils Absolute: 3.1 10*3/uL (ref 1.4–7.0)
Neutrophils: 53 %
Platelets: 158 10*3/uL (ref 150–450)
RBC: 4.54 x10E6/uL (ref 3.77–5.28)
RDW: 12.3 % (ref 11.7–15.4)
WBC: 5.8 10*3/uL (ref 3.4–10.8)

## 2025-01-03 LAB — COMPREHENSIVE METABOLIC PANEL WITH GFR
ALT: 20 [IU]/L (ref 0–32)
AST: 26 [IU]/L (ref 0–40)
Albumin: 4.5 g/dL (ref 3.8–4.8)
Alkaline Phosphatase: 96 [IU]/L (ref 49–135)
BUN/Creatinine Ratio: 22 (ref 12–28)
BUN: 17 mg/dL (ref 8–27)
Bilirubin Total: 0.3 mg/dL (ref 0.0–1.2)
CO2: 23 mmol/L (ref 20–29)
Calcium: 9.5 mg/dL (ref 8.7–10.3)
Chloride: 105 mmol/L (ref 96–106)
Creatinine, Ser: 0.76 mg/dL (ref 0.57–1.00)
Globulin, Total: 1.9 g/dL (ref 1.5–4.5)
Glucose: 86 mg/dL (ref 70–99)
Potassium: 4.2 mmol/L (ref 3.5–5.2)
Sodium: 143 mmol/L (ref 134–144)
Total Protein: 6.4 g/dL (ref 6.0–8.5)
eGFR: 84 mL/min/{1.73_m2}

## 2025-01-03 LAB — LIPID PANEL
Chol/HDL Ratio: 2.3 ratio (ref 0.0–4.4)
Cholesterol, Total: 189 mg/dL (ref 100–199)
HDL: 81 mg/dL
LDL Chol Calc (NIH): 94 mg/dL (ref 0–99)
Triglycerides: 77 mg/dL (ref 0–149)
VLDL Cholesterol Cal: 14 mg/dL (ref 5–40)

## 2025-01-03 LAB — TSH: TSH: 2.85 u[IU]/mL (ref 0.450–4.500)

## 2025-01-07 ENCOUNTER — Ambulatory Visit: Payer: Self-pay | Admitting: Family Medicine

## 2025-01-13 ENCOUNTER — Encounter: Payer: Self-pay | Admitting: Family Medicine

## 2025-01-13 ENCOUNTER — Ambulatory Visit: Admitting: Family Medicine

## 2025-01-13 VITALS — BP 110/60 | HR 71 | Temp 98.1°F | Ht 64.75 in | Wt 160.1 lb

## 2025-01-13 DIAGNOSIS — G4700 Insomnia, unspecified: Secondary | ICD-10-CM

## 2025-01-13 DIAGNOSIS — Z7189 Other specified counseling: Secondary | ICD-10-CM

## 2025-01-13 DIAGNOSIS — M25519 Pain in unspecified shoulder: Secondary | ICD-10-CM | POA: Diagnosis not present

## 2025-01-13 DIAGNOSIS — L718 Other rosacea: Secondary | ICD-10-CM | POA: Diagnosis not present

## 2025-01-13 DIAGNOSIS — E785 Hyperlipidemia, unspecified: Secondary | ICD-10-CM | POA: Diagnosis not present

## 2025-01-13 DIAGNOSIS — E039 Hypothyroidism, unspecified: Secondary | ICD-10-CM | POA: Diagnosis not present

## 2025-01-13 DIAGNOSIS — Z0000 Encounter for general adult medical examination without abnormal findings: Secondary | ICD-10-CM

## 2025-01-13 DIAGNOSIS — N3281 Overactive bladder: Secondary | ICD-10-CM

## 2025-01-13 MED ORDER — SOLIFENACIN SUCCINATE 10 MG PO TABS: 10.0000 mg | ORAL_TABLET | Freq: Every day | ORAL | 3 refills | Status: AC

## 2025-01-13 MED ORDER — POLYETHYLENE GLYCOL 3350 17 GM/SCOOP PO POWD: 17.0000 g | Freq: Every day | ORAL | Status: AC | PRN

## 2025-01-13 MED ORDER — SIMVASTATIN 20 MG PO TABS: 20.0000 mg | ORAL_TABLET | Freq: Every day | ORAL | 3 refills | Status: AC

## 2025-01-13 MED ORDER — ACYCLOVIR 200 MG PO CAPS: 200.0000 mg | ORAL_CAPSULE | Freq: Three times a day (TID) | ORAL | 1 refills | Status: AC | PRN

## 2025-01-13 MED ORDER — MELOXICAM 7.5 MG PO TABS: 7.5000 mg | ORAL_TABLET | Freq: Every day | ORAL | 1 refills | Status: AC | PRN

## 2025-01-13 MED ORDER — LEVOTHYROXINE SODIUM 75 MCG PO TABS: 75.0000 ug | ORAL_TABLET | Freq: Every day | ORAL | 3 refills | Status: AC

## 2025-01-13 MED ORDER — DOXYCYCLINE HYCLATE 50 MG PO CAPS: 50.0000 mg | ORAL_CAPSULE | Freq: Two times a day (BID) | ORAL | 3 refills | Status: AC

## 2025-01-13 NOTE — Progress Notes (Unsigned)
 Flu 2025 Shingles 2022 PNA previously done Tetanus 2017 RSV previously done  COVID-vaccine previously done Colonoscopy 2017 Breast cancer screening 2025 Bone density test 2025- wnl.  Advance directive-husband designated if patient were incapacitated.  She has some shoulder pain attributed to prev neck findings.  Taking meloxicam  prn, usually every other day.  She is trying to limit use. Cr wnl, d/w pt.  D/w pt about not using ibuprofen .    Remeron  used for sleep.  Sleeping better with use.  No ADE on med.    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     Hypothyroid.  On replacement.  No ADE on med.  No neck mass, no dysphagia.     Vesicare  helped overactive bladder symptoms with extra 1/2 tab if she has sig travel.  No dysuria unless she has caffeine, d/w pt. No ADE on med and d/w pt about continued use.   She started using colace for constipation with occ prn dulcolax.  D/w pt about trying miralax .     Elevated Cholesterol: Using medications without problems: yes Muscle aches: no Diet compliance: yes Exercise: yes She is exercising w/o chest pain.     She is not under treatment or surveillance based on prev lumpectomy results 2024.   Meds, vitals, and allergies reviewed.   ROS: Per HPI unless specifically indicated in ROS section   GEN: nad, alert and oriented HEENT: mucous membranes moist NECK: supple w/o LA CV: rrr.  no murmur PULM: ctab, no inc wob ABD: soft, +bs EXT: no edema SKIN: no acute rash

## 2025-01-15 DIAGNOSIS — L718 Other rosacea: Secondary | ICD-10-CM | POA: Insufficient documentation

## 2025-01-15 NOTE — Assessment & Plan Note (Signed)
 Flu 2025 Shingles 2022 PNA previously done Tetanus 2017 RSV previously done  COVID-vaccine previously done Colonoscopy 2017 Breast cancer screening 2025 Bone density test 2025- wnl.  Advance directive-husband designated if patient were incapacitated.

## 2025-01-15 NOTE — Assessment & Plan Note (Signed)
Advance directive- husband designated if patient were incapacitated.  

## 2025-01-15 NOTE — Assessment & Plan Note (Signed)
 Discussed trying meloxicam  as needed with GI cautions and she can update me as needed.

## 2025-01-15 NOTE — Assessment & Plan Note (Signed)
 Continue work on diet and exercise.  Continue simvastatin .

## 2025-01-15 NOTE — Assessment & Plan Note (Signed)
 Continue Vesicare  as is.  Unclear if Vesicare  is contributing to constipation.  She had good effect from medication otherwise.  She can use MiraLAX  as needed.

## 2025-01-15 NOTE — Assessment & Plan Note (Signed)
 Continue Doxy.  No ADE on med.  No symptoms currently.

## 2025-01-15 NOTE — Assessment & Plan Note (Signed)
 Remeron  used for sleep.  Sleeping better with use.  No ADE on med.   Continue as is.

## 2025-01-15 NOTE — Assessment & Plan Note (Signed)
 Continue on replacement.  No ADE on med.  No neck mass, no dysphagia.

## 2025-01-21 ENCOUNTER — Ambulatory Visit: Admitting: Psychiatry

## 2025-01-22 ENCOUNTER — Telehealth: Admitting: Psychiatry

## 2025-01-27 ENCOUNTER — Ambulatory Visit: Admitting: Dermatology

## 2025-11-12 ENCOUNTER — Ambulatory Visit: Admitting: Dermatology

## 2026-01-25 ENCOUNTER — Ambulatory Visit

## 2026-01-25 ENCOUNTER — Encounter: Admitting: Family Medicine
